# Patient Record
Sex: Male | Born: 1955 | Race: White | Hispanic: No | Marital: Single | Smoking: Current some day smoker
Health system: Southern US, Community
[De-identification: ages and names within clinical notes are randomized; demographics above are authoritative.]

## PROBLEM LIST (undated history)

## (undated) DIAGNOSIS — Z21 Asymptomatic human immunodeficiency virus [HIV] infection status: Secondary | ICD-10-CM

## (undated) DIAGNOSIS — I10 Essential (primary) hypertension: Secondary | ICD-10-CM

## (undated) DIAGNOSIS — B20 Human immunodeficiency virus [HIV] disease: Secondary | ICD-10-CM

## (undated) HISTORY — DX: Human immunodeficiency virus (HIV) disease: B20

## (undated) HISTORY — DX: Essential (primary) hypertension: I10

## (undated) HISTORY — DX: Asymptomatic human immunodeficiency virus (hiv) infection status: Z21

---

## 1998-04-18 HISTORY — PX: OTHER SURGICAL HISTORY: SHX169

## 2004-10-31 ENCOUNTER — Ambulatory Visit: Payer: Self-pay | Admitting: Infectious Diseases

## 2004-10-31 ENCOUNTER — Inpatient Hospital Stay (HOSPITAL_COMMUNITY): Admission: RE | Admit: 2004-10-31 | Discharge: 2004-11-04 | Payer: Self-pay | Admitting: Internal Medicine

## 2004-10-31 ENCOUNTER — Encounter: Payer: Self-pay | Admitting: *Deleted

## 2004-10-31 DIAGNOSIS — B957 Other staphylococcus as the cause of diseases classified elsewhere: Secondary | ICD-10-CM | POA: Insufficient documentation

## 2004-12-03 ENCOUNTER — Encounter (INDEPENDENT_AMBULATORY_CARE_PROVIDER_SITE_OTHER): Payer: Self-pay | Admitting: *Deleted

## 2004-12-03 ENCOUNTER — Ambulatory Visit: Payer: Self-pay | Admitting: Infectious Diseases

## 2004-12-26 ENCOUNTER — Inpatient Hospital Stay (HOSPITAL_COMMUNITY): Admission: EM | Admit: 2004-12-26 | Discharge: 2005-01-01 | Payer: Self-pay | Admitting: Emergency Medicine

## 2005-02-24 ENCOUNTER — Ambulatory Visit: Payer: Self-pay | Admitting: Infectious Diseases

## 2005-04-01 ENCOUNTER — Encounter (INDEPENDENT_AMBULATORY_CARE_PROVIDER_SITE_OTHER): Payer: Self-pay | Admitting: *Deleted

## 2005-04-01 ENCOUNTER — Ambulatory Visit: Payer: Self-pay | Admitting: Infectious Diseases

## 2005-04-01 ENCOUNTER — Ambulatory Visit (HOSPITAL_COMMUNITY): Admission: RE | Admit: 2005-04-01 | Discharge: 2005-04-01 | Payer: Self-pay | Admitting: Infectious Diseases

## 2005-04-01 LAB — CONVERTED CEMR LAB: HIV 1 RNA Quant: 399 copies/mL

## 2005-11-04 ENCOUNTER — Ambulatory Visit: Payer: Self-pay | Admitting: Internal Medicine

## 2005-11-04 ENCOUNTER — Encounter (INDEPENDENT_AMBULATORY_CARE_PROVIDER_SITE_OTHER): Payer: Self-pay | Admitting: *Deleted

## 2005-11-04 ENCOUNTER — Ambulatory Visit (HOSPITAL_COMMUNITY): Admission: RE | Admit: 2005-11-04 | Discharge: 2005-11-04 | Payer: Self-pay | Admitting: Internal Medicine

## 2005-11-04 ENCOUNTER — Encounter: Admission: RE | Admit: 2005-11-04 | Discharge: 2005-11-04 | Payer: Self-pay | Admitting: Internal Medicine

## 2005-11-04 LAB — CONVERTED CEMR LAB: HIV 1 RNA Quant: 49 copies/mL

## 2006-02-15 ENCOUNTER — Ambulatory Visit: Payer: Self-pay | Admitting: Internal Medicine

## 2006-03-16 ENCOUNTER — Encounter: Admission: RE | Admit: 2006-03-16 | Discharge: 2006-03-16 | Payer: Self-pay | Admitting: Internal Medicine

## 2006-03-16 ENCOUNTER — Ambulatory Visit: Payer: Self-pay | Admitting: Internal Medicine

## 2006-03-16 ENCOUNTER — Encounter (INDEPENDENT_AMBULATORY_CARE_PROVIDER_SITE_OTHER): Payer: Self-pay | Admitting: *Deleted

## 2006-03-29 ENCOUNTER — Ambulatory Visit: Payer: Self-pay | Admitting: Internal Medicine

## 2006-05-30 DIAGNOSIS — B20 Human immunodeficiency virus [HIV] disease: Secondary | ICD-10-CM

## 2006-05-30 DIAGNOSIS — I10 Essential (primary) hypertension: Secondary | ICD-10-CM | POA: Insufficient documentation

## 2006-05-30 DIAGNOSIS — K029 Dental caries, unspecified: Secondary | ICD-10-CM | POA: Insufficient documentation

## 2006-06-12 ENCOUNTER — Encounter (INDEPENDENT_AMBULATORY_CARE_PROVIDER_SITE_OTHER): Payer: Self-pay | Admitting: *Deleted

## 2006-06-12 LAB — CONVERTED CEMR LAB

## 2006-06-13 ENCOUNTER — Encounter: Payer: Self-pay | Admitting: Internal Medicine

## 2006-06-25 ENCOUNTER — Encounter (INDEPENDENT_AMBULATORY_CARE_PROVIDER_SITE_OTHER): Payer: Self-pay | Admitting: *Deleted

## 2006-08-30 ENCOUNTER — Encounter: Payer: Self-pay | Admitting: Licensed Clinical Social Worker

## 2006-08-30 ENCOUNTER — Ambulatory Visit: Payer: Self-pay | Admitting: Internal Medicine

## 2006-08-30 DIAGNOSIS — R634 Abnormal weight loss: Secondary | ICD-10-CM

## 2006-09-05 ENCOUNTER — Encounter (INDEPENDENT_AMBULATORY_CARE_PROVIDER_SITE_OTHER): Payer: Self-pay | Admitting: *Deleted

## 2006-09-25 ENCOUNTER — Telehealth: Payer: Self-pay | Admitting: Internal Medicine

## 2006-10-19 ENCOUNTER — Telehealth: Payer: Self-pay | Admitting: Internal Medicine

## 2006-10-23 ENCOUNTER — Encounter (INDEPENDENT_AMBULATORY_CARE_PROVIDER_SITE_OTHER): Payer: Self-pay | Admitting: *Deleted

## 2006-11-07 ENCOUNTER — Encounter (INDEPENDENT_AMBULATORY_CARE_PROVIDER_SITE_OTHER): Payer: Self-pay | Admitting: *Deleted

## 2006-11-08 ENCOUNTER — Ambulatory Visit: Payer: Self-pay | Admitting: Internal Medicine

## 2006-11-08 ENCOUNTER — Encounter: Admission: RE | Admit: 2006-11-08 | Discharge: 2006-11-08 | Payer: Self-pay | Admitting: Internal Medicine

## 2006-11-08 LAB — CONVERTED CEMR LAB
Alkaline Phosphatase: 114 units/L (ref 39–117)
BUN: 19 mg/dL (ref 6–23)
Creatinine, Ser: 1.19 mg/dL (ref 0.40–1.50)
Eosinophils Absolute: 0.3 10*3/uL (ref 0.0–0.7)
Eosinophils Relative: 5 % (ref 0–5)
Glucose, Bld: 78 mg/dL (ref 70–99)
HCT: 36.8 % — ABNORMAL LOW (ref 39.0–52.0)
HIV-1 RNA Quant, Log: <1.7 (ref <1.70)
Lymphs Abs: 2.8 10*3/uL (ref 0.7–3.3)
MCV: 92 fL (ref 78.0–100.0)
Platelets: 326 10*3/uL (ref 150–400)
Sodium: 140 meq/L (ref 135–145)
Total Bilirubin: 0.3 mg/dL (ref 0.3–1.2)
Total Protein: 8 g/dL (ref 6.0–8.3)
WBC: 6.3 10*3/uL (ref 4.0–10.5)

## 2006-11-21 ENCOUNTER — Telehealth: Payer: Self-pay | Admitting: Internal Medicine

## 2006-11-29 ENCOUNTER — Ambulatory Visit: Payer: Self-pay | Admitting: Internal Medicine

## 2006-12-01 ENCOUNTER — Telehealth: Payer: Self-pay | Admitting: Internal Medicine

## 2006-12-20 ENCOUNTER — Telehealth: Payer: Self-pay | Admitting: Internal Medicine

## 2007-01-05 ENCOUNTER — Telehealth: Payer: Self-pay | Admitting: Internal Medicine

## 2007-01-24 ENCOUNTER — Telehealth: Payer: Self-pay | Admitting: Internal Medicine

## 2007-02-19 ENCOUNTER — Telehealth: Payer: Self-pay | Admitting: Internal Medicine

## 2007-03-21 ENCOUNTER — Telehealth: Payer: Self-pay | Admitting: Internal Medicine

## 2007-04-18 ENCOUNTER — Ambulatory Visit: Payer: Self-pay | Admitting: Internal Medicine

## 2007-04-18 ENCOUNTER — Encounter: Admission: RE | Admit: 2007-04-18 | Discharge: 2007-04-18 | Payer: Self-pay | Admitting: Internal Medicine

## 2007-04-18 ENCOUNTER — Encounter (INDEPENDENT_AMBULATORY_CARE_PROVIDER_SITE_OTHER): Payer: Self-pay | Admitting: *Deleted

## 2007-04-18 LAB — CONVERTED CEMR LAB
CO2: 24 meq/L (ref 19–32)
Calcium: 9.9 mg/dL (ref 8.4–10.5)
Chloride: 107 meq/L (ref 96–112)
Cholesterol: 243 mg/dL — ABNORMAL HIGH (ref 0–200)
Creatinine, Ser: 1.11 mg/dL (ref 0.40–1.50)
Eosinophils Relative: 4 % (ref 0–5)
Glucose, Bld: 92 mg/dL (ref 70–99)
HCT: 37.1 % — ABNORMAL LOW (ref 39.0–52.0)
HIV-1 RNA Quant, Log: 1.87 — ABNORMAL HIGH (ref <1.70)
Hemoglobin: 12.2 g/dL — ABNORMAL LOW (ref 13.0–17.0)
Lymphocytes Relative: 35 % (ref 12–46)
Lymphs Abs: 2.3 10*3/uL (ref 0.7–4.0)
Monocytes Absolute: 0.5 10*3/uL (ref 0.1–1.0)
RBC: 3.93 M/uL — ABNORMAL LOW (ref 4.22–5.81)
Total Bilirubin: 0.3 mg/dL (ref 0.3–1.2)
Total CHOL/HDL Ratio: 5.4
Total Protein: 8.5 g/dL — ABNORMAL HIGH (ref 6.0–8.3)
Triglycerides: 134 mg/dL (ref <150)
VLDL: 27 mg/dL (ref 0–40)
WBC: 6.6 10*3/uL (ref 4.0–10.5)

## 2007-04-27 ENCOUNTER — Telehealth: Payer: Self-pay | Admitting: Internal Medicine

## 2007-05-02 ENCOUNTER — Ambulatory Visit: Payer: Self-pay | Admitting: Internal Medicine

## 2007-05-02 DIAGNOSIS — E785 Hyperlipidemia, unspecified: Secondary | ICD-10-CM | POA: Insufficient documentation

## 2007-05-24 ENCOUNTER — Telehealth: Payer: Self-pay | Admitting: Internal Medicine

## 2007-06-07 ENCOUNTER — Telehealth (INDEPENDENT_AMBULATORY_CARE_PROVIDER_SITE_OTHER): Payer: Self-pay | Admitting: *Deleted

## 2007-06-21 ENCOUNTER — Telehealth: Payer: Self-pay | Admitting: Internal Medicine

## 2007-07-16 ENCOUNTER — Telehealth (INDEPENDENT_AMBULATORY_CARE_PROVIDER_SITE_OTHER): Payer: Self-pay | Admitting: *Deleted

## 2007-08-02 ENCOUNTER — Encounter: Admission: RE | Admit: 2007-08-02 | Discharge: 2007-08-02 | Payer: Self-pay | Admitting: Infectious Disease

## 2007-08-02 ENCOUNTER — Encounter: Payer: Self-pay | Admitting: Internal Medicine

## 2007-08-02 ENCOUNTER — Ambulatory Visit: Payer: Self-pay | Admitting: Infectious Disease

## 2007-08-02 LAB — CONVERTED CEMR LAB
ALT: 12 units/L (ref 0–53)
BUN: 9 mg/dL (ref 6–23)
Basophils Absolute: 0 10*3/uL (ref 0.0–0.1)
CO2: 25 meq/L (ref 19–32)
Calcium: 9 mg/dL (ref 8.4–10.5)
Chloride: 108 meq/L (ref 96–112)
Cholesterol: 191 mg/dL (ref 0–200)
Creatinine, Ser: 1.06 mg/dL (ref 0.40–1.50)
Eosinophils Relative: 3 % (ref 0–5)
Glucose, Bld: 101 mg/dL — ABNORMAL HIGH (ref 70–99)
HCT: 36.4 % — ABNORMAL LOW (ref 39.0–52.0)
HDL: 41 mg/dL (ref >39)
HIV-1 RNA Quant, Log: 2.41 — ABNORMAL HIGH (ref <1.70)
Hemoglobin: 11.8 g/dL — ABNORMAL LOW (ref 13.0–17.0)
Lymphocytes Relative: 29 % (ref 12–46)
Monocytes Absolute: 0.6 10*3/uL (ref 0.1–1.0)
Monocytes Relative: 9 % (ref 3–12)
Neutro Abs: 3.9 10*3/uL (ref 1.7–7.7)
RBC: 3.77 M/uL — ABNORMAL LOW (ref 4.22–5.81)
RDW: 13.5 % (ref 11.5–15.5)
Total Bilirubin: 0.3 mg/dL (ref 0.3–1.2)
Total CHOL/HDL Ratio: 4.7
Triglycerides: 144 mg/dL (ref <150)
VLDL: 29 mg/dL (ref 0–40)

## 2007-08-17 ENCOUNTER — Ambulatory Visit: Payer: Self-pay | Admitting: Internal Medicine

## 2008-07-02 ENCOUNTER — Ambulatory Visit: Payer: Self-pay | Admitting: Internal Medicine

## 2008-07-02 LAB — CONVERTED CEMR LAB
Albumin: 4.2 g/dL (ref 3.5–5.2)
Alkaline Phosphatase: 93 units/L (ref 39–117)
BUN: 14 mg/dL (ref 6–23)
Basophils Absolute: 0 10*3/uL (ref 0.0–0.1)
Calcium: 9.7 mg/dL (ref 8.4–10.5)
Chloride: 108 meq/L (ref 96–112)
Creatinine, Ser: 1.12 mg/dL (ref 0.40–1.50)
Eosinophils Absolute: 0.2 10*3/uL (ref 0.0–0.7)
Eosinophils Relative: 2 % (ref 0–5)
Glucose, Bld: 88 mg/dL (ref 70–99)
HCT: 36.5 % — ABNORMAL LOW (ref 39.0–52.0)
HDL: 33 mg/dL — ABNORMAL LOW (ref >39)
Hemoglobin: 12 g/dL — ABNORMAL LOW (ref 13.0–17.0)
Lymphocytes Relative: 37 % (ref 12–46)
MCV: 92.4 fL (ref 78.0–100.0)
Monocytes Absolute: 0.7 10*3/uL (ref 0.1–1.0)
Platelets: 366 10*3/uL (ref 150–400)
Potassium: 4.4 meq/L (ref 3.5–5.3)
RDW: 13.1 % (ref 11.5–15.5)
Total CHOL/HDL Ratio: 6.6
Triglycerides: 243 mg/dL — ABNORMAL HIGH (ref <150)

## 2008-07-22 ENCOUNTER — Ambulatory Visit: Payer: Self-pay | Admitting: Internal Medicine

## 2008-10-28 ENCOUNTER — Encounter (INDEPENDENT_AMBULATORY_CARE_PROVIDER_SITE_OTHER): Payer: Self-pay | Admitting: *Deleted

## 2008-10-28 ENCOUNTER — Ambulatory Visit: Payer: Self-pay | Admitting: Internal Medicine

## 2008-10-28 LAB — CONVERTED CEMR LAB
Albumin: 4 g/dL (ref 3.5–5.2)
BUN: 13 mg/dL (ref 6–23)
Basophils Relative: 1 % (ref 0–1)
CO2: 23 meq/L (ref 19–32)
Calcium: 9.1 mg/dL (ref 8.4–10.5)
Cholesterol: 206 mg/dL — ABNORMAL HIGH (ref 0–200)
Glucose, Bld: 86 mg/dL (ref 70–99)
HDL: 40 mg/dL (ref >39)
HIV-1 RNA Quant, Log: 3.18 — ABNORMAL HIGH (ref <1.68)
Hemoglobin: 12.9 g/dL — ABNORMAL LOW (ref 13.0–17.0)
Lymphocytes Relative: 54 % — ABNORMAL HIGH (ref 12–46)
Lymphs Abs: 3 10*3/uL (ref 0.7–4.0)
Monocytes Relative: 9 % (ref 3–12)
Neutro Abs: 1.7 10*3/uL (ref 1.7–7.7)
Neutrophils Relative %: 31 % — ABNORMAL LOW (ref 43–77)
Potassium: 4.5 meq/L (ref 3.5–5.3)
RBC: 4.25 M/uL (ref 4.22–5.81)
Sodium: 143 meq/L (ref 135–145)
Total CHOL/HDL Ratio: 5.2
Total Protein: 7.8 g/dL (ref 6.0–8.3)
Triglycerides: 145 mg/dL (ref <150)
WBC: 5.6 10*3/uL (ref 4.0–10.5)

## 2008-11-14 ENCOUNTER — Ambulatory Visit: Payer: Self-pay | Admitting: Internal Medicine

## 2008-11-20 ENCOUNTER — Encounter (INDEPENDENT_AMBULATORY_CARE_PROVIDER_SITE_OTHER): Payer: Self-pay | Admitting: *Deleted

## 2009-04-06 ENCOUNTER — Ambulatory Visit: Payer: Self-pay | Admitting: Internal Medicine

## 2009-04-06 LAB — CONVERTED CEMR LAB
AST: 21 units/L (ref 0–37)
Albumin: 4.2 g/dL (ref 3.5–5.2)
Basophils Absolute: 0.1 10*3/uL (ref 0.0–0.1)
Basophils Relative: 1 % (ref 0–1)
CO2: 24 meq/L (ref 19–32)
Chloride: 108 meq/L (ref 96–112)
Creatinine, Ser: 1.16 mg/dL (ref 0.40–1.50)
Eosinophils Absolute: 0.2 10*3/uL (ref 0.0–0.7)
Eosinophils Relative: 4 % (ref 0–5)
Glucose, Bld: 90 mg/dL (ref 70–99)
HIV 1 RNA Quant: <48 copies/mL (ref <48)
Hemoglobin: 12.7 g/dL — ABNORMAL LOW (ref 13.0–17.0)
MCHC: 33.5 g/dL (ref 30.0–36.0)
MCV: 94.3 fL (ref >=78.0)
Monocytes Absolute: 0.5 10*3/uL (ref 0.1–1.0)
Monocytes Relative: 10 % (ref 3–12)
Neutro Abs: 2.4 10*3/uL (ref 1.7–7.7)
RBC: 4.02 M/uL — ABNORMAL LOW (ref 4.22–5.81)
RDW: 13.4 % (ref 11.5–15.5)

## 2009-04-24 ENCOUNTER — Ambulatory Visit: Payer: Self-pay | Admitting: Internal Medicine

## 2009-05-21 ENCOUNTER — Telehealth: Payer: Self-pay | Admitting: Internal Medicine

## 2009-05-26 ENCOUNTER — Telehealth (INDEPENDENT_AMBULATORY_CARE_PROVIDER_SITE_OTHER): Payer: Self-pay | Admitting: *Deleted

## 2009-06-18 ENCOUNTER — Telehealth (INDEPENDENT_AMBULATORY_CARE_PROVIDER_SITE_OTHER): Payer: Self-pay | Admitting: *Deleted

## 2009-07-21 ENCOUNTER — Telehealth (INDEPENDENT_AMBULATORY_CARE_PROVIDER_SITE_OTHER): Payer: Self-pay | Admitting: *Deleted

## 2009-07-23 ENCOUNTER — Telehealth (INDEPENDENT_AMBULATORY_CARE_PROVIDER_SITE_OTHER): Payer: Self-pay | Admitting: *Deleted

## 2009-07-23 ENCOUNTER — Ambulatory Visit: Payer: Self-pay | Admitting: Internal Medicine

## 2009-08-12 ENCOUNTER — Ambulatory Visit: Payer: Self-pay | Admitting: Internal Medicine

## 2010-03-08 ENCOUNTER — Encounter (INDEPENDENT_AMBULATORY_CARE_PROVIDER_SITE_OTHER): Payer: Self-pay | Admitting: *Deleted

## 2010-04-07 ENCOUNTER — Ambulatory Visit: Payer: Self-pay | Admitting: Internal Medicine

## 2010-04-23 ENCOUNTER — Ambulatory Visit: Admit: 2010-04-23 | Payer: Self-pay | Admitting: Internal Medicine

## 2010-05-16 LAB — CONVERTED CEMR LAB
AST: 18 units/L (ref 0–37)
Albumin: 4.4 g/dL (ref 3.5–5.2)
BUN: 20 mg/dL (ref 6–23)
CO2: 26 meq/L (ref 19–32)
Calcium: 9.4 mg/dL (ref 8.4–10.5)
Chloride: 108 meq/L (ref 96–112)
Creatinine, Ser: 1.06 mg/dL (ref 0.40–1.50)
Eosinophils Absolute: 0.3 10*3/uL (ref 0.0–0.7)
Eosinophils Relative: 5 % (ref 0–5)
HCT: 38.1 % — ABNORMAL LOW (ref 39.0–52.0)
HIV-1 RNA Quant, Log: <1.68 (ref <1.68)
Hemoglobin: 12.4 g/dL — ABNORMAL LOW (ref 13.0–17.0)
Lymphocytes Relative: 38 % (ref 12–46)
Lymphs Abs: 2 10*3/uL (ref 0.7–4.0)
MCV: 95.7 fL (ref 78.0–100.0)
Monocytes Absolute: 0.5 10*3/uL (ref 0.1–1.0)
Monocytes Relative: 9 % (ref 3–12)
Platelets: 302 10*3/uL (ref 150–400)
Potassium: 4.3 meq/L (ref 3.5–5.3)
RBC: 3.98 M/uL — ABNORMAL LOW (ref 4.22–5.81)
WBC: 5.4 10*3/uL (ref 4.0–10.5)

## 2010-05-18 NOTE — Assessment & Plan Note (Signed)
Summary: 60month f/u/vs   CC:  follow-up visit, lab results, pt. out of B/P meds x 1 week, and will pick up today.  History of Present Illness: Pt here for f/u on labs. No missed doses of his Atripla. He is feeling well.  Preventive Screening-Counseling & Management  Alcohol-Tobacco     Alcohol drinks/day: 0     Smoking Status: never  Caffeine-Diet-Exercise     Caffeine use/day: tea occassionally     Does Patient Exercise: yes     Type of exercise: walking     Exercise (avg: min/session): >60     Times/week: 4  Safety-Violence-Falls     Seat Belt Use: yes      Sexual History:  n/a.     Updated Prior Medication List: ATRIPLA 600-200-300 MG TABS (EFAVIRENZ-EMTRICITAB-TENOFOVIR) Take 1 tablet by mouth once a day ATENOLOL 100 MG  TABS (ATENOLOL) Take 1 tablet by mouth once a day  Current Allergies (reviewed today): No known allergies  Past History:  Past Medical History: Last updated: 05/30/2006 HIV disease Hypertension MRSA, hx of  (10/2004) Dental caries  Social History: Sexual History:  n/a  Review of Systems  The patient denies anorexia, fever, and weight loss.    Vital Signs:  Patient profile:   55 year old male Height:      71 inches (180.34 cm) Weight:      160.8 pounds (73.09 kg) BMI:     22.51 Temp:     97.9 degrees F (36.61 degrees C) oral Pulse rate:   83 / minute BP sitting:   167 / 97  (right arm)  Vitals Entered By: Wendall Mola CMA Duncan Dull) (August 12, 2009 10:41 AM) CC: follow-up visit, lab results, pt. out of B/P meds x 1 week, will pick up today Is Patient Diabetic? No Pain Assessment Patient in pain? no      Nutritional Status BMI of 19 -24 = normal Nutritional Status Detail appetite "great"  Does patient need assistance? Functional Status Self care Ambulation Normal Comments no missed doses of HIV med per patient   Physical Exam  General:  alert, well-developed, well-nourished, and well-hydrated.   Head:  normocephalic  and atraumatic.   Mouth:  pharynx pink and moist.   Lungs:  normal breath sounds.      Impression & Recommendations:  Problem # 1:  HIV DISEASE (ICD-042) Pt.s most recent CD4ct was 550 and VL <48 .  Pt instructed to continue the current antiretroviral regimen.  Pt encouraged to take medication regularly and not miss doses.  Pt will f/u in 3 months for repeat blood work and will see me 2 weeks later.  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 Take 1 tablet by mouth once a day  Other Orders: Est. Patient Level III ([address]) Future Orders: T-CD4SP ([facility]) (CD4SP) ... 11/10/2009 T-HIV Viral Load [id number]) ... 11/10/2009 T-Comprehensive Metabolic Panel [id number]) ... 11/10/2009 T-CBC w/Diff ([id number]) ... 11/10/2009 T-RPR (Syphilis) [id number]) ... 11/10/2009  Patient Instructions: 1)  Please schedule a follow-up appointment in 3 months, 2 weeks  after labs.

## 2010-05-18 NOTE — Progress Notes (Signed)
Summary: [facility]/pt assist med arrived to clinic for Feb  Phone Note Refill Request      Prescriptions: ATRIPLA 600-200-300 MG TABS (EFAVIRENZ-EMTRICITAB-TENOFOVIR) Take 1 tablet by mouth once a day  #30 x 0   Entered by:   Paulo Fruit  BS,CPht II,MPH   Authorized by:   Yisroel Ramming MD   Signed by:   Paulo Fruit  BS,CPht II,MPH on 05/26/2009   Method used:   Samples Given   RxID:   [id number]  **[facility] ([facility]) was unable to reach patient, so they sent his medication to the clinic.**  Tried to contact patient once again; was unable to leave messages this time because his mail box was full at the time called.**   Patient Assist Medication Verification: Medication: Atripla Lot# [id number] Exp Date:07 2013 Tech approval:MLD  **Patient also needs to renew for NCADAP by end of March to continue receiving medications**  Paulo Fruit  BS,CPht II,MPH  May 26, 2009 2:52 PM

## 2010-05-18 NOTE — Progress Notes (Signed)
Summary: [facility]AP/pt assist med arrived for Apr  Phone Note Refill Request      Prescriptions: ATRIPLA 600-200-300 MG TABS (EFAVIRENZ-EMTRICITAB-TENOFOVIR) Take 1 tablet by mouth once a day  #30 x 0   Entered by:   Paulo Fruit  BS,CPht II,MPH   Authorized by:   Yisroel Ramming MD   Signed by:   Paulo Fruit  BS,CPht II,MPH on 07/21/2009   Method used:   Samples Given   RxID:   [id number]   Patient Assist Medication Verification: Medication: Atripla EXB#[id number] Exp Date:07 2013 Tech approval:MLD               Call placed to patient with message that assistance medications are ready for pick-up. Patient made an appt to see Byrd Hesselbach to recertify for ADAP and RW on Thursday, 10 am 07/23/09 Paulo Fruit  BS,CPht II,MPH  July 21, 2009 3:57 PM

## 2010-05-18 NOTE — Miscellaneous (Signed)
  Clinical Lists Changes  Observations: Added new observation of YEARAIDSPOS: 2006  (03/08/2010 10:56)

## 2010-05-18 NOTE — Progress Notes (Signed)
Summary: [facility]/pt assist med arrived for Mar  Phone Note Refill Request      Prescriptions: ATRIPLA 600-200-300 MG TABS (EFAVIRENZ-EMTRICITAB-TENOFOVIR) Take 1 tablet by mouth once a day  #30 x 0   Entered by:   Paulo Fruit  BS,CPht II,MPH   Authorized by:   Yisroel Ramming MD   Signed by:   Paulo Fruit  BS,CPht II,MPH on 06/18/2009   Method used:   Samples Given   RxID:   [id number]   Patient Assist Medication Verification: Medication: Atripla Lot# [id number] Exp Date:02 2013 Tech approval:MLD **Patient has yet to pick up medication from last month that was returned via UPS to [facility] because patient moved and did not inform anyone.** Call placed to patient with message that assistance medications are ready for pick-up. Paulo Fruit  BS,CPht II,MPH  June 18, 2009 8:14 AM

## 2010-05-18 NOTE — Progress Notes (Signed)
Summary: NC[employer]AP and RW incomplete at this time. addi'tl info required  Patient came to complete applicatons for RW and ADAP, however, did not bring all necessary required documentation.  [name], July 27, 2009 to complete his process. [name]  BS,CPht II,MPH  July 23, 2009 10:38 AM

## 2010-05-18 NOTE — Assessment & Plan Note (Signed)
Summary: R/S FROM 04-21-09 FOR F/U VISIT/CH   CC:  f/u labs.  History of Present Illness: Pt feeling well. No missed doses of his Atripla.  He never got his BP medication filled due to financial issues.  Preventive Screening-Counseling & Management  Alcohol-Tobacco     Alcohol drinks/day: 0     Smoking Status: never  Caffeine-Diet-Exercise     Caffeine use/day: tea occassionally     Does Patient Exercise: yes     Type of exercise: walking     Exercise (avg: min/session): >60     Times/week: 4   Updated Prior Medication List: ATRIPLA 600-200-300 MG TABS (EFAVIRENZ-EMTRICITAB-TENOFOVIR) Take 1 tablet by mouth once a day MEGACE ORAL 40 MG/ML SUSP (MEGESTROL ACETATE) take 10ml by mouth once a day ATENOLOL 100 MG  TABS (ATENOLOL) Take 1 tablet by mouth once a day  Current Allergies: No known allergies  Past History:  Past Medical History: Last updated: 05/30/2006 HIV disease Hypertension MRSA, hx of  (10/2004) Dental caries  Review of Systems  The patient denies anorexia, fever, weight loss, chest pain, and headaches.    Vital Signs:  Patient profile:   55 year old male Weight:      163 pounds (74.09 kg) BMI:     22.82 Temp:     97.2 degrees F (36.22 degrees C) oral Pulse rate:   105 / minute BP sitting:   170 / 100  (left arm)  Vitals Entered By: Starleen Arms CMA (April 24, 2009 3:55 PM) CC: f/u labs Is Patient Diabetic? No Pain Assessment Patient in pain? no      Nutritional Status BMI of 19 -24 = normal Nutritional Status Detail nl  Does patient need assistance? Functional Status Self care Ambulation Normal   Physical Exam  General:  alert, well-developed, well-nourished, and well-hydrated.   Head:  normocephalic and atraumatic.   Lungs:  normal breath sounds.   Heart:  normal rate and regular rhythm.      Impression & Recommendations:  Problem # 1:  HIV DISEASE (ICD-042) Pt.s most recent CD4ct was 550 and VL <48 .  Pt instructed to  continue the current antiretroviral regimen.  Pt encouraged to take medication regularly and not miss doses.  Pt will f/u in 3 months for repeat blood work and will see me 2 weeks later.  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 His updated medication list for this problem includes:    Atenolol 100 Mg Tabs (Atenolol) .Marland Kitchen... Take 1 tablet by mouth once a day  Other Orders: Est. Patient Level III (09811) Future Orders: T-CD4SP ([facility]) (CD4SP) ... 07/23/2009 T-HIV Viral Load [id number]) ... 07/23/2009 T-Comprehensive Metabolic Panel [id number]) ... 07/23/2009 T-CBC w/Diff ([id number]) ... 07/23/2009  Patient Instructions: 1)  Please schedule a follow-up appointment in 3 months, 2 weeks after labs.  Prescriptions: ATENOLOL 100 MG  TABS (ATENOLOL) Take 1 tablet by mouth once a day  #30 x 5   Entered and Authorized by:   Yisroel Ramming MD   Signed by:   Yisroel Ramming MD on 04/24/2009   Method used:   Print then Give to Patient  RxID:   [id number]  Process Orders Check Orders Results:     [facility]: ABN not required for this insurance Tests Sent for requisitioning (April 24, 2009 4:25 PM):     07/23/2009: [facility] -- T-HIV Viral Load [phone] (signed)     07/23/2009: [facility] -- T-Comprehensive Metabolic Panel [[id number]] (signed)     07/23/2009: [facility] -- T-CBC w/Diff [[id number]] (signed)    Medication Administration  Medication # 1:    Medication: Clonidine 0.1mg  tab    Diagnosis: HYPERTENSION (ICD-401.9)    Dose: 1 tablet    Route: po    Exp Date:  06/17/2010    Lot #: [id number]    Mfr: [employer]    Patient tolerated medication without complications    Given by: Tomasita Morrow RN (April 24, 2009 4:21 PM)  Orders Added: 1)  T-CD4SP [facility]) [CD4SP] 2)  T-HIV Viral Load [id number] 3)  T-Comprehensive Metabolic Panel [[id number]] 4)  T-CBC w/Diff [[id number]] 5)  Est. Patient Level III [73220]

## 2010-05-18 NOTE — Progress Notes (Signed)
Summary: Med pkg returned to sende-pt. moved w/no update info given  Phone Note Outgoing Call   Call placed by: Paulo Fruit  BS,CPht II,MPH,  May 21, 2009 3:58 PM Call placed to: Patient Summary of Call: Left a message on phone number on file for patient to call the office.  Additional infomation is needed. Initial call taken by: Paulo Fruit  BS,CPht II,MPH,  May 21, 2009 3:58 PM Caller: Jocelyn Lamer Summary of Call: Received a voicemail message stating that patient's medication was returned by UPS with a note that patient has moved.  Pharmacy is needing the update information so they can resend his medication. Initial call taken by: Paulo Fruit  BS,CPht II,MPH,  May 21, 2009 3:58 PM

## 2010-07-07 LAB — T-HELPER CELL (CD4) - (RCID CLINIC ONLY): CD4 T Cell Abs: 550 uL (ref 400–2700)

## 2010-07-19 LAB — T-HELPER CELL (CD4) - (RCID CLINIC ONLY): CD4 T Cell Abs: 550 uL (ref 400–2700)

## 2010-07-29 LAB — T-HELPER CELL (CD4) - (RCID CLINIC ONLY)
CD4 % Helper T Cell: 30 % — ABNORMAL LOW (ref 33–55)
CD4 T Cell Abs: 810 uL (ref 400–2700)

## 2010-09-03 NOTE — Discharge Summary (Signed)
NAMEBRITTIN, [name]                ACCOUNT NO.:  [id number]   MEDICAL RECORD NO.:  [id number]          PATIENT TYPE:  INP   LOCATION:  5028                         FACILITY:  [facility]   PHYSICIAN:  Jonna L. Robb Matar, M.D.DATE OF BIRTH:  12-08-1955   DATE OF ADMISSION:  10/31/2004  DATE OF DISCHARGE:  11/03/2004                                 DISCHARGE SUMMARY   FINAL DIAGNOSES:  1.  Methicillin-resistant Staphylococcus aureus abscess on the right inner      thigh.  2.  HIV positive.  3.  AIDS wasting syndrome.  4.  Mild anemia.   CONSULTATIONS:  Infectious Disease.   ALLERGIES:  None.   CODE STATUS:  Full.   HISTORY OF PRESENT ILLNESS:  This 55 year old HIV positive, African American  male, reported a blister on his right thigh that has gotten bigger and  bigger.  He has intermittently been on antiretrovirals but not at present.  He came to the emergency room and was found to be febrile with this abscess.  He has been trying to use marijuana to keep his appetite up.   PHYSICAL EXAMINATION:  VITAL SIGNS:  Temperature 100.7, heart rate 129.  GENERAL APPEARANCE:  The patient was in pain.  There is an open wound with  serosanguineous drainage on the right side with surrounding erythema  induration and tenderness.  No other lesions were seen.  Potassium was 3.3.  White count 6.   HOSPITAL COURSE:  The patient was put on IV antibiotics and was seen by  wound team.  The abscess was draining.  He was seen by Infectious Disease.  Evaluation showed hepatitis C antibody negative, CD4 count was 40.  Wound  grew out MRSA.  Viral load was greater than 10,000.  He responded to the  Unasyn and doxycycline and was told by the Wound Care Team how to dress the  wound.   DISPOSITION:  The patient is to be seen in the Infectious Disease Clinic  December 03, 2004.  We will try to get a Medicaid application.   DISCHARGE MEDICATIONS:  1.  Megace 1200 mg once daily.  2.  Bactrim DS one tablet  q.Monday, Wednesday, Friday.  3.  Doxycycline 100 mg b.i.d. x2 weeks.      Jonna L. Robb Matar, M.D.  Electronically Signed     JLB/MEDQ  D:  03/24/2005  T:  03/24/2005  Job:  [id number]

## 2010-09-03 NOTE — Discharge Summary (Signed)
Gordon, Andre                ACCOUNT NO.:  [id number]   MEDICAL RECORD NO.:  [id number]          PATIENT TYPE:  INP   LOCATION:  1324                         FACILITY:  [facility]   PHYSICIAN:  Jonna L. Robb Matar, M.D.DATE OF BIRTH:  1955-10-04   DATE OF ADMISSION:  12/26/2004  DATE OF DISCHARGE:  01/01/2005                                 DISCHARGE SUMMARY   INFECTIOUS DISEASE:  Fransisco Hertz, M.D.   SURGEON:  Adolph Pollack, M.D.   FINAL DIAGNOSES:  MRSA perineal abscesses, HIV, thrush, AIDS associated  anorexia.   ALLERGIES:  None.   CODE STATUS:  Full.   PROCEDURE:  I&D of abscesses on September 11.   HISTORY:  This 55 year old HIV positive male has had a previous MRSA abscess  in his right thigh in July and then over this past week developed painful  boils on his scrotum, groin and intertriginous area in the buttocks  accompanied by fever and sweats. He was diagnosed with HIV in 2000, had been  on antiretroviral therapy but has not been on any since losing his health  insurance. He is in the process of going through the ADAP program.   PHYSICAL EXAMINATION:  Notable for thrush, poor dentition, boils in the  scrotal area which were draining. He is generally thin but has put on weight  since July.   HOSPITAL COURSE:  The patient was seen in consultation by Dr. Abbey Chatters who  drained a couple of the cysts. He was started on vancomycin and Diflucan and  had good resolution of his fever and some of the drainage. His MRSA was  sensitive orally to Septra and clindamycin but not to tetracycline.   DISPOSITION:  The patient will be discharged on Diflucan 100 daily for 2  weeks, Zithromax 600 mg, 2 pills every Sunday, Septra DS t.i.d. for 1 month  and go back to daily, Megace 400 mg daily, multivitamin, Percocet 5/325  b.i.d.  p.r.n. He is to wash with antibacterial soap. He is to soak in warm water  once or twice a day to keep the abscesses draining. He is to wear a  protective pad. He is to continue to fill out the paperwork so he can get  connected with the Infectious Disease Clinic. He is to see Dr. Abbey Chatters in  one week.      Jonna L. Robb Matar, M.D.  Electronically Signed     JLB/MEDQ  D:  01/01/2005  T:  01/03/2005  Job:  [id number]   cc:   Fransisco Hertz, M.D.  [address]  [place] [address]  Fax: [phone]   Adolph Pollack, M.D.  [address], [address]  [place]  [place] [address]

## 2010-09-03 NOTE — Op Note (Signed)
[name], [name]                ACCOUNT NO.:  [id number]   MEDICAL RECORD NO.:  [id number]          PATIENT TYPE:  INP   LOCATION:  1324                         [facility]ACILITY:  [facility]   PHYSICIAN:  Adolph Pollack, M.D.DATE OF BIRTH:  05-Oct-1955   DATE OF PROCEDURE:  12/27/2004  DATE OF DISCHARGE:                                 OPERATIVE REPORT   PREOPERATIVE DIAGNOSES:  Multiple scrotal, groin and perianal abscesses.  Perianal and buttock abscess.   POSTOPERATIVE DIAGNOSES:  Multiple scrotal, groin and perianal abscesses.  Perianal and buttock abscess.   PROCEDURE:  1.  Incision and drainage of right scrotal abscess.  2.  Incision and drainage of right groin abscess.  3.  Debridement of skin and soft tissue of right perianal abscess.  4.  Incision and drainage of right buttock abscess.  5.  Incision and drainage of medial left buttock abscess.  6.  Incision and drainage of left lateral buttock abscess.   SURGEON:  Adolph Pollack, M.D.   ANESTHESIA:  2% local.   TECHNIQUE:  The patient was in the supine position. The right groin and  scrotal abscesses were prepped with Betadine and anesthetized with  lidocaine. Cruciate incisions were made over the fluctuant areas, purulent  material evacuated, some of which was collected for culture. The wounds were  then packed with a dry gauze.   The patient was then placed in the lateral Sims position. A spontaneously  draining right perianal abscess was noted and necrotic tissue debrided  sharply from it. In the right lateral buttock area, there was a fluctuant  area that was anesthetized and prepped with Betadine. A cruciate incision  was made here and purulent material evacuated and it was packed with dry  gauze. In the left lateral buttock area, there was a large fluctuant area  that was prepped with Betadine and anesthetized. A cruciate incision was  made here and a moderate amount of purulent material drained and the wound  was packed with dry gauze. Medial to this, there was a smaller abscess that  was sterilely prepped and draped and anesthetized. A cruciate incision was  made over the fluctuant area, purulent material evacuated and was packed  with dry gauze. Bulky dressings were applied. He tolerated the procedure  well. He will need to have warm water soaks b.i.d. and daily examinations.      Adolph Pollack, M.D.  Electronically Signed     TJR/MEDQ  D:  12/27/2004  T:  12/27/2004  Job:  [id number]

## 2010-09-03 NOTE — Consult Note (Signed)
[name], [name]                ACCOUNT NO.:  [id number]   MEDICAL RECORD NO.:  [id number]          PATIENT TYPE:  INP   LOCATION:  1324                         [facility]ACILITY[facility]  [facility]   PHYSICIAN:  Adolph Pollack, M.D.DATE OF BIRTH:  1955/12/22   DATE OF CONSULTATION:  DATE OF DISCHARGE:                                   CONSULTATION   REQUESTING PHYSICIAN:  Hillery Aldo, M.D., [facility].   HISTORY OF PRESENT ILLNESS:  This is a 55 year old male with AIDS and middle-  low CD4 count.  He underwent treatment of MRSA resistant staph aureus  infection to his right thigh in mid July.  The week before admission, he  developed painful areas in his scrotum, groin folds, and around the buttock  as well as fever and night sweats.  Subsequently, he was found to have a  number of nodular areas consistent with spontaneous draining abscesses and  had a spontaneous drain in the groin area.  He was immediately placed on  vancomycin, and I was subsequently asked to see him to drain some of the  drained abscess areas.   PAST MEDICAL HISTORY:  1.  HIV with AIDS diagnosed in 2002.  2.  Right thigh MRSA abscess.  3.  Arthritis.   PAST SURGICAL HISTORY:  Removal/excision of cyst on buttock area.   DRUG ALLERGIES:  None.   HOME MEDICATIONS:  None.  Apparently was not able to perform this retroviral  therapy.  Currently, in the hospital, he is on vancomycin and Diflucan for  antibiotic coverage.  He is also taking Zithromax and Bactrim.   REVIEW OF SYSTEMS:  CARDIOVASCULAR:  He states that he has hypertension but  has not been taking any medication for it.  No heart disease.  PULMONARY:  He denies any asthma or chronic lung disease.  GI:  Denies hepatitis, peptic  ulcer disease.  GU:  Denies any urinary problems.  ENDOCRINE:  Denies  diabetes or thyroid disease.   PHYSICAL EXAMINATION:  VITAL SIGNS:  He is about 5 feet 11 inches tall and  weighs 155 pounds.  His maximum temperature today  has been 99 degrees.  Blood pressure 126/80, pulse 99.  GENERAL:  A thin male in no acute distress.  Pleasant and cooperative.  LUNGS/RESPIRATORY:  HEART/CARDIOVASCULAR:  ABDOMEN:  Soft.  No obvious abdominal wall abscesses.  GU:  There are multiple bilateral inner thigh and groin nodules, most of  which have spontaneously drained or show evidence of spontaneous drainage.  There is a right scrotal fluctuant area that has not been drained and one on  the right thigh as well.  Patient states that all of these are somewhat  improved.  RECTAL:  In the right perianal region, there is an open wound with some  drainage of purulent material and necrotic debris noted.   IMPRESSION:  Multiple methicillin-resistant staphylococcus aureus groin,  scrotal, and perianal abscesses, most of which have spontaneously drain,  some of which have not.   PLAN:  Will perform incision and drainage of abscess in the right thigh and  one in the  scrotal area that has not spontaneously drained.  We will debride  the perianal wound.  Start on warm water soaks, and continue the  antibiotics.      Adolph Pollack, M.D.  Electronically Signed     TJR/MEDQ  D:  12/27/2004  T:  12/27/2004  Job:  [id number]   cc:   Fransisco Hertz, M.D.  [address]  [place] [address]  Fax: [phone]   Hillery Aldo, M.D.

## 2010-09-03 NOTE — H&P (Signed)
[name], [name] NO.:  [id number]   MEDICAL RECORD NO.:  [id number]          PATIENT TYPE:  EMS   LOCATION:  ED                           FACILITY:  [facility]   PHYSICIAN:  Hettie Holstein, D.O.    DATE OF BIRTH:  1955-11-02   DATE OF ADMISSION:  10/31/2004  DATE OF DISCHARGE:                                HISTORY & PHYSICAL   PRIMARY CARE PHYSICIAN:  Unassigned to [place]. He does, however, have a Dr.  Dolores Patty in [place] that he sees there.   CHIEF COMPLAINT:  Spider bite.   HISTORY OF PRESENT ILLNESS:  [name] is a pleasant, single African  American male age 55 with a history of HIV infection treated by Dr. Dolores Patty  in [place] who reports a blister appearing on his right thigh this  past Monday. He states he was fishing in [place], [place] when he  first noticed this the evening following. He stated that this had gotten  bigger, became quite red. He has no known AIDS-defining illnesses to his  recollection. He has been on antiretroviral therapies, though he does not  know the dosages or the names of these medications and certainly a question  of compliance is an issue.   In any event, in the emergency department, he was found by Dr. Stacie Acres to be  febrile and had concerns for cellulitis, infected wound and was being  admitted for further treatment.   PAST MEDICAL HISTORY:  Denies previous history of diabetes. He is HIV  positive. He does not know his last CD4 or viral load. He wishes to keep  this diagnosis and HIV status discreet as his family does not know of this  diagnosis. He has no known history of hepatitis C.   PAST SURGICAL HISTORY:  Has no previous history of surgeries in the past.   MEDICATIONS:  As described above. He has been on medications for his HIV but  he cannot recall these. There have been some recent changes.   ALLERGIES:  There are no known drug allergies.   SOCIAL HISTORY:  He lives alone. He has no children and is  not married.  Smokes marijuana. Does not smoke tobacco and does not drink alcohol.   FAMILY HISTORY:  Noncontributory.   REVIEW OF SYSTEMS:  He states his weight has been stable. His appetite has  been fair but does use marijuana to stimulate his appetite to keep his  weight up. He denies any fevers, chills, night sweats, nausea, vomiting,  diarrhea. Further review of systems is unremarkable.   PHYSICAL EXAMINATION:  VITAL SIGNS:  Vital signs reviewed. His temperature  is 100.7, blood pressure 158/97, heart rate 129, respirations 18. O2  saturation 100% on room air.  GENERAL:  The patient is an alert, pleasant African American male in no  acute distress though in quite a bit of discomfort due to the pain in his  thigh.  HEENT:  Normocephalic and atraumatic. Extraocular movements intact.  Oropharynx is clear without exudate.  NECK:  Supple, nontender. No palpable thyromegaly or mass.  CARDIOVASCULAR:  Normal  S1 and S2 without S3 or S4.  LUNGS:  Clear to auscultation bilaterally. Normal effort. There is no  dullness to percussion.  ABDOMEN:  Soft and nontender. No palpable hepatosplenomegaly or masses.  EXTREMITIES:  Anterior right thigh reveals about a dime-sized open wound  with serosanguineous drainage, surrounding erythema, induration, and mild  tenderness. No flocculate is palpated. He has no other discernible lesions  elsewhere.  NEUROLOGICAL:  No focal neurologic deficits.   LABORATORY DATA:  Sodium 136, potassium 3.3. WBC of 6, hemoglobin 11.4,  platelet count 239,000, MCV of 89. BUN 10, creatinine 1.1. Glucose 102.   ASSESSMENT:  1.  Right thigh cellulitis.  2.  Possible abscess.  3.  Human immunodeficiency virus positive status.  4.  Fever.   PLAN:  We are going to obtain blood cultures in the emergency department,  administer IV antibiotics to cover selenotic in addition to cover strep as  well as anaerobes. We will also cover community-acquired staphylococcus. We   will follow his course clinically. Perhaps have surgery to have a look as  well as the wound care team, especially if he does not exhibit improvement  with IV antibiotics. He may need further surgical debridement.       ESS/MEDQ  D:  10/31/2004  T:  10/31/2004  Job:  [id number]   cc:   Dolores Patty, M.D.  [place], [place]

## 2010-09-03 NOTE — H&P (Signed)
NAMEVISHAL, [name]                ACCOUNT NO.:  [id number]   MEDICAL RECORD NO.:  [id number]          PATIENT TYPE:  INP   LOCATION:  1324                         FACILITY:  [facility]   PHYSICIAN:  Hillery Aldo, M.D.   DATE OF BIRTH:  1955-05-12   DATE OF ADMISSION:  12/26/2004  DATE OF DISCHARGE:                                HISTORY & PHYSICAL   CHIEF COMPLAINT:  Boils for one week.   HISTORY OF PRESENT ILLNESS:  The patient is a 55 year old male who was  admitted to the hospital for treatment of a community acquired methicillin  resistant staph aureus infection of his right thigh on October 31, 2004. He  subsequently followed up with Dr. Maurice March at the outpatient clinic some time  last week. He has a past medical history of HIV and a CD-4 count of  approximately 60. Dr. Maurice March was attempting to get him enrolled in the ADAP  program for consideration of financial support to obtain his medications.  The patient states that over the last week, he has developed painful boils  on his scrotum, groin folds, and intertriginous areas of the buttocks. He  also reports fever and night sweats for approximately 2 weeks. He was  admitted for further evaluation and treatment of his disseminated soft  tissue methicillin resistant staph aureus infection.   PAST MEDICAL HISTORY:  Human immunodeficiency virus/AIDS diagnosed in 2000.  The patient reports having been on anti-retroviral therapy in the past but  currently is not on any medications secondary to losing his health  insurance.   PAST SURGICAL HISTORY:  None.   FAMILY HISTORY:  The patient's mother died at age 22 secondary to  complications of coronary artery disease, hypertension, and diabetes. He  does not know any information about his father's past medical history. He  has many siblings, several of whom suffer with diabetes and complications of  diabetes. He has 1 sister who is deceased secondary to complications of  polysubstance  abuse.   SOCIAL HISTORY:  The patient lives in [place] by himself. He is currently  disabled. 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 He has  remote tobacco use, none any time recently. He does smoke marijuana  occasionally. Denies any alcohol use. He does report a history of heavy  alcohol use in the past.   ALLERGIES:  No known drug allergies.   MEDICATIONS:  None now.   REVIEW OF SYSTEMS:  Again, the patient reports fever for approximately [redacted]  weeks along with night sweats. Reports that his appetite is good although he  has lost some weight over the past several months. Denies any chest pain or  shortness of breath. He has had an occasional dry cough. No changes in his  bowel habits, melena, or hematochezia. No dysuria or hematuria.  Some mild left ankle tenderness. No history of trauma to that ankle. Skin as  described above.   PHYSICAL EXAMINATION:  VITAL SIGNS:  Temperature 97.4, pulse 122,  respiratory rate 24, blood pressure 163/131. O2 saturation 100% on room air.  GENERAL:  A  well developed, well nourished male in no acute distress. Non-  toxic appearing.  HEENT:  Normocephalic and atraumatic. Pupils are equal, round, and reactive  to light. Extraocular muscles intact. Sclerae non-icteric. Oropharynx  reveals thrush to the upper palpate and poor dentition.  NECK:  Supple. No thyromegaly. No lymphadenopathy. No jugular venous  distention.  CHEST:  Lungs clear to auscultation bilaterally with good air movement.  HEART:  Regular rhythm, tachycardiac rate. No murmur, rub, or gallop.  ABDOMEN:  Soft, nontender, and nondistended. Normal active bowel sounds.  GENITOURINARY:  The patient has large boils to his scrotum, one of which has  spontaneously opened and drained, revealing necrotic tissue. There are also  many smaller boils in the groin folds. He has a large draining boil in the  intertriginous folds of his buttocks. This too is necrotic at the base  with  yellow appearing tissue.  EXTREMITIES:  No clubbing, cyanosis, or edema.  SKIN:  As noted under GU. Otherwise, no rashes.  NEUROLOGIC:  Alert and oriented times three. Cranial nerves 2-12 are grossly  intact. He moves all extremities x4 with equal strength.   LABORATORY DATA:  CBC is pending. Chemistry showed a sodium of 136,  potassium 3.5, chloride 104, bicarbonate 24, BUN 13, creatinine 1.2, glucose  104.   ASSESSMENT/PLAN:  1.  Community acquired methicillin resistant staph aureus disseminated soft      tissue infection:  We will admit the patient and administer intravenous      vancomycin. Additionally, he may be a carrier, so we will culture his      nares and if he is found to be a carrier, we will institute Bactroban      ointment to his nares bilaterally times several weeks. I have instructed      the patient to Dial with an antimicrobial soap, either Dial or Lever      2000. He may need further incision and drainage of his abscesses but the      larger ones have spontaneously erupted and are draining. Will continue      to monitor and obtain a surgical consultation if needed.   1.  HIV/AIDS:  The patient likely meets the criteria for AIDS at this point.      He thinks his last CD-4 count was in the 60's. Given this, we will      empirically prophylax him for PCP and MAC infections with Trimethoprim      and azithromycin. I have contacted Dr. Maurice March and let him know of the      patient's admission and he will see him sometime this weekend. He is      currently attempting to get funding for his anti-HIV medications through      the ADAP program. We will obtain a social work consult to see what the      progress is regarding this issue.   1.  Thrush:  Will initiate Diflucan at 200 mg today and then 100 mg daily      thereafter for a course of at least 2 weeks, to prevent recurrence.  1.  Hypertension:  Will continue to monitor and if he remains hypertensive,      will  institute anti-hypertensive medications. He does have a family      history of hypertension, so he will likely require medication for this.   1.  Prophylaxis:  Will initiate GI and DVT prophylaxis.  ______________________________  Hillery Aldo, M.D.     CR/MEDQ  D:  12/26/2004  T:  12/26/2004  Job:  [id number]   cc:   Fransisco Hertz, M.D.  [address]  [place] [address]  Fax: [phone]

## 2011-01-11 LAB — T-HELPER CELL (CD4) - (RCID CLINIC ONLY): CD4 % Helper T Cell: 29 — ABNORMAL LOW

## 2011-01-21 LAB — T-HELPER CELL (CD4) - (RCID CLINIC ONLY): CD4 T Cell Abs: 650

## 2011-01-31 LAB — T-HELPER CELL (CD4) - (RCID CLINIC ONLY)
CD4 % Helper T Cell: 22 — ABNORMAL LOW
CD4 T Cell Abs: 580

## 2011-07-01 ENCOUNTER — Other Ambulatory Visit (INDEPENDENT_AMBULATORY_CARE_PROVIDER_SITE_OTHER): Payer: Self-pay

## 2011-07-01 DIAGNOSIS — Z79899 Other long term (current) drug therapy: Secondary | ICD-10-CM

## 2011-07-01 DIAGNOSIS — B20 Human immunodeficiency virus [HIV] disease: Secondary | ICD-10-CM

## 2011-07-01 DIAGNOSIS — Z113 Encounter for screening for infections with a predominantly sexual mode of transmission: Secondary | ICD-10-CM

## 2011-07-01 LAB — COMPLETE METABOLIC PANEL WITH GFR
ALT: 15 U/L (ref 0–53)
Albumin: 4.3 g/dL (ref 3.5–5.2)
CO2: 23 mEq/L (ref 19–32)
Calcium: 9.6 mg/dL (ref 8.4–10.5)
Chloride: 105 mEq/L (ref 96–112)
GFR, Est African American: 75 mL/min
GFR, Est Non African American: 65 mL/min
Glucose, Bld: 120 mg/dL — ABNORMAL HIGH (ref 70–99)
Potassium: 4.1 mEq/L (ref 3.5–5.3)
Total Protein: 7.6 g/dL (ref 6.0–8.3)

## 2011-07-01 LAB — LIPID PANEL
Cholesterol: 211 mg/dL — ABNORMAL HIGH (ref 0–200)
HDL: 41 mg/dL (ref >39)
LDL Cholesterol: 143 mg/dL — ABNORMAL HIGH (ref 0–99)
Total CHOL/HDL Ratio: 5.1 Ratio
Triglycerides: 135 mg/dL (ref <150)
VLDL: 27 mg/dL (ref 0–40)

## 2011-07-01 LAB — CBC WITH DIFFERENTIAL/PLATELET
Basophils Absolute: 0 10*3/uL (ref 0.0–0.1)
Basophils Relative: 1 % (ref 0–1)
HCT: 38 % — ABNORMAL LOW (ref 39.0–52.0)
MCHC: 33.4 g/dL (ref 30.0–36.0)
Monocytes Absolute: 1 10*3/uL (ref 0.1–1.0)
Neutro Abs: 1.8 10*3/uL (ref 1.7–7.7)
RDW: 13 % (ref 11.5–15.5)

## 2011-07-05 LAB — HIV-1 RNA QUANT-NO REFLEX-BLD
HIV 1 RNA Quant: <20 {copies}/mL
HIV-1 RNA Quant, Log: <1.3 {Log}

## 2011-07-19 ENCOUNTER — Encounter: Payer: Self-pay | Admitting: Internal Medicine

## 2011-07-19 ENCOUNTER — Other Ambulatory Visit: Payer: Self-pay | Admitting: *Deleted

## 2011-07-19 ENCOUNTER — Ambulatory Visit (INDEPENDENT_AMBULATORY_CARE_PROVIDER_SITE_OTHER): Payer: Self-pay | Admitting: Internal Medicine

## 2011-07-19 ENCOUNTER — Ambulatory Visit: Payer: Self-pay

## 2011-07-19 ENCOUNTER — Telehealth: Payer: Self-pay | Admitting: *Deleted

## 2011-07-19 VITALS — BP 194/95 | HR 76 | Temp 98.0°F | Ht 70.0 in | Wt 166.0 lb

## 2011-07-19 DIAGNOSIS — B20 Human immunodeficiency virus [HIV] disease: Secondary | ICD-10-CM

## 2011-07-19 DIAGNOSIS — Z23 Encounter for immunization: Secondary | ICD-10-CM

## 2011-07-19 DIAGNOSIS — Z21 Asymptomatic human immunodeficiency virus [HIV] infection status: Secondary | ICD-10-CM

## 2011-07-19 MED ORDER — EFAVIRENZ-EMTRICITAB-TENOFOVIR 600-200-300 MG PO TABS: 1.0000 | ORAL_TABLET | Freq: Every day | ORAL | Status: DC

## 2011-07-19 MED ORDER — LISINOPRIL-HYDROCHLOROTHIAZIDE 20-25 MG PO TABS: 1.0000 | ORAL_TABLET | Freq: Every day | ORAL | Status: DC

## 2011-07-19 NOTE — Telephone Encounter (Signed)
Referral made to dental clinic Wendall Mola CMA

## 2011-07-19 NOTE — Progress Notes (Signed)
HIV CLINIC NOTE  RFV: re-engagement in care, last seen April 2011  Subjective:    Patient ID: [name], male    DOB: 1955-06-13, 56 y.o.   MRN: [id number]  HPI Mr. Baggerly is a 56yo Male with HIV, CD4 count of 550, VL < 20 on (07/01/11), on Atripla. Last seen in clinic in April 2011. He has been getting his medications through the mail or his friend, but has not missed doses. Since last time he was in clinic, he denies having any hospitalizations or severe illnesses. 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    Prior to Admission medications   Medication Sig Start Date End Date Taking? Authorizing Provider  efavirenz-emtrictabine-tenofovir (ATRIPLA) 600-200-300 MG per tablet Take 1 tablet by mouth at bedtime.   Yes Historical Provider, MD   Active Ambulatory Problems    Diagnosis Date Noted  . METHICILLIN RESISTANT STAPHYLOCOCCUS AUREUS INFECTION 10/31/2004  . HIV DISEASE 05/30/2006  . HYPERLIPIDEMIA 05/02/2007  . HYPERTENSION 05/30/2006  . DENTAL CARIES 05/30/2006  . WEIGHT LOSS 08/30/2006   Resolved Ambulatory Problems    Diagnosis Date Noted  . No Resolved Ambulatory Problems   No Additional Past Medical History   No Known Allergies History  Substance Use Topics  . Smoking status: Never Smoker   . Smokeless tobacco: Never Used  . Alcohol Use: No  family history is not on file.    Review of Systems  Constitutional: Negative for fever, chills, diaphoresis, activity change, appetite change, fatigue and unexpected weight change.  HENT: Negative for congestion, sore throat, rhinorrhea, sneezing, trouble swallowing and sinus pressure.  Eyes: Negative for photophobia and visual disturbance.  Respiratory: Negative for cough, chest tightness, shortness of breath, wheezing and stridor.  Cardiovascular: Negative for chest pain, palpitations and leg swelling.  Gastrointestinal: Negative for nausea, vomiting, abdominal pain, diarrhea, constipation,  blood in stool, abdominal distention and anal bleeding.  Genitourinary: Negative for dysuria, hematuria, flank pain and difficulty urinating.  Musculoskeletal: Negative for myalgias, back pain, joint swelling, arthralgias and gait problem.  Skin: Negative for color change, pallor, rash and wound.  Neurological: Negative for dizziness, tremors, weakness and light-headedness.  Hematological: Negative for adenopathy. Does not bruise/bleed easily.  Psychiatric/Behavioral: Negative for behavioral problems, confusion, sleep disturbance, dysphoric mood, decreased concentration and agitation.       Objective:   Physical Exam BP 194/95  Pulse 76  Temp(Src) 98 F (36.7 C) (Oral)  Ht 5\' 10"  (1.778 m)  Wt 166 lb (75.297 kg)  BMI 23.82 kg/m2  General Appearance:    Alert, cooperative, no distress, appears stated age  Head:    Normocephalic, without obvious abnormality, atraumatic  Eyes:    PERRL, conjunctiva/corneas clear, EOM's intact, fundi    benign, both eyes       Ears:    Normal TM's and external ear canals, both ears  Nose:   Nares normal, septum midline, mucosa normal, no drainage   or sinus tenderness  Throat:   Lips, mucosa, and tongue normal; teeth and gums normal  Neck:   Supple, symmetrical, trachea midline, no adenopathy;       thyroid:  No enlargement/tenderness/nodules; no carotid   bruit or JVD  Back:     Symmetric, no curvature, ROM normal, no CVA tenderness  Lungs:     Clear to auscultation bilaterally, respirations unlabored  Chest wall:    No tenderness or deformity  Heart:    Regular rate and rhythm, S1 and S2  normal, no murmur, rub   or gallop  Abdomen:     Soft, non-tender, bowel sounds active all four quadrants,    no masses, no organomegaly  Genitalia:    Normal male without lesion, discharge or tenderness  Rectal:    Normal tone, normal prostate, no masses or tenderness;   guaiac negative stool  Extremities:   Extremities normal, atraumatic, no cyanosis or edema   Pulses:   2+ and symmetric all extremities  Skin:   Skin color, texture, turgor normal, no rashes or lesions  Lymph nodes:   Cervical, supraclavicular, and axillary nodes normal  Neurologic:   CNII-XII intact. Normal strength, sensation and reflexes      throughout         Assessment & Plan:  HIV = will continue on Atripla; will need to do ADAP paperwork. Currently he is getting his meds from a friend. Will need to get his own rx.  Immunizations = will give pneumococcal   Dental referrals = will make dental referral  Health promotion = will place PPD  HTN= poorly controlled, will re-start lisinopril/HCTZ, but if not controlled, will add other agents.   rtc in 1 month to monitor BP   rtc in 1 month

## 2011-07-20 ENCOUNTER — Ambulatory Visit: Payer: Self-pay

## 2011-07-21 LAB — TB SKIN TEST: TB Skin Test: NEGATIVE mm

## 2011-07-29 ENCOUNTER — Ambulatory Visit: Payer: Self-pay

## 2011-08-18 ENCOUNTER — Ambulatory Visit: Payer: Self-pay | Admitting: Internal Medicine

## 2011-08-30 ENCOUNTER — Ambulatory Visit (INDEPENDENT_AMBULATORY_CARE_PROVIDER_SITE_OTHER): Payer: Self-pay | Admitting: Internal Medicine

## 2011-08-30 ENCOUNTER — Encounter: Payer: Self-pay | Admitting: Internal Medicine

## 2011-08-30 ENCOUNTER — Ambulatory Visit: Payer: Self-pay

## 2011-08-30 VITALS — BP 157/99 | HR 66 | Temp 98.4°F | Ht 70.0 in | Wt 161.1 lb

## 2011-08-30 DIAGNOSIS — I10 Essential (primary) hypertension: Secondary | ICD-10-CM

## 2011-08-30 MED ORDER — AMLODIPINE BESYLATE 10 MG PO TABS: 10.0000 mg | ORAL_TABLET | Freq: Every day | ORAL | Status: DC

## 2011-08-30 NOTE — Progress Notes (Signed)
HIV CLINIC FOLLOW UP NOTE  RFV: routine follow up Subjective:    Patient ID: [name], male    DOB: 01/27/1956, 56 y.o.   MRN: [id number]  HPI [name] is a pleasant 56yo male with HIV, CD 4 count of 550, undetectable VL on Atripla. Last seen 1 month ago, re-establishing care. In the past 30 days, he does report running out of medication for 4 days, before resuming and getting adap. Otherwise, in good state of health. No f/c/ns/n/v/diarrhea/rash/dysuria. He reports not taking his blood pressure medicines this morning. 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  Prior to Admission medications   Medication Sig Start Date End Date Taking? Authorizing Provider  efavirenz-emtrictabine-tenofovir (ATRIPLA) 600-200-300 MG per tablet Take 1 tablet by mouth at bedtime. 07/19/11  Yes Andre Munson, MD  lisinopril-hydrochlorothiazide (PRINZIDE,ZESTORETIC) 20-25 MG per tablet Take 1 tablet by mouth daily. 07/19/11 07/18/12 Yes Andre Munson, MD       Active Ambulatory Problems    Diagnosis Date Noted  . METHICILLIN RESISTANT STAPHYLOCOCCUS AUREUS INFECTION 10/31/2004  . HIV DISEASE 05/30/2006  . HYPERLIPIDEMIA 05/02/2007  . HYPERTENSION 05/30/2006  . DENTAL CARIES 05/30/2006  . WEIGHT LOSS 08/30/2006   Resolved Ambulatory Problems    Diagnosis Date Noted  . No Resolved Ambulatory Problems   No Additional Past Medical History     Review of Systems As per HPi, 10 point review of systems are negative    Objective:   Physical Exam BP 157/99  Pulse 66  Temp(Src) 98.4 F (36.9 C) (Oral)  Ht 5\' 10"  (1.778 m)  Wt 161 lb 1.9 oz (73.084 kg)  BMI 23.12 kg/m2 Physical Exam  Constitutional: He is oriented to person, place, and time. He appears well-developed and well-nourished. No distress.  HENT: [name]/AT, perrla, eomi, no scleral icterus; TM obscured by cerumen bilaterally Mouth/Throat: Oropharynx is clear and moist. No oropharyngeal exudate, but poor  dentition Cardiovascular: Normal rate, regular rhythm and normal heart sounds. Exam reveals no gallop and no friction rub.  No murmur heard.  Pulmonary/Chest: Effort normal and breath sounds normal. No respiratory distress. He has no wheezes.  Abdominal: Soft. Bowel sounds are normal. He exhibits no distension. There is no tenderness.  Lymphadenopathy:  He has no cervical adenopathy.  Neurological: He is alert and oriented to person, place, and time.  Skin: Skin is warm and dry. No rash noted. No erythema.  Psychiatric: He has a normal mood and affect. His behavior is normal.        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 Will continue with current therapy for now and re-check his VL in 2 months, if detectable. Will discuss need to change therapy.   Adherence= reminded him that he needs to remember not to miss doses or call pharmacy ahead of time if coming towards end of his rx  Hypertension= may need amlodipine. Will check BP at next visit.  Dental caries= will need to see where he is at on waiting list for dental work  rtc in 2 months

## 2011-09-01 ENCOUNTER — Other Ambulatory Visit: Payer: Self-pay | Admitting: *Deleted

## 2011-09-01 DIAGNOSIS — B20 Human immunodeficiency virus [HIV] disease: Secondary | ICD-10-CM

## 2011-09-01 MED ORDER — EFAVIRENZ-EMTRICITAB-TENOFOVIR 600-200-300 MG PO TABS: 1.0000 | ORAL_TABLET | Freq: Every day | ORAL | Status: DC

## 2011-09-28 ENCOUNTER — Other Ambulatory Visit: Payer: Self-pay | Admitting: Licensed Clinical Social Worker

## 2011-09-28 DIAGNOSIS — B20 Human immunodeficiency virus [HIV] disease: Secondary | ICD-10-CM

## 2011-09-28 MED ORDER — EFAVIRENZ-EMTRICITAB-TENOFOVIR 600-200-300 MG PO TABS: 1.0000 | ORAL_TABLET | Freq: Every day | ORAL | Status: DC

## 2011-10-28 ENCOUNTER — Telehealth: Payer: Self-pay | Admitting: *Deleted

## 2011-10-28 NOTE — Telephone Encounter (Signed)
Patient called to see where he is on the dental list. Advised the patient that I would check and call him back. Found out that the patient is #37 on the list, called him and advised him of this. Also advised him that depending on the ability to get in touch with patients he may be called for next month and that I gave them his number [phone] and he needs to answer when they call.

## 2011-11-10 ENCOUNTER — Other Ambulatory Visit: Payer: Self-pay | Admitting: Internal Medicine

## 2011-11-10 DIAGNOSIS — B20 Human immunodeficiency virus [HIV] disease: Secondary | ICD-10-CM

## 2011-11-16 ENCOUNTER — Other Ambulatory Visit: Payer: Self-pay

## 2011-11-30 ENCOUNTER — Telehealth: Payer: Self-pay | Admitting: *Deleted

## 2011-11-30 ENCOUNTER — Ambulatory Visit: Payer: Self-pay | Admitting: Internal Medicine

## 2011-11-30 NOTE — Telephone Encounter (Signed)
Called patient to try and rescheule his missed appt the phone just rang. No voicemail to leave a message.

## 2011-12-13 ENCOUNTER — Other Ambulatory Visit: Payer: Self-pay

## 2011-12-27 ENCOUNTER — Ambulatory Visit: Payer: Self-pay | Admitting: Internal Medicine

## 2011-12-27 ENCOUNTER — Other Ambulatory Visit (INDEPENDENT_AMBULATORY_CARE_PROVIDER_SITE_OTHER): Payer: Self-pay

## 2011-12-27 DIAGNOSIS — B20 Human immunodeficiency virus [HIV] disease: Secondary | ICD-10-CM

## 2011-12-27 LAB — CBC WITH DIFFERENTIAL/PLATELET
Eosinophils Absolute: 0.2 10*3/uL (ref 0.0–0.7)
HCT: 36.2 % — ABNORMAL LOW (ref 39.0–52.0)
Hemoglobin: 12.5 g/dL — ABNORMAL LOW (ref 13.0–17.0)
Lymphs Abs: 2.2 10*3/uL (ref 0.7–4.0)
MCH: 31.3 pg (ref 26.0–34.0)
MCV: 90.7 fL (ref 78.0–100.0)
Monocytes Absolute: 0.7 10*3/uL (ref 0.1–1.0)
Monocytes Relative: 12 % (ref 3–12)
Neutrophils Relative %: 44 % (ref 43–77)
RBC: 3.99 MIL/uL — ABNORMAL LOW (ref 4.22–5.81)

## 2011-12-27 LAB — COMPREHENSIVE METABOLIC PANEL
Albumin: 4.1 g/dL (ref 3.5–5.2)
BUN: 15 mg/dL (ref 6–23)
CO2: 27 mEq/L (ref 19–32)
Glucose, Bld: 83 mg/dL (ref 70–99)
Sodium: 140 mEq/L (ref 135–145)
Total Bilirubin: 0.3 mg/dL (ref 0.3–1.2)
Total Protein: 7.4 g/dL (ref 6.0–8.3)

## 2011-12-27 NOTE — Addendum Note (Signed)
Addended by: Emilio Aspen on: 12/27/2011 12:12 PM   Modules accepted: Orders

## 2011-12-28 LAB — HIV-1 RNA QUANT-NO REFLEX-BLD
HIV 1 RNA Quant: <20 copies/mL (ref <20)
HIV-1 RNA Quant, Log: <1.3 {Log} (ref <1.30)

## 2012-02-07 ENCOUNTER — Ambulatory Visit: Payer: Self-pay

## 2012-02-09 ENCOUNTER — Ambulatory Visit (INDEPENDENT_AMBULATORY_CARE_PROVIDER_SITE_OTHER): Payer: Self-pay | Admitting: Internal Medicine

## 2012-02-09 ENCOUNTER — Ambulatory Visit: Payer: Self-pay

## 2012-02-09 ENCOUNTER — Encounter: Payer: Self-pay | Admitting: Internal Medicine

## 2012-02-09 VITALS — BP 191/96 | HR 69 | Temp 97.7°F | Wt 164.0 lb

## 2012-02-09 DIAGNOSIS — Z23 Encounter for immunization: Secondary | ICD-10-CM

## 2012-02-09 DIAGNOSIS — I10 Essential (primary) hypertension: Secondary | ICD-10-CM

## 2012-02-09 MED ORDER — AMLODIPINE BESYLATE 10 MG PO TABS: 10.0000 mg | ORAL_TABLET | Freq: Every day | ORAL | Status: DC

## 2012-02-09 NOTE — Progress Notes (Signed)
HIV CLINIC NOTE  RFV: routine visit Subjective:    Patient ID: [name], male    DOB: 1956/02/13, 56 y.o.   MRN: [id number]  HPI hiv, CD 4 count 760/ VL <20 on atripla. Has not missed any doses. Doing well. 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 He is spending more time with ex wife. No complaints. No recent illness since last appt.  Current Outpatient Prescriptions on File Prior to Visit  Medication Sig Dispense Refill  . efavirenz-emtrictabine-tenofovir (ATRIPLA) 600-200-300 MG per tablet Take 1 tablet by mouth at bedtime.  30 tablet  5  . lisinopril-hydrochlorothiazide (PRINZIDE,ZESTORETIC) 20-25 MG per tablet Take 1 tablet by mouth daily.  90 tablet  3   Active Ambulatory Problems    Diagnosis Date Noted  . METHICILLIN RESISTANT STAPHYLOCOCCUS AUREUS INFECTION 10/31/2004  . HIV DISEASE 05/30/2006  . HYPERLIPIDEMIA 05/02/2007  . HYPERTENSION 05/30/2006  . DENTAL CARIES 05/30/2006  . WEIGHT LOSS 08/30/2006   Resolved Ambulatory Problems    Diagnosis Date Noted  . No Resolved Ambulatory Problems   No Additional Past Medical History     Review of Systems Occ. Headaches but none currently. No fever, chills, nightsweats; N/V/D    Objective:   Physical Exam BP 191/96  Pulse 69  Temp 97.7 F (36.5 C) (Oral)  Wt 164 lb (74.39 kg) Physical Exam  Constitutional: He is oriented to person, place, and time. He appears well-developed and well-nourished. No distress.  HENT:  Mouth/Throat: Oropharynx is clear and moist. No oropharyngeal exudate.  Cardiovascular: Normal rate, regular rhythm and normal heart sounds. Exam reveals no gallop and no friction rub.  No murmur heard.  Pulmonary/Chest: Effort normal and breath sounds normal. No respiratory distress. He has no wheezes.  Abdominal: Soft. Bowel sounds are normal. He exhibits no distension. There is no tenderness.  Lymphadenopathy:  He has no cervical adenopathy.  Neurological: He is alert and oriented to person,  place, and time.  Skin: Skin is warm and dry. No rash noted. No erythema.  Psychiatric: He has a normal mood and affect. His behavior is normal.       Assessment & Plan:  HIV = excellent adherence. Continue with atripla qhs  Health maintenance = received flu shot today  Hypertension = poorly controlled, will start amlodpine in addition to his lisinopril/hctz. Will having him start with 5mg  x 3 days then increase to 10mg  daily. rtc in 1 wk to check BP, to see if need to increase lisinopril from 20 to 40mg  daily.  rtc in 3 months for appt for hiv

## 2012-02-16 ENCOUNTER — Telehealth: Payer: Self-pay | Admitting: *Deleted

## 2012-02-16 NOTE — Telephone Encounter (Signed)
ADAP approval has not started.  Pt has not started b/p medication.  Pt will call for b/p f/u appt a week after starting medication.  Pt verbalized understanding.

## 2012-04-27 ENCOUNTER — Other Ambulatory Visit: Payer: Self-pay | Admitting: *Deleted

## 2012-04-27 DIAGNOSIS — B20 Human immunodeficiency virus [HIV] disease: Secondary | ICD-10-CM

## 2012-05-08 ENCOUNTER — Other Ambulatory Visit: Payer: Self-pay

## 2012-05-22 ENCOUNTER — Ambulatory Visit (INDEPENDENT_AMBULATORY_CARE_PROVIDER_SITE_OTHER): Payer: Self-pay | Admitting: Internal Medicine

## 2012-05-22 DIAGNOSIS — B20 Human immunodeficiency virus [HIV] disease: Secondary | ICD-10-CM

## 2012-05-22 NOTE — Progress Notes (Signed)
  Subjective:    Patient ID: [name], male    DOB: 1955/05/22, 57 y.o.   MRN: [id number]  HPI   Review of Systems     Objective:   Physical Exam        Assessment & Plan:  No show

## 2012-06-13 ENCOUNTER — Ambulatory Visit: Payer: Self-pay

## 2012-07-19 ENCOUNTER — Encounter: Payer: Self-pay | Admitting: *Deleted

## 2012-09-21 ENCOUNTER — Encounter: Payer: Self-pay | Admitting: Sports Medicine

## 2012-09-24 ENCOUNTER — Other Ambulatory Visit: Payer: Self-pay | Admitting: Licensed Clinical Social Worker

## 2012-09-24 DIAGNOSIS — B20 Human immunodeficiency virus [HIV] disease: Secondary | ICD-10-CM

## 2012-09-24 MED ORDER — EFAVIRENZ-EMTRICITAB-TENOFOVIR 600-200-300 MG PO TABS: 1.0000 | ORAL_TABLET | Freq: Every day | ORAL | Status: DC

## 2012-10-23 ENCOUNTER — Telehealth: Payer: Self-pay | Admitting: Licensed Clinical Social Worker

## 2012-10-23 NOTE — Telephone Encounter (Signed)
Patient returned call about his no show, and I explained to him that he would need to come to the walk in clinic before 11 daily and before 4 pm. I also notified his pharmacy that they would need to hold his refills which was a 6 months supply until his appointment.

## 2012-11-21 ENCOUNTER — Other Ambulatory Visit (INDEPENDENT_AMBULATORY_CARE_PROVIDER_SITE_OTHER): Payer: Self-pay

## 2012-11-21 DIAGNOSIS — B20 Human immunodeficiency virus [HIV] disease: Secondary | ICD-10-CM

## 2012-11-21 DIAGNOSIS — Z79899 Other long term (current) drug therapy: Secondary | ICD-10-CM

## 2012-11-21 DIAGNOSIS — Z113 Encounter for screening for infections with a predominantly sexual mode of transmission: Secondary | ICD-10-CM

## 2012-11-21 LAB — CBC WITH DIFFERENTIAL/PLATELET
Basophils Relative: 1 % (ref 0–1)
Eosinophils Absolute: 0.2 10*3/uL (ref 0.0–0.7)
HCT: 34.8 % — ABNORMAL LOW (ref 39.0–52.0)
Hemoglobin: 11.7 g/dL — ABNORMAL LOW (ref 13.0–17.0)
MCH: 30.7 pg (ref 26.0–34.0)
MCHC: 33.6 g/dL (ref 30.0–36.0)
Monocytes Absolute: 0.6 10*3/uL (ref 0.1–1.0)
Monocytes Relative: 10 % (ref 3–12)
Neutro Abs: 2.4 10*3/uL (ref 1.7–7.7)

## 2012-11-21 LAB — COMPLETE METABOLIC PANEL WITH GFR
Albumin: 3.8 g/dL (ref 3.5–5.2)
Alkaline Phosphatase: 84 U/L (ref 39–117)
BUN: 13 mg/dL (ref 6–23)
GFR, Est African American: 85 mL/min
GFR, Est Non African American: 74 mL/min
Glucose, Bld: 88 mg/dL (ref 70–99)
Potassium: 4.1 mEq/L (ref 3.5–5.3)

## 2012-11-21 LAB — LIPID PANEL: HDL: 40 mg/dL (ref >39)

## 2012-11-21 LAB — RPR

## 2012-11-22 LAB — T-HELPER CELL (CD4) - (RCID CLINIC ONLY)
CD4 % Helper T Cell: 31 % — ABNORMAL LOW (ref 33–55)
CD4 T Cell Abs: 630 uL (ref 400–2700)

## 2012-12-04 ENCOUNTER — Ambulatory Visit: Payer: Self-pay

## 2012-12-04 ENCOUNTER — Ambulatory Visit (INDEPENDENT_AMBULATORY_CARE_PROVIDER_SITE_OTHER): Payer: Self-pay | Admitting: Internal Medicine

## 2012-12-04 ENCOUNTER — Encounter: Payer: Self-pay | Admitting: Internal Medicine

## 2012-12-04 VITALS — BP 157/89 | HR 71 | Temp 97.7°F | Wt 171.0 lb

## 2012-12-04 DIAGNOSIS — B20 Human immunodeficiency virus [HIV] disease: Secondary | ICD-10-CM

## 2012-12-04 DIAGNOSIS — I10 Essential (primary) hypertension: Secondary | ICD-10-CM

## 2012-12-04 MED ORDER — HYDROCHLOROTHIAZIDE 25 MG PO TABS: 25.0000 mg | ORAL_TABLET | Freq: Every day | ORAL | Status: DC

## 2012-12-04 MED ORDER — MEGESTROL ACETATE 625 MG/5ML PO SUSP: 625.0000 mg | Freq: Every day | ORAL | Status: DC

## 2012-12-04 NOTE — Progress Notes (Signed)
[facility] NOTE  RFV: routine HIV care, last seen in Oct 2013 Subjective:    Patient ID: [name], male    DOB: 03/20/1956, 57 y.o.   MRN: [id number]  HPI Atlas, is a 57yo M with HIV, CD 4 count of 630/VL<20, on atripla. No vivid dreams. Occ. Takes it very late, 1-2/wk, and ends up taking it up at 4 am. Overall doing well. Denies any recent sickness. No fever, chills, nightsweats, weight loss, uri, headache, n/v/d  Current Outpatient Prescriptions on File Prior to Visit  Medication Sig Dispense Refill  . amLODipine (NORVASC) 10 MG tablet Take 1 tablet (10 mg total) by mouth daily.  30 tablet  11  . efavirenz-emtricitabine-tenofovir (ATRIPLA) 600-200-300 MG per tablet Take 1 tablet by mouth at bedtime.  30 tablet  5  . lisinopril-hydrochlorothiazide (PRINZIDE,ZESTORETIC) 20-25 MG per tablet Take 1 tablet by mouth daily.  90 tablet  3   No current facility-administered medications on file prior to visit.   Active Ambulatory Problems    Diagnosis Date Noted  . METHICILLIN RESISTANT STAPHYLOCOCCUS AUREUS INFECTION 10/31/2004  . HIV DISEASE 05/30/2006  . HYPERLIPIDEMIA 05/02/2007  . HYPERTENSION 05/30/2006  . DENTAL CARIES 05/30/2006  . WEIGHT LOSS 08/30/2006   Resolved Ambulatory Problems    Diagnosis Date Noted  . No Resolved Ambulatory Problems   No Additional Past Medical History   Soc hx: does a lot of work through piano/keyboard. And also participating in choirs.  Review of Systems  Constitutional: Negative for fever, chills, diaphoresis, activity change, appetite change, fatigue and unexpected weight change.  HENT: Negative for congestion, sore throat, rhinorrhea, sneezing, trouble swallowing and sinus pressure.  Eyes: Negative for photophobia and visual disturbance.  Respiratory: Negative for cough, chest tightness, shortness of breath, wheezing and stridor.  Cardiovascular: Negative for chest pain, palpitations and leg swelling.  Gastrointestinal: Negative for  nausea, vomiting, abdominal pain, diarrhea, constipation, blood in stool, abdominal distention and anal bleeding.  Genitourinary: Negative for dysuria, hematuria, flank pain and difficulty urinating.  Musculoskeletal: Negative for myalgias, back pain, joint swelling, arthralgias and gait problem.  Skin: Negative for color change, pallor, rash and wound.  Neurological: Negative for dizziness, tremors, weakness and light-headedness.  Hematological: Negative for adenopathy. Does not bruise/bleed easily.  Psychiatric/Behavioral: Negative for behavioral problems, confusion, sleep disturbance, dysphoric mood, decreased concentration and agitation.       Objective:   Physical Exam BP 157/89  Pulse 71  Temp(Src) 97.7 F (36.5 C) (Oral)  Wt 171 lb (77.565 kg)  BMI 24.54 kg/m2 Physical Exam  Constitutional: He is oriented to person, place, and time. He appears well-developed and well-nourished. No distress.  HENT:  Mouth/Throat: Oropharynx is clear and moist. No oropharyngeal exudate.  Cardiovascular: Normal rate, regular rhythm and normal heart sounds. Exam reveals no gallop and no friction rub.  No murmur heard.  Pulmonary/Chest: Effort normal and breath sounds normal. No respiratory distress. He has no wheezes.  Abdominal: Soft. Bowel sounds are normal. He exhibits no distension. There is no tenderness.  Lymphadenopathy:  He has no cervical adenopathy.  Neurological: He is alert and oriented to person, place, and time.  Skin: Skin is warm and dry. No rash noted. No erythema.  Psychiatric: He has a normal mood and affect. His behavior is normal.        Assessment & Plan:  HIV =continue on atripla  HTN= still not at goal. Will start hctz 25 in addition to amlodipine 10mg  daily  Appetite stimulant = patient  reports having improved weight gain in the past with taking megace. we will do trial of megace  Health maintenance = will check ua, ur chlam, ur GC; will come back for flu vaccine  in 6 wks

## 2013-01-08 ENCOUNTER — Other Ambulatory Visit: Payer: Self-pay | Admitting: Internal Medicine

## 2013-01-15 ENCOUNTER — Ambulatory Visit (INDEPENDENT_AMBULATORY_CARE_PROVIDER_SITE_OTHER): Payer: Self-pay | Admitting: *Deleted

## 2013-01-15 VITALS — BP 131/81 | HR 75

## 2013-01-15 DIAGNOSIS — Z23 Encounter for immunization: Secondary | ICD-10-CM

## 2013-02-16 ENCOUNTER — Other Ambulatory Visit: Payer: Self-pay | Admitting: Internal Medicine

## 2013-03-06 ENCOUNTER — Encounter: Payer: Self-pay | Admitting: Internal Medicine

## 2013-03-06 ENCOUNTER — Ambulatory Visit (INDEPENDENT_AMBULATORY_CARE_PROVIDER_SITE_OTHER): Payer: Self-pay | Admitting: Internal Medicine

## 2013-03-06 VITALS — BP 150/95 | HR 90 | Temp 98.4°F | Ht 71.0 in | Wt 171.0 lb

## 2013-03-06 DIAGNOSIS — Z113 Encounter for screening for infections with a predominantly sexual mode of transmission: Secondary | ICD-10-CM

## 2013-03-06 DIAGNOSIS — N341 Nonspecific urethritis: Secondary | ICD-10-CM

## 2013-03-06 DIAGNOSIS — A5601 Chlamydial cystitis and urethritis: Secondary | ICD-10-CM

## 2013-03-06 DIAGNOSIS — B20 Human immunodeficiency virus [HIV] disease: Secondary | ICD-10-CM

## 2013-03-06 MED ORDER — AZITHROMYCIN 250 MG PO TABS
1000.0000 mg | ORAL_TABLET | Freq: Once | ORAL | Status: AC
  Administered 2013-03-06: 1000 mg via ORAL

## 2013-03-06 MED ORDER — CEFTRIAXONE SODIUM 1 G IJ SOLR
250.0000 mg | Freq: Once | INTRAMUSCULAR | Status: AC
  Administered 2013-03-06: 250 mg via INTRAMUSCULAR

## 2013-03-06 NOTE — Progress Notes (Signed)
  Subjective:    Patient ID: [name], male    DOB: September 25, 1955, 57 y.o.   MRN: [id number]  HPI He comes in for a work in visit. He tells me that he usually uses condoms so for recently a condom broke and since he has had white penile discharge with pyuria. No history of gonorrhea chlamydia in the past. No fever or chills. No lymphadenopathy. He continues to take his HIV medicines are   Review of Systems  Genitourinary: Positive for dysuria and discharge. Negative for frequency and genital sores.  Skin: Negative for rash.  Neurological: Negative for dizziness.  Hematological: Negative for adenopathy.       Objective:   Physical Exam  Constitutional: He is oriented to person, place, and time. He appears well-developed and well-nourished.  Eyes: No scleral icterus.  Lymphadenopathy:       Right: No inguinal adenopathy present.       Left: No inguinal adenopathy present.  Neurological: He is alert and oriented to person, place, and time.  Skin: No rash noted.  Psychiatric: He has a normal mood and affect. His behavior is normal.          Assessment & Plan:

## 2013-03-06 NOTE — Assessment & Plan Note (Signed)
He has followup already scheduled.

## 2013-03-06 NOTE — Assessment & Plan Note (Signed)
The symptoms are concerning for urethritis. I will treat him with Rocephin and azithromycin for gonorrhea and chlamydia. Will check his urine as well to confirm an RPR to assure this not syphilis.

## 2013-03-07 LAB — URINALYSIS, ROUTINE W REFLEX MICROSCOPIC
Bilirubin Urine: NEGATIVE
Glucose, UA: NEGATIVE mg/dL
Specific Gravity, Urine: >1.03 — ABNORMAL HIGH (ref 1.005–1.030)
Urobilinogen, UA: 0.2 mg/dL (ref 0.0–1.0)

## 2013-03-07 LAB — URINALYSIS, MICROSCOPIC ONLY: Casts: NONE SEEN

## 2013-03-07 LAB — RPR

## 2013-04-20 ENCOUNTER — Other Ambulatory Visit: Payer: Self-pay | Admitting: Internal Medicine

## 2013-04-25 ENCOUNTER — Other Ambulatory Visit: Payer: Self-pay | Admitting: *Deleted

## 2013-04-25 DIAGNOSIS — I10 Essential (primary) hypertension: Secondary | ICD-10-CM

## 2013-04-25 MED ORDER — AMLODIPINE BESYLATE 10 MG PO TABS: 10.0000 mg | ORAL_TABLET | Freq: Every day | ORAL | Status: DC

## 2013-05-23 ENCOUNTER — Other Ambulatory Visit: Payer: Self-pay | Admitting: Internal Medicine

## 2013-05-28 ENCOUNTER — Other Ambulatory Visit: Payer: Self-pay

## 2013-05-28 ENCOUNTER — Other Ambulatory Visit: Payer: Self-pay | Admitting: *Deleted

## 2013-05-28 DIAGNOSIS — B20 Human immunodeficiency virus [HIV] disease: Secondary | ICD-10-CM

## 2013-06-11 ENCOUNTER — Ambulatory Visit: Payer: Self-pay | Admitting: Internal Medicine

## 2013-07-18 ENCOUNTER — Encounter: Payer: Self-pay | Admitting: *Deleted

## 2013-07-18 ENCOUNTER — Other Ambulatory Visit (INDEPENDENT_AMBULATORY_CARE_PROVIDER_SITE_OTHER): Payer: Self-pay

## 2013-07-18 ENCOUNTER — Ambulatory Visit: Payer: Self-pay

## 2013-07-18 DIAGNOSIS — B20 Human immunodeficiency virus [HIV] disease: Secondary | ICD-10-CM

## 2013-07-18 LAB — CBC WITH DIFFERENTIAL/PLATELET
BASOS ABS: 0.1 10*3/uL (ref 0.0–0.1)
Basophils Relative: 1 % (ref 0–1)
Eosinophils Absolute: 0.1 10*3/uL (ref 0.0–0.7)
Eosinophils Relative: 1 % (ref 0–5)
HCT: 37 % — ABNORMAL LOW (ref 39.0–52.0)
Hemoglobin: 13 g/dL (ref 13.0–17.0)
LYMPHS PCT: 40 % (ref 12–46)
Lymphs Abs: 2.1 10*3/uL (ref 0.7–4.0)
MCH: 30.7 pg (ref 26.0–34.0)
MCHC: 35.1 g/dL (ref 30.0–36.0)
MCV: 87.5 fL (ref 78.0–100.0)
Monocytes Absolute: 0.6 10*3/uL (ref 0.1–1.0)
Monocytes Relative: 11 % (ref 3–12)
Neutro Abs: 2.5 10*3/uL (ref 1.7–7.7)
Neutrophils Relative %: 47 % (ref 43–77)
PLATELETS: 289 10*3/uL (ref 150–400)
RBC: 4.23 MIL/uL (ref 4.22–5.81)
RDW: 13.2 % (ref 11.5–15.5)
WBC: 5.3 10*3/uL (ref 4.0–10.5)

## 2013-07-18 LAB — COMPLETE METABOLIC PANEL WITH GFR
ALBUMIN: 3.9 g/dL (ref 3.5–5.2)
ALT: 42 U/L (ref 0–53)
AST: 31 U/L (ref 0–37)
Alkaline Phosphatase: 90 U/L (ref 39–117)
BUN: 16 mg/dL (ref 6–23)
CALCIUM: 9 mg/dL (ref 8.4–10.5)
CO2: 26 mEq/L (ref 19–32)
CREATININE: 1.16 mg/dL (ref 0.50–1.35)
Chloride: 99 mEq/L (ref 96–112)
GFR, Est African American: 81 mL/min
GFR, Est Non African American: 70 mL/min
GLUCOSE: 105 mg/dL — AB (ref 70–99)
POTASSIUM: 4 meq/L (ref 3.5–5.3)
Sodium: 137 mEq/L (ref 135–145)
Total Bilirubin: 0.4 mg/dL (ref 0.2–1.2)
Total Protein: 7.6 g/dL (ref 6.0–8.3)

## 2013-07-19 LAB — T-HELPER CELL (CD4) - (RCID CLINIC ONLY)
CD4 % Helper T Cell: 30 % — ABNORMAL LOW (ref 33–55)
CD4 T Cell Abs: 630 /uL (ref 400–2700)

## 2013-07-19 LAB — URINALYSIS
Bilirubin Urine: NEGATIVE
Glucose, UA: NEGATIVE mg/dL
Hgb urine dipstick: NEGATIVE
Ketones, ur: NEGATIVE mg/dL
LEUKOCYTES UA: NEGATIVE
Nitrite: NEGATIVE
PROTEIN: NEGATIVE mg/dL
Specific Gravity, Urine: 1.022 (ref 1.005–1.030)
UROBILINOGEN UA: 0.2 mg/dL (ref 0.0–1.0)
pH: 6.5 (ref 5.0–8.0)

## 2013-07-19 LAB — HIV-1 RNA QUANT-NO REFLEX-BLD
HIV 1 RNA Quant: <20 copies/mL (ref <20)
HIV-1 RNA Quant, Log: <1.3 {Log} (ref <1.30)

## 2013-07-23 ENCOUNTER — Other Ambulatory Visit: Payer: Self-pay | Admitting: *Deleted

## 2013-07-23 DIAGNOSIS — B20 Human immunodeficiency virus [HIV] disease: Secondary | ICD-10-CM

## 2013-07-23 MED ORDER — EFAVIRENZ-EMTRICITAB-TENOFOVIR 600-200-300 MG PO TABS: ORAL_TABLET | ORAL | Status: DC

## 2013-08-01 ENCOUNTER — Encounter: Payer: Self-pay | Admitting: Internal Medicine

## 2013-08-01 ENCOUNTER — Ambulatory Visit (INDEPENDENT_AMBULATORY_CARE_PROVIDER_SITE_OTHER): Payer: Self-pay | Admitting: Internal Medicine

## 2013-08-01 VITALS — BP 158/87 | HR 76 | Temp 97.5°F | Wt 165.0 lb

## 2013-08-01 DIAGNOSIS — B20 Human immunodeficiency virus [HIV] disease: Secondary | ICD-10-CM

## 2013-08-01 DIAGNOSIS — I10 Essential (primary) hypertension: Secondary | ICD-10-CM

## 2013-08-01 DIAGNOSIS — R634 Abnormal weight loss: Secondary | ICD-10-CM

## 2013-08-01 MED ORDER — LISINOPRIL 20 MG PO TABS: 20.0000 mg | ORAL_TABLET | Freq: Every day | ORAL | Status: DC

## 2013-08-01 MED ORDER — MEGESTROL ACETATE 625 MG/5ML PO SUSP: 625.0000 mg | Freq: Every day | ORAL | Status: DC

## 2013-08-01 NOTE — Progress Notes (Signed)
   Subjective:    Patient ID: [name], male    DOB: 1956-02-27, 58 y.o.   MRN: [id number]  HPI [name] is a 58yo M with HIV, CD 4 count of 630/VL<20 on atripla (april 2015) has good adherence on atripla. Last seen in nov 2014 treated for urethritis. Hep B immune. He reports that roughly 1 month ago he had flu like illness x 3 wk, bed bound at one time. Did not seek care, and resolved. He feels he had lost some weight since that time due to decrease appetite. His girlfriend, an Charity fundraiserN, was caring for him. He is doing well otherwise. Now back to his baseline  Current Outpatient Prescriptions on File Prior to Visit  Medication Sig Dispense Refill  . amLODipine (NORVASC) 10 MG tablet Take 1 tablet (10 mg total) by mouth daily.  30 tablet  11  . efavirenz-emtricitabine-tenofovir (ATRIPLA) 600-200-300 MG per tablet TAKE 1 TABLET BY MOUTH AT BEDTIME  30 tablet  11  . hydrochlorothiazide (HYDRODIURIL) 25 MG tablet Take 1 tablet (25 mg total) by mouth daily.  90 tablet  3  . megestrol (MEGACE ES) 625 MG/5ML suspension Take 5 mL (625 mg total) by mouth daily.  150 mL  0  . lisinopril-hydrochlorothiazide (PRINZIDE,ZESTORETIC) 20-25 MG per tablet Take 1 tablet by mouth daily.  90 tablet  3   No current facility-administered medications on file prior to visit.   Active Ambulatory Problems    Diagnosis Date Noted  . METHICILLIN RESISTANT STAPHYLOCOCCUS AUREUS INFECTION 10/31/2004  . HIV DISEASE 05/30/2006  . HYPERLIPIDEMIA 05/02/2007  . HYPERTENSION 05/30/2006  . DENTAL CARIES 05/30/2006  . WEIGHT LOSS 08/30/2006  . Urethritis, nongonococcal 03/06/2013   Resolved Ambulatory Problems    Diagnosis Date Noted  . No Resolved Ambulatory Problems   No Additional Past Medical History      Review of Systems 10 point ros is negative    Objective:   Physical Exam  BP 158/87  Pulse 76  Temp(Src) 97.5 F (36.4 C) (Oral)  Wt 165 lb (74.844 kg) Physical Exam  Constitutional: He is oriented to  person, place, and time. He appears well-developed and well-nourished. No distress.  HENT:  Mouth/Throat: Oropharynx is clear and moist. No oropharyngeal exudate.  Cardiovascular: Normal rate, regular rhythm and normal heart sounds. Exam reveals no gallop and no friction rub.  No murmur heard.  Pulmonary/Chest: Effort normal and breath sounds normal. No respiratory distress. He has no wheezes.  Abdominal: Soft. Bowel sounds are normal. He exhibits no distension. There is no tenderness.  Lymphadenopathy:  He has no cervical adenopathy.  Neurological: He is alert and oriented to person, place, and time.  Skin: Skin is warm and dry. No rash noted. No erythema.  Psychiatric: He has a normal mood and affect. His behavior is normal.         Assessment & Plan:  hiv = well controlled, continue on atripla. Does not have side effects from it  htn = still not at goal. He is currently on almodipine 10 and hctz 25. Will add lisinopril 20mg   Weight loss from flu = will refill megace  rtc in 3months

## 2013-10-31 ENCOUNTER — Other Ambulatory Visit (INDEPENDENT_AMBULATORY_CARE_PROVIDER_SITE_OTHER): Payer: Self-pay

## 2013-10-31 ENCOUNTER — Other Ambulatory Visit: Payer: Self-pay | Admitting: Internal Medicine

## 2013-10-31 DIAGNOSIS — Z113 Encounter for screening for infections with a predominantly sexual mode of transmission: Secondary | ICD-10-CM

## 2013-10-31 DIAGNOSIS — B20 Human immunodeficiency virus [HIV] disease: Secondary | ICD-10-CM

## 2013-10-31 DIAGNOSIS — Z79899 Other long term (current) drug therapy: Secondary | ICD-10-CM

## 2013-10-31 LAB — CBC WITH DIFFERENTIAL/PLATELET
BASOS ABS: 0 10*3/uL (ref 0.0–0.1)
BASOS PCT: 1 % (ref 0–1)
EOS PCT: 4 % (ref 0–5)
Eosinophils Absolute: 0.2 10*3/uL (ref 0.0–0.7)
HCT: 32.8 % — ABNORMAL LOW (ref 39.0–52.0)
HEMOGLOBIN: 11.3 g/dL — AB (ref 13.0–17.0)
Lymphocytes Relative: 38 % (ref 12–46)
Lymphs Abs: 1.8 10*3/uL (ref 0.7–4.0)
MCH: 31 pg (ref 26.0–34.0)
MCHC: 34.5 g/dL (ref 30.0–36.0)
MCV: 90.1 fL (ref 78.0–100.0)
MONOS PCT: 9 % (ref 3–12)
Monocytes Absolute: 0.4 10*3/uL (ref 0.1–1.0)
Neutro Abs: 2.3 10*3/uL (ref 1.7–7.7)
Neutrophils Relative %: 48 % (ref 43–77)
Platelets: 306 10*3/uL (ref 150–400)
RBC: 3.64 MIL/uL — ABNORMAL LOW (ref 4.22–5.81)
RDW: 14.3 % (ref 11.5–15.5)
WBC: 4.8 10*3/uL (ref 4.0–10.5)

## 2013-10-31 LAB — COMPREHENSIVE METABOLIC PANEL
ALT: 12 U/L (ref 0–53)
AST: 13 U/L (ref 0–37)
Albumin: 3.8 g/dL (ref 3.5–5.2)
Alkaline Phosphatase: 89 U/L (ref 39–117)
BILIRUBIN TOTAL: 0.3 mg/dL (ref 0.2–1.2)
BUN: 10 mg/dL (ref 6–23)
CO2: 25 mEq/L (ref 19–32)
Calcium: 9 mg/dL (ref 8.4–10.5)
Chloride: 107 mEq/L (ref 96–112)
Creat: 1.1 mg/dL (ref 0.50–1.35)
Glucose, Bld: 87 mg/dL (ref 70–99)
Potassium: 4.4 mEq/L (ref 3.5–5.3)
Sodium: 140 mEq/L (ref 135–145)
Total Protein: 6.9 g/dL (ref 6.0–8.3)

## 2013-10-31 LAB — LIPID PANEL
CHOL/HDL RATIO: 3.9 ratio
Cholesterol: 192 mg/dL (ref 0–200)
HDL: 49 mg/dL (ref >39)
LDL CALC: 128 mg/dL — AB (ref 0–99)
Triglycerides: 75 mg/dL (ref <150)
VLDL: 15 mg/dL (ref 0–40)

## 2013-10-31 LAB — RPR

## 2013-11-01 LAB — HIV-1 RNA QUANT-NO REFLEX-BLD: HIV 1 RNA Quant: <20 copies/mL (ref <20)

## 2013-11-01 LAB — T-HELPER CELL (CD4) - (RCID CLINIC ONLY)
CD4 % Helper T Cell: 35 % (ref 33–55)
CD4 T Cell Abs: 660 /uL (ref 400–2700)

## 2013-11-14 ENCOUNTER — Encounter: Payer: Self-pay | Admitting: Internal Medicine

## 2013-11-14 ENCOUNTER — Ambulatory Visit (INDEPENDENT_AMBULATORY_CARE_PROVIDER_SITE_OTHER): Payer: Self-pay | Admitting: Internal Medicine

## 2013-11-14 ENCOUNTER — Ambulatory Visit: Payer: Self-pay

## 2013-11-14 VITALS — BP 142/85 | HR 92 | Temp 98.2°F | Wt 164.0 lb

## 2013-11-14 DIAGNOSIS — B20 Human immunodeficiency virus [HIV] disease: Secondary | ICD-10-CM

## 2013-11-14 DIAGNOSIS — I10 Essential (primary) hypertension: Secondary | ICD-10-CM

## 2013-11-14 NOTE — Progress Notes (Signed)
Subjective:    Patient ID: [name], male    DOB: Jun 16, 1955, 58 y.o.   MRN: [id number]  HPI Ordean is a 58yo M with HIV, CD 4 count of 660/VL<20 on atirpla doing well. Denies any complaints or recent illness since last visit. Reports good adherence with atripla nad no significant SE.  Lab Results  Component Value Date   CD4TCELL 35 10/31/2013   CD4TABS 660 10/31/2013   Lab Results  Component Value Date   HIV1RNAQUANT <20 10/31/2013    Current Outpatient Prescriptions on File Prior to Visit  Medication Sig Dispense Refill  . amLODipine (NORVASC) 10 MG tablet Take 1 tablet (10 mg total) by mouth daily.  30 tablet  11  . efavirenz-emtricitabine-tenofovir (ATRIPLA) 600-200-300 MG per tablet TAKE 1 TABLET BY MOUTH AT BEDTIME  30 tablet  11  . hydrochlorothiazide (HYDRODIURIL) 25 MG tablet Take 1 tablet (25 mg total) by mouth daily.  90 tablet  3  . lisinopril (PRINIVIL,ZESTRIL) 20 MG tablet Take 1 tablet (20 mg total) by mouth daily.  30 tablet  11  . megestrol (MEGACE ES) 625 MG/5ML suspension Take 5 mLs (625 mg total) by mouth daily.  150 mL  3   No current facility-administered medications on file prior to visit.   Active Ambulatory Problems    Diagnosis Date Noted  . METHICILLIN RESISTANT STAPHYLOCOCCUS AUREUS INFECTION 10/31/2004  . HIV DISEASE 05/30/2006  . HYPERLIPIDEMIA 05/02/2007  . HYPERTENSION 05/30/2006  . DENTAL CARIES 05/30/2006  . WEIGHT LOSS 08/30/2006  . Urethritis, nongonococcal 03/06/2013   Resolved Ambulatory Problems    Diagnosis Date Noted  . No Resolved Ambulatory Problems   No Additional Past Medical History      Review of Systems Review of Systems  Constitutional: Negative for fever, chills, diaphoresis, activity change, appetite change, fatigue and unexpected weight change.  HENT: Negative for congestion, sore throat, rhinorrhea, sneezing, trouble swallowing and sinus pressure.  Eyes: Negative for photophobia and visual disturbance.    Respiratory: Negative for cough, chest tightness, shortness of breath, wheezing and stridor.  Cardiovascular: Negative for chest pain, palpitations and leg swelling.  Gastrointestinal: Negative for nausea, vomiting, abdominal pain, diarrhea, constipation, blood in stool, abdominal distention and anal bleeding.  Genitourinary: Negative for dysuria, hematuria, flank pain and difficulty urinating.  Musculoskeletal: Negative for myalgias, back pain, joint swelling, arthralgias and gait problem.  Skin: Negative for color change, pallor, rash and wound.  Neurological: Negative for dizziness, tremors, weakness and light-headedness.  Hematological: Negative for adenopathy. Does not bruise/bleed easily.  Psychiatric/Behavioral: Negative for behavioral problems, confusion, sleep disturbance, dysphoric mood, decreased concentration and agitation.       Objective:   Physical Exam BP 142/85  Pulse 92  Temp(Src) 98.2 F (36.8 C) (Oral)  Wt 164 lb (74.39 kg) Physical Exam  Constitutional: He is oriented to person, place, and time. He appears well-developed and well-nourished. No distress.  HENT:  Mouth/Throat: Oropharynx is clear and moist. No oropharyngeal exudate.  Cardiovascular: Normal rate, regular rhythm and normal heart sounds. Exam reveals no gallop and no friction rub.  No murmur heard.  Pulmonary/Chest: Effort normal and breath sounds normal. No respiratory distress. He has no wheezes.  Abdominal: Soft. Bowel sounds are normal. He exhibits no distension. There is no tenderness.  Lymphadenopathy:  He has no cervical adenopathy.  Neurological: He is alert and oriented to person, place, and time.  Skin: Skin is warm and dry. No rash noted. No erythema.  Psychiatric: He has a normal  mood and affect. His behavior is normal.       Assessment & Plan:  hiv = well controlled continue on atripla. No side effect  Health maintenance = flu vaccine in the fall  htn = still slightly elevated  but continue on current regimen, will change at next visit  Dental caries = he has contacted dental clinic  rtc in 3months

## 2013-12-25 ENCOUNTER — Other Ambulatory Visit: Payer: Self-pay | Admitting: *Deleted

## 2013-12-25 DIAGNOSIS — B20 Human immunodeficiency virus [HIV] disease: Secondary | ICD-10-CM

## 2013-12-25 MED ORDER — EFAVIRENZ-EMTRICITAB-TENOFOVIR 600-200-300 MG PO TABS
ORAL_TABLET | ORAL | Status: DC
Start: 2013-12-25 — End: 2014-05-22

## 2014-01-14 ENCOUNTER — Ambulatory Visit: Payer: Self-pay

## 2014-02-09 ENCOUNTER — Other Ambulatory Visit: Payer: Self-pay | Admitting: Internal Medicine

## 2014-02-09 DIAGNOSIS — I10 Essential (primary) hypertension: Secondary | ICD-10-CM

## 2014-02-18 ENCOUNTER — Ambulatory Visit (INDEPENDENT_AMBULATORY_CARE_PROVIDER_SITE_OTHER): Payer: Self-pay | Admitting: *Deleted

## 2014-02-18 ENCOUNTER — Encounter: Payer: Self-pay | Admitting: Internal Medicine

## 2014-02-18 ENCOUNTER — Ambulatory Visit (INDEPENDENT_AMBULATORY_CARE_PROVIDER_SITE_OTHER): Payer: Self-pay | Admitting: Internal Medicine

## 2014-02-18 VITALS — BP 112/75 | HR 93 | Temp 98.7°F | Wt 165.0 lb

## 2014-02-18 DIAGNOSIS — Z23 Encounter for immunization: Secondary | ICD-10-CM

## 2014-02-18 DIAGNOSIS — B20 Human immunodeficiency virus [HIV] disease: Secondary | ICD-10-CM

## 2014-02-18 DIAGNOSIS — I10 Essential (primary) hypertension: Secondary | ICD-10-CM

## 2014-02-18 DIAGNOSIS — K088 Other specified disorders of teeth and supporting structures: Secondary | ICD-10-CM

## 2014-02-18 DIAGNOSIS — K0889 Other specified disorders of teeth and supporting structures: Secondary | ICD-10-CM

## 2014-02-18 NOTE — Progress Notes (Signed)
Patient ID: [name], male   DOB: 1955-05-29, 58 y.o.   MRN: [id number]       Patient ID: [name], male   DOB: 1955-05-29, 58 y.o.   MRN: [id number]  HPI 58yo M with HIV disease, CD 4 count of 660/VL<20 on atripla. dooing well with medications. No recent illnesses. Still has lower tooth pain, not heard from dental clinic. Otherwise in good health  Outpatient Encounter Prescriptions as of 02/18/2014  Medication Sig  . amLODipine (NORVASC) 10 MG tablet Take 1 tablet (10 mg total) by mouth daily.  Marland Kitchen. efavirenz-emtricitabine-tenofovir (ATRIPLA) 600-200-300 MG per tablet TAKE 1 TABLET BY MOUTH AT BEDTIME  . hydrochlorothiazide (HYDRODIURIL) 25 MG tablet TAKE 1 TABLET BY MOUTH DAILY  . lisinopril (PRINIVIL,ZESTRIL) 20 MG tablet Take 1 tablet (20 mg total) by mouth daily.  . megestrol (MEGACE ES) 625 MG/5ML suspension Take 5 mLs (625 mg total) by mouth daily.     Patient Active Problem List   Diagnosis Date Noted  . Urethritis, nongonococcal 03/06/2013  . HYPERLIPIDEMIA 05/02/2007  . WEIGHT LOSS 08/30/2006  . HIV DISEASE 05/30/2006  . HYPERTENSION 05/30/2006  . DENTAL CARIES 05/30/2006  . METHICILLIN RESISTANT STAPHYLOCOCCUS AUREUS INFECTION 10/31/2004     Health Maintenance Due  Topic Date Due  . COLONOSCOPY  09/11/2006  . INFLUENZA VACCINE  11/16/2013     Review of Systems 10 point ros is negative except for tooth pain Physical Exam   BP 112/75 mmHg  Pulse 93  Temp(Src) 98.7 F (37.1 C) (Oral)  Wt 165 lb (74.844 kg) Physical Exam  Constitutional: He is oriented to person, place, and time. He appears well-developed and well-nourished. No distress.  HENT:  Mouth/Throat: Oropharynx is clear and moist. No oropharyngeal exudate.  Cardiovascular: Normal rate, regular rhythm and normal heart sounds. Exam reveals no gallop and no friction rub.  No murmur heard.  Pulmonary/Chest: Effort normal and breath sounds normal. No respiratory distress. He has no wheezes.    Abdominal: Soft. Bowel sounds are normal. He exhibits no distension. There is no tenderness.  Lymphadenopathy:  He has no cervical adenopathy.  Neurological: He is alert and oriented to person, place, and time.  Skin: Skin is warm and dry. No rash noted. No erythema.  Psychiatric: He has a normal mood and affect. His behavior is normal.    Lab Results  Component Value Date   CD4TCELL 35 10/31/2013   Lab Results  Component Value Date   CD4TABS 660 10/31/2013   CD4TABS 630 07/18/2013   CD4TABS 630 11/21/2012   Lab Results  Component Value Date   HIV1RNAQUANT <20 10/31/2013   Lab Results  Component Value Date   HEPBSAB YES 06/12/2006   No results found for: RPR  CBC Lab Results  Component Value Date   WBC 4.8 10/31/2013   RBC 3.64* 10/31/2013   HGB 11.3* 10/31/2013   HCT 32.8* 10/31/2013   PLT 306 10/31/2013   MCV 90.1 10/31/2013   MCH 31.0 10/31/2013   MCHC 34.5 10/31/2013   RDW 14.3 10/31/2013   LYMPHSABS 1.8 10/31/2013   MONOABS 0.4 10/31/2013   EOSABS 0.2 10/31/2013   BASOSABS 0.0 10/31/2013   BMET Lab Results  Component Value Date   NA 140 10/31/2013   K 4.4 10/31/2013   CL 107 10/31/2013   CO2 25 10/31/2013   GLUCOSE 87 10/31/2013   BUN 10 10/31/2013   CREATININE 1.10 10/31/2013   CALCIUM 9.0 10/31/2013   GFRNONAA 70 07/18/2013   GFRAA  81 07/18/2013     Assessment and Plan  hiv = well controlled on atripla. Will get labs 2 wk prior to next visit. Continue on atripla for now  htn = well controlled on lisinopril nad hctz  Health maintenance = will have flu vaccine today  Dental caries =will check where he is on the dental clinic list for getting next appt  rtc in 3 mo

## 2014-03-11 ENCOUNTER — Telehealth: Payer: Self-pay | Admitting: *Deleted

## 2014-03-11 NOTE — Telephone Encounter (Signed)
Patient called c/o boil on his penis that is painful. He said he has had this issue in the past and wanted to walk in to be seen. Advised him we have one available appointment tomorrow  with Dr. Daiva EvesVan Dam. Patient scheduled

## 2014-03-12 ENCOUNTER — Ambulatory Visit (INDEPENDENT_AMBULATORY_CARE_PROVIDER_SITE_OTHER): Payer: Self-pay | Admitting: Infectious Disease

## 2014-03-12 ENCOUNTER — Encounter: Payer: Self-pay | Admitting: Infectious Disease

## 2014-03-12 VITALS — BP 156/89 | HR 84 | Temp 97.8°F | Wt 168.0 lb

## 2014-03-12 DIAGNOSIS — B20 Human immunodeficiency virus [HIV] disease: Secondary | ICD-10-CM

## 2014-03-12 DIAGNOSIS — Z113 Encounter for screening for infections with a predominantly sexual mode of transmission: Secondary | ICD-10-CM

## 2014-03-12 DIAGNOSIS — A630 Anogenital (venereal) warts: Secondary | ICD-10-CM

## 2014-03-12 DIAGNOSIS — A6000 Herpesviral infection of urogenital system, unspecified: Secondary | ICD-10-CM

## 2014-03-12 LAB — COMPLETE METABOLIC PANEL WITH GFR
ALBUMIN: 3.8 g/dL (ref 3.5–5.2)
ALT: 15 U/L (ref 0–53)
AST: 19 U/L (ref 0–37)
Alkaline Phosphatase: 81 U/L (ref 39–117)
BUN: 15 mg/dL (ref 6–23)
CALCIUM: 9.3 mg/dL (ref 8.4–10.5)
CHLORIDE: 107 meq/L (ref 96–112)
CO2: 28 mEq/L (ref 19–32)
Creat: 1.17 mg/dL (ref 0.50–1.35)
GFR, Est African American: 80 mL/min
GFR, Est Non African American: 69 mL/min
Glucose, Bld: 88 mg/dL (ref 70–99)
POTASSIUM: 4.6 meq/L (ref 3.5–5.3)
Sodium: 142 mEq/L (ref 135–145)
Total Bilirubin: 0.3 mg/dL (ref 0.2–1.2)
Total Protein: 7.3 g/dL (ref 6.0–8.3)

## 2014-03-12 LAB — CBC WITH DIFFERENTIAL/PLATELET
BASOS ABS: 0.1 10*3/uL (ref 0.0–0.1)
Basophils Relative: 1 % (ref 0–1)
EOS PCT: 4 % (ref 0–5)
Eosinophils Absolute: 0.2 10*3/uL (ref 0.0–0.7)
HCT: 36.1 % — ABNORMAL LOW (ref 39.0–52.0)
Hemoglobin: 12.1 g/dL — ABNORMAL LOW (ref 13.0–17.0)
Lymphocytes Relative: 39 % (ref 12–46)
Lymphs Abs: 2 10*3/uL (ref 0.7–4.0)
MCH: 30.2 pg (ref 26.0–34.0)
MCHC: 33.5 g/dL (ref 30.0–36.0)
MCV: 90 fL (ref 78.0–100.0)
MONO ABS: 0.6 10*3/uL (ref 0.1–1.0)
MPV: 9.1 fL — ABNORMAL LOW (ref 9.4–12.4)
Monocytes Relative: 11 % (ref 3–12)
Neutro Abs: 2.3 10*3/uL (ref 1.7–7.7)
Neutrophils Relative %: 45 % (ref 43–77)
PLATELETS: 300 10*3/uL (ref 150–400)
RBC: 4.01 MIL/uL — ABNORMAL LOW (ref 4.22–5.81)
RDW: 14 % (ref 11.5–15.5)
WBC: 5.2 10*3/uL (ref 4.0–10.5)

## 2014-03-12 LAB — RPR

## 2014-03-12 MED ORDER — VALACYCLOVIR HCL 500 MG PO TABS: 500.0000 mg | ORAL_TABLET | Freq: Every day | ORAL | Status: DC

## 2014-03-12 MED ORDER — VALACYCLOVIR HCL 1 G PO TABS: 1000.0000 mg | ORAL_TABLET | Freq: Two times a day (BID) | ORAL | Status: DC

## 2014-03-12 NOTE — Patient Instructions (Signed)
Thanks for taking such good care of yourself  I am sending scripts for your condition to pharmacy  Happy THanksgiving!

## 2014-03-12 NOTE — Progress Notes (Signed)
Subjective:    Patient ID: [name], male    DOB: [birth year], 58 y.o.   MRN: [id number]  HPI  58 year old African-American man who has maintained superb virological control of his HIV virus with Atripla with undetectable viral load and healthy CD4 count.  Lab Results  Component Value Date   HIV1RNAQUANT <20 10/31/2013   Lab Results  Component Value Date   CD4TABS 660 10/31/2013   CD4TABS 630 07/18/2013   CD4TABS 630 11/21/2012   Who presents today to clinic acutely due to concerns of raised lesions on the tip of his penis which are painful along with a penile discharge. Discharge is clear in nature. He states that he gets these lesions on the tip of his P penis every few months or so he's never been told what the lesions are. 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  Review of Systems  Constitutional: Negative for fever, chills, diaphoresis, activity change, appetite change, fatigue and unexpected weight change.  HENT: Negative for congestion, rhinorrhea, sinus pressure, sneezing, sore throat and trouble swallowing.   Eyes: Negative for photophobia and visual disturbance.  Respiratory: Negative for cough, chest tightness, shortness of breath, wheezing and stridor.   Cardiovascular: Negative for chest pain, palpitations and leg swelling.  Gastrointestinal: Negative for nausea, vomiting, abdominal pain, diarrhea, constipation, blood in stool, abdominal distention and anal bleeding.  Genitourinary: Positive for discharge, genital sores and penile pain. Negative for dysuria, hematuria, flank pain and difficulty urinating.  Musculoskeletal: Negative for myalgias, back pain, joint swelling, arthralgias and gait problem.  Skin: Negative for color change, pallor, rash and wound.  Neurological: Negative for dizziness, tremors, weakness and light-headedness.  Hematological: Negative for adenopathy. Does not bruise/bleed easily.  Psychiatric/Behavioral: Negative  for behavioral problems, confusion, sleep disturbance, dysphoric mood, decreased concentration and agitation.       Objective:   Physical Exam  Constitutional: He is oriented to person, place, and time. He appears well-developed and well-nourished. No distress.  HENT:  Head: Normocephalic and atraumatic.  Mouth/Throat: Oropharynx is clear and moist. No oropharyngeal exudate.  Eyes: Conjunctivae and EOM are normal. No scleral icterus.  Neck: Normal range of motion. Neck supple. No JVD present.  Cardiovascular: Normal rate and regular rhythm.   Pulmonary/Chest: Effort normal. No respiratory distress. He has no wheezes. He has no rales. He exhibits tenderness.  Abdominal: Soft. He exhibits no distension.  Genitourinary: Circumcised.  See pictures, genital wart on after penis  Raised vesicular lesion on the tip of the penis to the meatal opening with some clear discharge adjacent to this    Musculoskeletal: He exhibits no edema or tenderness.  Lymphadenopathy:    He has no cervical adenopathy.  Neurological: He is alert and oriented to person, place, and time. He exhibits normal muscle tone. Coordination normal.  Skin: Skin is warm and dry. He is not diaphoretic. No erythema. No pallor.  Psychiatric: He has a normal mood and affect. His behavior is normal. Judgment and thought content normal.   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 I will give him Valtrex twice daily for 10 days and place him  on prophylactic Valtrex 500 mg daily to prevent recurrences.  We will also screen him for other sexual transmitted diseases such as syphilis gonorrhea chlamydia.  I spent greater than 25 minutes with the patient  including greater than 50% of time in face to face counsel of the patient and in coordination of their care.   Penile wart: Would consider Aldara and he can take this up when he sees Dr. Drue SecondSnider in February  HIV: perfect control in the past, will check labs today and he can follow-up with Dr. Ilsa IhaSnyder in February and renew his ADAP application at that time.

## 2014-03-14 LAB — URINE CYTOLOGY ANCILLARY ONLY
Chlamydia: POSITIVE — AB
Neisseria Gonorrhea: NEGATIVE

## 2014-03-14 LAB — HIV-1 RNA QUANT-NO REFLEX-BLD: HIV 1 RNA Quant: <20 copies/mL (ref <20)

## 2014-03-14 LAB — HSV 1 ANTIBODY, IGG: HSV 1 Glycoprotein G Ab, IgG: 6.51 IV — ABNORMAL HIGH

## 2014-03-14 LAB — HSV 2 ANTIBODY, IGG: HSV 2 Glycoprotein G Ab, IgG: 9.65 IV — ABNORMAL HIGH

## 2014-03-17 ENCOUNTER — Telehealth: Payer: Self-pay | Admitting: *Deleted

## 2014-03-17 DIAGNOSIS — A749 Chlamydial infection, unspecified: Secondary | ICD-10-CM

## 2014-03-17 MED ORDER — AZITHROMYCIN 250 MG PO TABS: 1000.0000 mg | ORAL_TABLET | Freq: Once | ORAL | Status: DC

## 2014-03-17 NOTE — Telephone Encounter (Signed)
-----   Message from Randall Hissornelius N Van Dam, MD sent at 03/17/2014  8:40 AM EST ----- Mr [name] needs a dose of azithromycin 1 gram to treat his chlamydia. Other option is doxycycline 100mg  twice daily. 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 He needs to have his sexual partners tested and treated

## 2014-03-17 NOTE — Telephone Encounter (Signed)
Relayed information to patient, sent prescription for 1000mg  azithromycin to pharmacy for pick up.  Patient verbalized understanding of all instructions. Andree CossHowell, Cailin Gebel M, RN

## 2014-03-18 LAB — HERPES SIMPLEX VIRUS CULTURE: Organism ID, Bacteria: DETECTED

## 2014-03-20 LAB — HLA B*5701: HLA-B*5701 w/rflx HLA-B High: NEGATIVE

## 2014-05-08 ENCOUNTER — Other Ambulatory Visit (INDEPENDENT_AMBULATORY_CARE_PROVIDER_SITE_OTHER): Payer: Self-pay

## 2014-05-08 DIAGNOSIS — B20 Human immunodeficiency virus [HIV] disease: Secondary | ICD-10-CM

## 2014-05-08 LAB — BASIC METABOLIC PANEL
BUN: 11 mg/dL (ref 6–23)
CO2: 27 meq/L (ref 19–32)
CREATININE: 1.08 mg/dL (ref 0.50–1.35)
Calcium: 9.1 mg/dL (ref 8.4–10.5)
Chloride: 108 mEq/L (ref 96–112)
Glucose, Bld: 93 mg/dL (ref 70–99)
Potassium: 3.9 mEq/L (ref 3.5–5.3)
Sodium: 142 mEq/L (ref 135–145)

## 2014-05-08 LAB — CBC WITH DIFFERENTIAL/PLATELET
Basophils Absolute: 0.1 10*3/uL (ref 0.0–0.1)
Basophils Relative: 1 % (ref 0–1)
EOS ABS: 0.1 10*3/uL (ref 0.0–0.7)
EOS PCT: 2 % (ref 0–5)
HEMATOCRIT: 36.5 % — AB (ref 39.0–52.0)
Hemoglobin: 12.2 g/dL — ABNORMAL LOW (ref 13.0–17.0)
Lymphocytes Relative: 38 % (ref 12–46)
Lymphs Abs: 2.1 10*3/uL (ref 0.7–4.0)
MCH: 30.8 pg (ref 26.0–34.0)
MCHC: 33.4 g/dL (ref 30.0–36.0)
MCV: 92.2 fL (ref 78.0–100.0)
MONOS PCT: 9 % (ref 3–12)
MPV: 9.2 fL (ref 8.6–12.4)
Monocytes Absolute: 0.5 10*3/uL (ref 0.1–1.0)
NEUTROS PCT: 50 % (ref 43–77)
Neutro Abs: 2.7 10*3/uL (ref 1.7–7.7)
PLATELETS: 324 10*3/uL (ref 150–400)
RBC: 3.96 MIL/uL — ABNORMAL LOW (ref 4.22–5.81)
RDW: 14.2 % (ref 11.5–15.5)
WBC: 5.4 10*3/uL (ref 4.0–10.5)

## 2014-05-09 LAB — T-HELPER CELL (CD4) - (RCID CLINIC ONLY)
CD4 % Helper T Cell: 37 % (ref 33–55)
CD4 T Cell Abs: 740 /uL (ref 400–2700)

## 2014-05-12 ENCOUNTER — Other Ambulatory Visit: Payer: Self-pay | Admitting: Internal Medicine

## 2014-05-12 LAB — HIV-1 RNA QUANT-NO REFLEX-BLD: HIV-1 RNA Quant, Log: <1.3 {Log} (ref <1.30)

## 2014-05-20 ENCOUNTER — Ambulatory Visit: Payer: Self-pay

## 2014-05-22 ENCOUNTER — Ambulatory Visit: Payer: Self-pay

## 2014-05-22 ENCOUNTER — Encounter: Payer: Self-pay | Admitting: Internal Medicine

## 2014-05-22 ENCOUNTER — Other Ambulatory Visit: Payer: Self-pay | Admitting: *Deleted

## 2014-05-22 ENCOUNTER — Ambulatory Visit (INDEPENDENT_AMBULATORY_CARE_PROVIDER_SITE_OTHER): Payer: Self-pay | Admitting: Internal Medicine

## 2014-05-22 VITALS — BP 158/92 | HR 80 | Temp 98.0°F | Ht 71.0 in | Wt 164.0 lb

## 2014-05-22 DIAGNOSIS — Z21 Asymptomatic human immunodeficiency virus [HIV] infection status: Secondary | ICD-10-CM

## 2014-05-22 MED ORDER — ELVITEG-COBIC-EMTRICIT-TENOFAF 150-150-200-10 MG PO TABS: 1.0000 | ORAL_TABLET | Freq: Every day | ORAL | Status: DC

## 2014-05-22 NOTE — Progress Notes (Signed)
Patient ID: [name], male   DOB: 06/01/1955, 59 y.o.   MRN: [id number]       Patient ID: [name], male   DOB: 06/01/1955, 59 y.o.   MRN: [id number]  HPI 58yo M with HIV disease on atripla. Cd 4 count of 740/VL<20 doing well. No sleep disturbance although sometimes he forgets and takes the pill the following morning. He states that he is doing well. Denies any difficulty with his health.   Hx of STD in Fall 2015  Outpatient Encounter Prescriptions as of 05/22/2014  Medication Sig  . amLODipine (NORVASC) 10 MG tablet TAKE 1 TABLET BY MOUTH DAILY  . hydrochlorothiazide (HYDRODIURIL) 25 MG tablet TAKE 1 TABLET BY MOUTH DAILY  . lisinopril (PRINIVIL,ZESTRIL) 20 MG tablet Take 1 tablet (20 mg total) by mouth daily.  . megestrol (MEGACE ES) 625 MG/5ML suspension Take 5 mLs (625 mg total) by mouth daily.  . valACYclovir (VALTREX) 500 MG tablet Take 1 tablet (500 mg total) by mouth daily.  . [DISCONTINUED] azithromycin (ZITHROMAX) 250 MG tablet Take 4 tablets (1,000 mg total) by mouth once.  . [DISCONTINUED] efavirenz-emtricitabine-tenofovir (ATRIPLA) 600-200-300 MG per tablet TAKE 1 TABLET BY MOUTH AT BEDTIME  . [DISCONTINUED] valACYclovir (VALTREX) 1000 MG tablet Take 1 tablet (1,000 mg total) by mouth 2 (two) times daily.  Marland Kitchen. elvitegravir-cobicistat-emtricitabine-tenofovir (GENVOYA) 150-150-200-10 MG TABS tablet Take 1 tablet by mouth daily with breakfast.     Patient Active Problem List   Diagnosis Date Noted  . Genital herpes 03/12/2014  . Penile wart 03/12/2014  . Urethritis, nongonococcal 03/06/2013  . HYPERLIPIDEMIA 05/02/2007  . WEIGHT LOSS 08/30/2006  . Human immunodeficiency virus (HIV) disease 05/30/2006  . HYPERTENSION 05/30/2006  . DENTAL CARIES 05/30/2006  . METHICILLIN RESISTANT STAPHYLOCOCCUS AUREUS INFECTION 10/31/2004     Health Maintenance Due  Topic Date Due  . COLONOSCOPY  09/11/2006     Review of Systems  Constitutional: Negative for fever, chills,  diaphoresis, activity change, appetite change, fatigue and unexpected weight change.  HENT: Negative for congestion, sore throat, rhinorrhea, sneezing, trouble swallowing and sinus pressure.  Eyes: Negative for photophobia and visual disturbance.  Respiratory: Negative for cough, chest tightness, shortness of breath, wheezing and stridor.  Cardiovascular: Negative for chest pain, palpitations and leg swelling.  Gastrointestinal: Negative for nausea, vomiting, abdominal pain, diarrhea, constipation, blood in stool, abdominal distention and anal bleeding.  Genitourinary: Negative for dysuria, hematuria, flank pain and difficulty urinating.  Musculoskeletal: Negative for myalgias, back pain, joint swelling, arthralgias and gait problem.  Skin: Negative for color change, pallor, rash and wound.  Neurological: Negative for dizziness, tremors, weakness and light-headedness.  Hematological: Negative for adenopathy. Does not bruise/bleed easily.  Psychiatric/Behavioral: Negative for behavioral problems, confusion, sleep disturbance, dysphoric mood, decreased concentration and agitation.    Physical Exam   BP 158/92 mmHg  Pulse 80  Temp(Src) 98 F (36.7 C) (Oral)  Ht 5\' 11"  (1.803 m)  Wt 164 lb (74.39 kg)  BMI 22.88 kg/m2 Physical Exam  Constitutional: He is oriented to person, place, and time. He appears well-developed and well-nourished. No distress.  HENT:  Mouth/Throat: Oropharynx is clear and moist. No oropharyngeal exudate.  Cardiovascular: Normal rate, regular rhythm and normal heart sounds. Exam reveals no gallop and no friction rub.  No murmur heard.  Pulmonary/Chest: Effort normal and breath sounds normal. No respiratory distress. He has no wheezes.  Abdominal: Soft. Bowel sounds are normal. He exhibits no distension. There is no tenderness.  Lymphadenopathy:  He has no  cervical adenopathy.  Neurological: He is alert and oriented to person, place, and time.  Skin: Skin is warm and  dry. No rash noted. No erythema.  Psychiatric: He has a normal mood and affect. His behavior is normal.    Lab Results  Component Value Date   CD4TCELL 37 05/08/2014   Lab Results  Component Value Date   CD4TABS 740 05/08/2014   CD4TABS 660 10/31/2013   CD4TABS 630 07/18/2013   Lab Results  Component Value Date   HIV1RNAQUANT <20 05/08/2014   Lab Results  Component Value Date   HEPBSAB YES 06/12/2006   No results found for: RPR  CBC Lab Results  Component Value Date   WBC 5.4 05/08/2014   RBC 3.96* 05/08/2014   HGB 12.2* 05/08/2014   HCT 36.5* 05/08/2014   PLT 324 05/08/2014   MCV 92.2 05/08/2014   MCH 30.8 05/08/2014   MCHC 33.4 05/08/2014   RDW 14.2 05/08/2014   LYMPHSABS 2.1 05/08/2014   MONOABS 0.5 05/08/2014   EOSABS 0.1 05/08/2014   BASOSABS 0.1 05/08/2014   BMET Lab Results  Component Value Date   NA 142 05/08/2014   K 3.9 05/08/2014   CL 108 05/08/2014   CO2 27 05/08/2014   GLUCOSE 93 05/08/2014   BUN 11 05/08/2014   CREATININE 1.08 05/08/2014   CALCIUM 9.1 05/08/2014   GFRNONAA 69 03/12/2014   GFRAA 80 03/12/2014     Assessment and Plan  hiv disease= well controlled, we will switch him to genvoya off of atripla in order to minimize bone loss or renal injury with TDF  Hypertension = did not take his meds yet this morning, that would account for elevated BP. If still elevated at next visit, discuss adding amlodipine  Std exposure in the past = will repeat testing at next visit to ensure no exposure to syphiligs, gc, chlam.

## 2014-05-22 NOTE — Telephone Encounter (Signed)
ADAP Application 

## 2014-07-10 ENCOUNTER — Other Ambulatory Visit (INDEPENDENT_AMBULATORY_CARE_PROVIDER_SITE_OTHER): Payer: Self-pay

## 2014-07-10 DIAGNOSIS — Z113 Encounter for screening for infections with a predominantly sexual mode of transmission: Secondary | ICD-10-CM

## 2014-07-10 DIAGNOSIS — B20 Human immunodeficiency virus [HIV] disease: Secondary | ICD-10-CM

## 2014-07-10 LAB — CBC WITH DIFFERENTIAL/PLATELET
BASOS ABS: 0 10*3/uL (ref 0.0–0.1)
Basophils Relative: 1 % (ref 0–1)
EOS PCT: 4 % (ref 0–5)
Eosinophils Absolute: 0.2 10*3/uL (ref 0.0–0.7)
HCT: 34.6 % — ABNORMAL LOW (ref 39.0–52.0)
Hemoglobin: 11.7 g/dL — ABNORMAL LOW (ref 13.0–17.0)
LYMPHS ABS: 1.9 10*3/uL (ref 0.7–4.0)
LYMPHS PCT: 42 % (ref 12–46)
MCH: 31.2 pg (ref 26.0–34.0)
MCHC: 33.8 g/dL (ref 30.0–36.0)
MCV: 92.3 fL (ref 78.0–100.0)
MONO ABS: 0.6 10*3/uL (ref 0.1–1.0)
MPV: 9 fL (ref 8.6–12.4)
Monocytes Relative: 12 % (ref 3–12)
NEUTROS ABS: 1.9 10*3/uL (ref 1.7–7.7)
Neutrophils Relative %: 41 % — ABNORMAL LOW (ref 43–77)
Platelets: 287 10*3/uL (ref 150–400)
RBC: 3.75 MIL/uL — ABNORMAL LOW (ref 4.22–5.81)
RDW: 13.4 % (ref 11.5–15.5)
WBC: 4.6 10*3/uL (ref 4.0–10.5)

## 2014-07-10 LAB — COMPLETE METABOLIC PANEL WITH GFR
ALBUMIN: 3.7 g/dL (ref 3.5–5.2)
ALT: 13 U/L (ref 0–53)
AST: 16 U/L (ref 0–37)
Alkaline Phosphatase: 71 U/L (ref 39–117)
BUN: 12 mg/dL (ref 6–23)
CO2: 27 meq/L (ref 19–32)
Calcium: 9.3 mg/dL (ref 8.4–10.5)
Chloride: 107 mEq/L (ref 96–112)
Creat: 1 mg/dL (ref 0.50–1.35)
GFR, EST NON AFRICAN AMERICAN: 83 mL/min
GLUCOSE: 85 mg/dL (ref 70–99)
POTASSIUM: 4.3 meq/L (ref 3.5–5.3)
SODIUM: 140 meq/L (ref 135–145)
TOTAL PROTEIN: 6.6 g/dL (ref 6.0–8.3)
Total Bilirubin: 0.3 mg/dL (ref 0.2–1.2)

## 2014-07-11 LAB — T-HELPER CELL (CD4) - (RCID CLINIC ONLY)
CD4 % Helper T Cell: 37 % (ref 33–55)
CD4 T CELL ABS: 680 /uL (ref 400–2700)

## 2014-07-11 LAB — RPR

## 2014-07-12 LAB — HIV-1 RNA QUANT-NO REFLEX-BLD
HIV 1 RNA Quant: <20 copies/mL (ref <20)
HIV-1 RNA Quant, Log: <1.3 {Log} (ref <1.30)

## 2014-07-24 ENCOUNTER — Ambulatory Visit: Payer: Self-pay | Admitting: Internal Medicine

## 2014-10-27 ENCOUNTER — Other Ambulatory Visit: Payer: Self-pay | Admitting: Internal Medicine

## 2014-12-16 ENCOUNTER — Other Ambulatory Visit: Payer: Self-pay | Admitting: Licensed Clinical Social Worker

## 2014-12-16 ENCOUNTER — Telehealth: Payer: Self-pay | Admitting: Licensed Clinical Social Worker

## 2014-12-16 ENCOUNTER — Ambulatory Visit (INDEPENDENT_AMBULATORY_CARE_PROVIDER_SITE_OTHER): Payer: Self-pay

## 2014-12-16 DIAGNOSIS — Z113 Encounter for screening for infections with a predominantly sexual mode of transmission: Secondary | ICD-10-CM

## 2014-12-16 DIAGNOSIS — R309 Painful micturition, unspecified: Secondary | ICD-10-CM

## 2014-12-16 NOTE — Telephone Encounter (Signed)
Patient walked in with concerns of random headaches ranging from 1 to 2 times a week. He states that it awakens him at night occasionally. I checked his blood pressure today in clinic it was 103/69 pulse 72. He also complained of tingling during urination after his condom broke with his partner. Per Dr. Drue Second I put an order for UA and GC/CL urine test. I explained to the patient that his test should be back tomorrow and he could call our office to get the results.

## 2014-12-17 ENCOUNTER — Telehealth: Payer: Self-pay | Admitting: *Deleted

## 2014-12-17 LAB — URINALYSIS, MICROSCOPIC ONLY
Bacteria, UA: NONE SEEN [HPF]
CASTS: NONE SEEN [LPF]
CRYSTALS: NONE SEEN [HPF]
Yeast: NONE SEEN [HPF]

## 2014-12-17 LAB — URINALYSIS, ROUTINE W REFLEX MICROSCOPIC
Bilirubin Urine: NEGATIVE
GLUCOSE, UA: NEGATIVE
HGB URINE DIPSTICK: NEGATIVE
Nitrite: NEGATIVE
PROTEIN: NEGATIVE
Specific Gravity, Urine: 1.027 (ref 1.001–1.035)
pH: 5 (ref 5.0–8.0)

## 2014-12-17 LAB — URINE CYTOLOGY ANCILLARY ONLY
CHLAMYDIA, DNA PROBE: POSITIVE — AB
Neisseria Gonorrhea: POSITIVE — AB

## 2014-12-17 NOTE — Telephone Encounter (Signed)
Patient called to get his urine test results from 12/16/14. Advised him he is +GC and Chlamydia and needs to be treated. Advised him to come tomorrow 12/18/14 and would get an order from the doctor to treat him when he gets here. Advised him to tell his partners to be treated as well and to use condoms at every encounters.

## 2014-12-18 ENCOUNTER — Ambulatory Visit (INDEPENDENT_AMBULATORY_CARE_PROVIDER_SITE_OTHER): Payer: Self-pay | Admitting: *Deleted

## 2014-12-18 DIAGNOSIS — A549 Gonococcal infection, unspecified: Secondary | ICD-10-CM

## 2014-12-18 DIAGNOSIS — Z23 Encounter for immunization: Secondary | ICD-10-CM

## 2014-12-18 DIAGNOSIS — A749 Chlamydial infection, unspecified: Secondary | ICD-10-CM

## 2014-12-18 MED ORDER — AZITHROMYCIN 250 MG PO TABS
1000.0000 mg | ORAL_TABLET | Freq: Once | ORAL | Status: AC
  Administered 2014-12-18: 1000 mg via ORAL

## 2014-12-18 MED ORDER — LIDOCAINE HCL 1 % IJ SOLN
250.0000 mg | Freq: Once | INTRAMUSCULAR | Status: AC
  Administered 2014-12-18: 250 mg via INTRAMUSCULAR

## 2014-12-18 NOTE — Telephone Encounter (Signed)
Given verbal order per Dr Drue Second to treat the patient with 1 injection of 250 mg Rocephin and 1 Gram of Azithromycin. The patient is in the clinic today and will receive the medication and condoms as well as informed to let his partners know to be treated as well.

## 2015-03-19 ENCOUNTER — Other Ambulatory Visit: Payer: Self-pay

## 2015-03-19 DIAGNOSIS — Z79899 Other long term (current) drug therapy: Secondary | ICD-10-CM

## 2015-03-19 DIAGNOSIS — B20 Human immunodeficiency virus [HIV] disease: Secondary | ICD-10-CM

## 2015-03-19 DIAGNOSIS — Z113 Encounter for screening for infections with a predominantly sexual mode of transmission: Secondary | ICD-10-CM

## 2015-03-19 LAB — COMPLETE METABOLIC PANEL WITH GFR
ALT: 15 U/L (ref 9–46)
AST: 25 U/L (ref 10–35)
Albumin: 3.7 g/dL (ref 3.6–5.1)
Alkaline Phosphatase: 79 U/L (ref 40–115)
BUN: 14 mg/dL (ref 7–25)
CALCIUM: 9.3 mg/dL (ref 8.6–10.3)
CHLORIDE: 108 mmol/L (ref 98–110)
CO2: 26 mmol/L (ref 20–31)
CREATININE: 1.12 mg/dL (ref 0.70–1.33)
GFR, Est African American: 83 mL/min (ref >=60)
GFR, Est Non African American: 72 mL/min (ref >=60)
GLUCOSE: 71 mg/dL (ref 65–99)
Potassium: 4.5 mmol/L (ref 3.5–5.3)
Sodium: 142 mmol/L (ref 135–146)
Total Bilirubin: 0.4 mg/dL (ref 0.2–1.2)
Total Protein: 7.1 g/dL (ref 6.1–8.1)

## 2015-03-19 LAB — CBC WITH DIFFERENTIAL/PLATELET
BASOS ABS: 0.1 10*3/uL (ref 0.0–0.1)
Basophils Relative: 1 % (ref 0–1)
EOS PCT: 3 % (ref 0–5)
Eosinophils Absolute: 0.2 10*3/uL (ref 0.0–0.7)
HEMATOCRIT: 35.6 % — AB (ref 39.0–52.0)
HEMOGLOBIN: 11.8 g/dL — AB (ref 13.0–17.0)
LYMPHS PCT: 44 % (ref 12–46)
Lymphs Abs: 2.7 10*3/uL (ref 0.7–4.0)
MCH: 30.4 pg (ref 26.0–34.0)
MCHC: 33.1 g/dL (ref 30.0–36.0)
MCV: 91.8 fL (ref 78.0–100.0)
MPV: 9.2 fL (ref 8.6–12.4)
Monocytes Absolute: 0.5 10*3/uL (ref 0.1–1.0)
Monocytes Relative: 9 % (ref 3–12)
NEUTROS ABS: 2.6 10*3/uL (ref 1.7–7.7)
Neutrophils Relative %: 43 % (ref 43–77)
Platelets: 337 10*3/uL (ref 150–400)
RBC: 3.88 MIL/uL — AB (ref 4.22–5.81)
RDW: 13.4 % (ref 11.5–15.5)
WBC: 6.1 10*3/uL (ref 4.0–10.5)

## 2015-03-20 LAB — T-HELPER CELL (CD4) - (RCID CLINIC ONLY)
CD4 % Helper T Cell: 35 % (ref 33–55)
CD4 T CELL ABS: 960 /uL (ref 400–2700)

## 2015-03-22 LAB — HIV-1 RNA QUANT-NO REFLEX-BLD: HIV-1 RNA Quant, Log: <1.3 Log copies/mL (ref <1.30)

## 2015-04-02 ENCOUNTER — Ambulatory Visit (INDEPENDENT_AMBULATORY_CARE_PROVIDER_SITE_OTHER): Payer: Self-pay | Admitting: Internal Medicine

## 2015-04-02 ENCOUNTER — Encounter: Payer: Self-pay | Admitting: Internal Medicine

## 2015-04-02 VITALS — BP 159/92 | HR 71 | Temp 98.6°F | Wt 158.0 lb

## 2015-04-02 DIAGNOSIS — B20 Human immunodeficiency virus [HIV] disease: Secondary | ICD-10-CM

## 2015-04-02 DIAGNOSIS — M7552 Bursitis of left shoulder: Secondary | ICD-10-CM

## 2015-04-02 DIAGNOSIS — H6123 Impacted cerumen, bilateral: Secondary | ICD-10-CM

## 2015-04-02 DIAGNOSIS — I10 Essential (primary) hypertension: Secondary | ICD-10-CM

## 2015-04-02 DIAGNOSIS — R634 Abnormal weight loss: Secondary | ICD-10-CM

## 2015-04-02 MED ORDER — LISINOPRIL 40 MG PO TABS: 40.0000 mg | ORAL_TABLET | Freq: Every day | ORAL | Status: DC

## 2015-04-02 NOTE — Progress Notes (Signed)
Patient ID: [name], male   DOB: 01/16/1956, 59 y.o.   MRN: [id number]       Patient ID: [name], male   DOB: Dec 22, 1955, 59 y.o.   MRN: [id number]  HPI 59yo M with HIV disease on genvoya, CD 4 count of 960/VL<20 (dec 2016). Doing well. He has occ bothersome of left shoulder pain. No recent trauma. 2 wks ago "dug out ear wax" but still feels clogged  Outpatient Encounter Prescriptions as of 04/02/2015  Medication Sig  . amLODipine (NORVASC) 10 MG tablet TAKE 1 TABLET BY MOUTH EVERY DAY  . elvitegravir-cobicistat-emtricitabine-tenofovir (GENVOYA) 150-150-200-10 MG TABS tablet Take 1 tablet by mouth daily with breakfast.  . hydrochlorothiazide (HYDRODIURIL) 25 MG tablet TAKE 1 TABLET BY MOUTH DAILY  . lisinopril (PRINIVIL,ZESTRIL) 20 MG tablet TAKE 1 TABLET BY MOUTH EVERY DAY  . megestrol (MEGACE ES) 625 MG/5ML suspension Take 5 mLs (625 mg total) by mouth daily.  . valACYclovir (VALTREX) 500 MG tablet Take 1 tablet (500 mg total) by mouth daily.   No facility-administered encounter medications on file as of 04/02/2015.     Patient Active Problem List   Diagnosis Date Noted  . Genital herpes 03/12/2014  . Penile wart 03/12/2014  . Urethritis, nongonococcal 03/06/2013  . HYPERLIPIDEMIA 05/02/2007  . WEIGHT LOSS 08/30/2006  . Human immunodeficiency virus (HIV) disease (HCC) 05/30/2006  . HYPERTENSION 05/30/2006  . DENTAL CARIES 05/30/2006  . METHICILLIN RESISTANT STAPHYLOCOCCUS AUREUS INFECTION 10/31/2004     Health Maintenance Due  Topic Date Due  . COLONOSCOPY  09/11/2006     Review of Systems 10 point ros reviewed, positive pertinents listed in hpi Physical Exam   BP 158/94 mmHg  Pulse 74  Temp(Src) 98.6 F (37 C) (Oral)  Wt 158 lb (71.668 kg) Physical Exam  Constitutional: He is oriented to person, place, and time. He appears well-developed and well-nourished. No distress.  HENT: TM bilaterally cerumen impaction Mouth/Throat: Oropharynx is clear and  moist. No oropharyngeal exudate.  Cardiovascular: Normal rate, regular rhythm and normal heart sounds. Exam reveals no gallop and no friction rub.  No murmur heard.  Pulmonary/Chest: Effort normal and breath sounds normal. No respiratory distress. He has no wheezes.  Abdominal: Soft. Bowel sounds are normal. He exhibits no distension. There is no tenderness.  Lymphadenopathy:  He has no cervical adenopathy.  Neurological: He is alert and oriented to person, place, and time.  Skin: Skin is warm and dry. No rash noted. No erythema.  Psychiatric: He has a normal mood and affect. His behavior is normal.    Lab Results  Component Value Date   CD4TCELL 35 03/19/2015   Lab Results  Component Value Date   CD4TABS 960 03/19/2015   CD4TABS 680 07/10/2014   CD4TABS 740 05/08/2014   Lab Results  Component Value Date   HIV1RNAQUANT <20 03/19/2015   Lab Results  Component Value Date   HEPBSAB YES 06/12/2006   No results found for: RPR  CBC Lab Results  Component Value Date   WBC 6.1 03/19/2015   RBC 3.88* 03/19/2015   HGB 11.8* 03/19/2015   HCT 35.6* 03/19/2015   PLT 337 03/19/2015   MCV 91.8 03/19/2015   MCH 30.4 03/19/2015   MCHC 33.1 03/19/2015   RDW 13.4 03/19/2015   LYMPHSABS 2.7 03/19/2015   MONOABS 0.5 03/19/2015   EOSABS 0.2 03/19/2015   BASOSABS 0.1 03/19/2015   BMET Lab Results  Component Value Date   NA 142 03/19/2015   K 4.5  03/19/2015   CL 108 03/19/2015   CO2 26 03/19/2015   GLUCOSE 71 03/19/2015   BUN 14 03/19/2015   CREATININE 1.12 03/19/2015   CALCIUM 9.3 03/19/2015   GFRNONAA 72 03/19/2015   GFRAA 83 03/19/2015     Assessment and Plan   hiv disease = well controlled, continue with genvoya  htn = still not at goal on 3 agents. 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 If it does not work. Will proceed to xray. He needs orange  card application  Cerumen impact bilaterally = will attempt to disimpact and lavage in clinic

## 2015-04-02 NOTE — Progress Notes (Signed)
Wax removal by RN.  Patient [facility] to [facility] as directed on the box.

## 2015-04-09 ENCOUNTER — Other Ambulatory Visit: Payer: Self-pay

## 2015-04-14 ENCOUNTER — Other Ambulatory Visit: Payer: Self-pay

## 2015-05-04 ENCOUNTER — Ambulatory Visit: Payer: Self-pay

## 2015-05-05 ENCOUNTER — Ambulatory Visit: Payer: Self-pay

## 2015-05-14 ENCOUNTER — Other Ambulatory Visit: Payer: Self-pay | Admitting: Internal Medicine

## 2015-05-14 DIAGNOSIS — I10 Essential (primary) hypertension: Secondary | ICD-10-CM

## 2015-06-30 ENCOUNTER — Other Ambulatory Visit: Payer: Self-pay

## 2015-07-02 ENCOUNTER — Other Ambulatory Visit (INDEPENDENT_AMBULATORY_CARE_PROVIDER_SITE_OTHER): Payer: Self-pay

## 2015-07-02 DIAGNOSIS — Z79899 Other long term (current) drug therapy: Secondary | ICD-10-CM

## 2015-07-02 DIAGNOSIS — B20 Human immunodeficiency virus [HIV] disease: Secondary | ICD-10-CM

## 2015-07-02 DIAGNOSIS — Z113 Encounter for screening for infections with a predominantly sexual mode of transmission: Secondary | ICD-10-CM

## 2015-07-02 LAB — CBC WITH DIFFERENTIAL/PLATELET
BASOS PCT: 1 % (ref 0–1)
Basophils Absolute: 0.1 10*3/uL (ref 0.0–0.1)
Eosinophils Absolute: 0.2 10*3/uL (ref 0.0–0.7)
Eosinophils Relative: 3 % (ref 0–5)
HEMATOCRIT: 38.5 % — AB (ref 39.0–52.0)
HEMOGLOBIN: 12.8 g/dL — AB (ref 13.0–17.0)
LYMPHS PCT: 44 % (ref 12–46)
Lymphs Abs: 2.2 10*3/uL (ref 0.7–4.0)
MCH: 30.6 pg (ref 26.0–34.0)
MCHC: 33.2 g/dL (ref 30.0–36.0)
MCV: 92.1 fL (ref 78.0–100.0)
MONO ABS: 0.4 10*3/uL (ref 0.1–1.0)
MONOS PCT: 8 % (ref 3–12)
MPV: 9.5 fL (ref 8.6–12.4)
NEUTROS ABS: 2.2 10*3/uL (ref 1.7–7.7)
NEUTROS PCT: 44 % (ref 43–77)
Platelets: 319 10*3/uL (ref 150–400)
RBC: 4.18 MIL/uL — AB (ref 4.22–5.81)
RDW: 14.1 % (ref 11.5–15.5)
WBC: 5.1 10*3/uL (ref 4.0–10.5)

## 2015-07-02 LAB — LIPID PANEL
CHOL/HDL RATIO: 4.7 ratio (ref <=5.0)
CHOLESTEROL: 223 mg/dL — AB (ref 125–200)
HDL: 47 mg/dL (ref >=40)
LDL Cholesterol: 149 mg/dL — ABNORMAL HIGH (ref <130)
Triglycerides: 137 mg/dL (ref <150)
VLDL: 27 mg/dL (ref <30)

## 2015-07-02 LAB — COMPREHENSIVE METABOLIC PANEL
ALBUMIN: 4 g/dL (ref 3.6–5.1)
ALK PHOS: 71 U/L (ref 40–115)
ALT: 18 U/L (ref 9–46)
AST: 20 U/L (ref 10–35)
BILIRUBIN TOTAL: 0.4 mg/dL (ref 0.2–1.2)
BUN: 12 mg/dL (ref 7–25)
CALCIUM: 9.3 mg/dL (ref 8.6–10.3)
CO2: 27 mmol/L (ref 20–31)
CREATININE: 1.1 mg/dL (ref 0.70–1.33)
Chloride: 106 mmol/L (ref 98–110)
Glucose, Bld: 115 mg/dL — ABNORMAL HIGH (ref 65–99)
Potassium: 4 mmol/L (ref 3.5–5.3)
SODIUM: 140 mmol/L (ref 135–146)
TOTAL PROTEIN: 7.5 g/dL (ref 6.1–8.1)

## 2015-07-03 LAB — MICROALBUMIN / CREATININE URINE RATIO
Creatinine, Urine: 200 mg/dL (ref 20–370)
Microalb Creat Ratio: 4 mcg/mg creat (ref <30)
Microalb, Ur: 0.8 mg/dL

## 2015-07-03 LAB — T-HELPER CELL (CD4) - (RCID CLINIC ONLY)
CD4 T CELL ABS: 750 /uL (ref 400–2700)
CD4 T CELL HELPER: 34 % (ref 33–55)

## 2015-07-03 LAB — HIV-1 RNA QUANT-NO REFLEX-BLD: HIV 1 RNA Quant: <20 copies/mL (ref <20)

## 2015-07-03 LAB — RPR

## 2015-07-03 NOTE — Addendum Note (Signed)
Addended by: Mariea ClontsGREEN, Allura Doepke D on: 07/03/2015 09:20 AM   Modules accepted: Orders

## 2015-07-14 ENCOUNTER — Ambulatory Visit: Payer: Self-pay | Admitting: Internal Medicine

## 2015-08-11 ENCOUNTER — Other Ambulatory Visit: Payer: Self-pay | Admitting: *Deleted

## 2015-08-11 ENCOUNTER — Encounter: Payer: Self-pay | Admitting: Internal Medicine

## 2015-08-11 ENCOUNTER — Ambulatory Visit (INDEPENDENT_AMBULATORY_CARE_PROVIDER_SITE_OTHER): Payer: Self-pay | Admitting: Internal Medicine

## 2015-08-11 VITALS — BP 153/90 | HR 66 | Temp 98.1°F | Ht 71.0 in | Wt 176.8 lb

## 2015-08-11 DIAGNOSIS — B20 Human immunodeficiency virus [HIV] disease: Secondary | ICD-10-CM

## 2015-08-11 DIAGNOSIS — A6000 Herpesviral infection of urogenital system, unspecified: Secondary | ICD-10-CM

## 2015-08-11 DIAGNOSIS — E785 Hyperlipidemia, unspecified: Secondary | ICD-10-CM

## 2015-08-11 DIAGNOSIS — Z21 Asymptomatic human immunodeficiency virus [HIV] infection status: Secondary | ICD-10-CM

## 2015-08-11 MED ORDER — ELVITEG-COBIC-EMTRICIT-TENOFAF 150-150-200-10 MG PO TABS: 1.0000 | ORAL_TABLET | Freq: Every day | ORAL | Status: DC

## 2015-08-11 MED ORDER — VALACYCLOVIR HCL 500 MG PO TABS: 500.0000 mg | ORAL_TABLET | Freq: Every day | ORAL | Status: DC

## 2015-08-11 MED ORDER — MEGESTROL ACETATE 625 MG/5ML PO SUSP: 625.0000 mg | Freq: Every day | ORAL | Status: DC

## 2015-08-11 MED ORDER — ROSUVASTATIN CALCIUM 10 MG PO TABS: 10.0000 mg | ORAL_TABLET | Freq: Every day | ORAL | Status: DC

## 2015-08-11 MED ORDER — ASPIRIN 81 MG PO CHEW: 81.0000 mg | CHEWABLE_TABLET | Freq: Every day | ORAL | Status: DC

## 2015-08-11 NOTE — Progress Notes (Signed)
Patient ID: [name], male   DOB: 11-11-1955, 60 y.o.   MRN: [id number]       Patient ID: [name], male   DOB: 11-11-1955, 60 y.o.   MRN: [id number]  HPI  Cd 4 count of 750VL<20 in march 2017. On genvoya. He reports doing well. Not missing doses. No complaints on his health, except some skin lesions to his scrotum. Outpatient Encounter Prescriptions as of 08/11/2015  Medication Sig  . amLODipine (NORVASC) 10 MG tablet TAKE 1 TABLET BY MOUTH EVERY DAY  . elvitegravir-cobicistat-emtricitabine-tenofovir (GENVOYA) 150-150-200-10 MG TABS tablet Take 1 tablet by mouth daily with breakfast.  . hydrochlorothiazide (HYDRODIURIL) 25 MG tablet TAKE 1 TABLET BY MOUTH DAILY  . lisinopril (PRINIVIL,ZESTRIL) 40 MG tablet Take 1 tablet (40 mg total) by mouth daily.  . megestrol (MEGACE ES) 625 MG/5ML suspension Take 5 mLs (625 mg total) by mouth daily.  . valACYclovir (VALTREX) 500 MG tablet Take 1 tablet (500 mg total) by mouth daily.  . [DISCONTINUED] megestrol (MEGACE ES) 625 MG/5ML suspension Take 5 mLs (625 mg total) by mouth daily.  . [DISCONTINUED] valACYclovir (VALTREX) 500 MG tablet Take 1 tablet (500 mg total) by mouth daily.   No facility-administered encounter medications on file as of 08/11/2015.     Patient Active Problem List   Diagnosis Date Noted  . Genital herpes 03/12/2014  . Penile wart 03/12/2014  . Urethritis, nongonococcal 03/06/2013  . HYPERLIPIDEMIA 05/02/2007  . WEIGHT LOSS 08/30/2006  . Human immunodeficiency virus (HIV) disease (HCC) 05/30/2006  . HYPERTENSION 05/30/2006  . DENTAL CARIES 05/30/2006  . METHICILLIN RESISTANT STAPHYLOCOCCUS AUREUS INFECTION 10/31/2004     Health Maintenance Due  Topic Date Due  . COLONOSCOPY  09/11/2006     Review of Systems 10 point ros is negative Physical Exam   BP 164/100 mmHg  Pulse 71  Temp(Src) 98.1 F (36.7 C) (Oral)  Ht 5\' 11"  (1.803 m)  Wt 176 lb 12 oz (80.173 kg)  BMI 24.66 kg/m2 Physical Exam    Constitutional: He is oriented to person, place, and time. He appears well-developed and well-nourished. No distress.  HENT:  Mouth/Throat: Oropharynx is clear and moist. No oropharyngeal exudate.  Cardiovascular: Normal rate, regular rhythm and normal heart sounds. Exam reveals no gallop and no friction rub.  No murmur heard.  Pulmonary/Chest: Effort normal and breath sounds normal. No respiratory distress. He has no wheezes.  Abdominal: Soft. Bowel sounds are normal. He exhibits no distension. There is no tenderness.  Lymphadenopathy:  He has no cervical adenopathy.  Neurological: He is alert and oriented to person, place, and time.  GU exam = appears to have small shallow ulcer on scrotum c/w herpes Skin: Skin is warm and dry. Psychiatric: He has a normal mood and affect. His behavior is normal.    Lab Results  Component Value Date   CD4TCELL 34 07/02/2015   Lab Results  Component Value Date   CD4TABS 750 07/02/2015   CD4TABS 960 03/19/2015   CD4TABS 680 07/10/2014   Lab Results  Component Value Date   HIV1RNAQUANT <20 07/02/2015   Lab Results  Component Value Date   HEPBSAB YES 06/12/2006   No results found for: RPR  CBC Lab Results  Component Value Date   WBC 5.1 07/02/2015   RBC 4.18* 07/02/2015   HGB 12.8* 07/02/2015   HCT 38.5* 07/02/2015   PLT 319 07/02/2015   MCV 92.1 07/02/2015   MCH 30.6 07/02/2015   MCHC 33.2 07/02/2015  RDW 14.1 07/02/2015   LYMPHSABS 2.2 07/02/2015   MONOABS 0.4 07/02/2015   EOSABS 0.2 07/02/2015   BASOSABS 0.1 07/02/2015   BMET Lab Results  Component Value Date   NA 140 07/02/2015   K 4.0 07/02/2015   CL 106 07/02/2015   CO2 27 07/02/2015   GLUCOSE 115* 07/02/2015   BUN 12 07/02/2015   CREATININE 1.10 07/02/2015   CALCIUM 9.3 07/02/2015   GFRNONAA 72 03/19/2015   GFRAA 83 03/19/2015     Assessment and Plan hiv = well contorlled, continue with current regimen  htn = will need to repeat BP @ sbp 153/90. Already  on 3 agents. Ask to do diet modification  Familial hx of CAD = will start asa  hld = will start on rosuvastatin  at bedtime  Health maintenance = csy?Marland Kitchen Will apply for orange card to see if he can get access to colonoscopy  Herpes/genitalia =will give course of valtrex

## 2015-08-19 ENCOUNTER — Other Ambulatory Visit: Payer: Self-pay | Admitting: *Deleted

## 2015-08-19 DIAGNOSIS — Z21 Asymptomatic human immunodeficiency virus [HIV] infection status: Secondary | ICD-10-CM

## 2015-08-19 MED ORDER — ELVITEG-COBIC-EMTRICIT-TENOFAF 150-150-200-10 MG PO TABS: 1.0000 | ORAL_TABLET | Freq: Every day | ORAL | Status: DC

## 2015-08-24 ENCOUNTER — Encounter: Payer: Self-pay | Admitting: Internal Medicine

## 2015-11-24 ENCOUNTER — Other Ambulatory Visit: Payer: Self-pay | Admitting: Internal Medicine

## 2016-01-12 ENCOUNTER — Other Ambulatory Visit: Payer: Self-pay | Admitting: Infectious Diseases

## 2016-01-12 DIAGNOSIS — I10 Essential (primary) hypertension: Secondary | ICD-10-CM

## 2016-02-01 ENCOUNTER — Ambulatory Visit: Payer: Self-pay

## 2016-05-10 ENCOUNTER — Other Ambulatory Visit: Payer: Self-pay

## 2016-05-10 ENCOUNTER — Ambulatory Visit: Payer: Self-pay

## 2016-05-24 ENCOUNTER — Ambulatory Visit: Payer: Self-pay | Admitting: Internal Medicine

## 2016-06-13 ENCOUNTER — Ambulatory Visit: Payer: Self-pay

## 2016-07-19 ENCOUNTER — Other Ambulatory Visit: Payer: Self-pay | Admitting: Internal Medicine

## 2016-07-19 ENCOUNTER — Other Ambulatory Visit (INDEPENDENT_AMBULATORY_CARE_PROVIDER_SITE_OTHER): Payer: Self-pay

## 2016-07-19 ENCOUNTER — Ambulatory Visit: Payer: Self-pay

## 2016-07-19 DIAGNOSIS — B20 Human immunodeficiency virus [HIV] disease: Secondary | ICD-10-CM

## 2016-07-19 DIAGNOSIS — Z113 Encounter for screening for infections with a predominantly sexual mode of transmission: Secondary | ICD-10-CM

## 2016-07-19 DIAGNOSIS — Z79899 Other long term (current) drug therapy: Secondary | ICD-10-CM

## 2016-07-19 LAB — LIPID PANEL
Cholesterol: 138 mg/dL (ref <200)
HDL: 22 mg/dL — ABNORMAL LOW (ref >40)
LDL CALC: 85 mg/dL (ref <100)
Total CHOL/HDL Ratio: 6.3 Ratio — ABNORMAL HIGH (ref <5.0)
Triglycerides: 154 mg/dL — ABNORMAL HIGH (ref <150)
VLDL: 31 mg/dL — ABNORMAL HIGH (ref <30)

## 2016-07-19 LAB — COMPREHENSIVE METABOLIC PANEL
ALK PHOS: 58 U/L (ref 40–115)
ALT: 12 U/L (ref 9–46)
AST: 19 U/L (ref 10–35)
Albumin: 3.1 g/dL — ABNORMAL LOW (ref 3.6–5.1)
BILIRUBIN TOTAL: 0.2 mg/dL (ref 0.2–1.2)
BUN: 8 mg/dL (ref 7–25)
CO2: 22 mmol/L (ref 20–31)
Calcium: 8.6 mg/dL (ref 8.6–10.3)
Chloride: 105 mmol/L (ref 98–110)
Creat: 1.11 mg/dL (ref 0.70–1.33)
GLUCOSE: 90 mg/dL (ref 65–99)
Potassium: 4 mmol/L (ref 3.5–5.3)
Sodium: 136 mmol/L (ref 135–146)
Total Protein: 6.9 g/dL (ref 6.1–8.1)

## 2016-07-19 LAB — CBC WITH DIFFERENTIAL/PLATELET
BASOS ABS: 47 {cells}/uL (ref 0–200)
Basophils Relative: 1 %
EOS PCT: 1 %
Eosinophils Absolute: 47 cells/uL (ref 15–500)
HEMATOCRIT: 33.4 % — AB (ref 38.5–50.0)
HEMOGLOBIN: 11.3 g/dL — AB (ref 13.2–17.1)
LYMPHS ABS: 2538 {cells}/uL (ref 850–3900)
LYMPHS PCT: 54 %
MCH: 30.7 pg (ref 27.0–33.0)
MCHC: 33.8 g/dL (ref 32.0–36.0)
MCV: 90.8 fL (ref 80.0–100.0)
MONO ABS: 564 {cells}/uL (ref 200–950)
MPV: 8.3 fL (ref 7.5–12.5)
Monocytes Relative: 12 %
NEUTROS PCT: 32 %
Neutro Abs: 1504 cells/uL (ref 1500–7800)
PLATELETS: 265 10*3/uL (ref 140–400)
RBC: 3.68 MIL/uL — AB (ref 4.20–5.80)
RDW: 13.2 % (ref 11.0–15.0)
WBC: 4.7 10*3/uL (ref 3.8–10.8)

## 2016-07-20 ENCOUNTER — Encounter: Payer: Self-pay | Admitting: Internal Medicine

## 2016-07-20 LAB — T-HELPER CELL (CD4) - (RCID CLINIC ONLY)
CD4 T CELL ABS: 480 /uL (ref 400–2700)
CD4 T CELL HELPER: 19 % — AB (ref 33–55)

## 2016-07-20 LAB — RPR

## 2016-07-20 LAB — URINE CYTOLOGY ANCILLARY ONLY
Chlamydia: NEGATIVE
Neisseria Gonorrhea: NEGATIVE

## 2016-07-21 LAB — HIV-1 RNA QUANT-NO REFLEX-BLD
HIV 1 RNA Quant: 179000 copies/mL — ABNORMAL HIGH
HIV-1 RNA Quant, Log: 5.25 Log copies/mL — ABNORMAL HIGH

## 2016-08-03 ENCOUNTER — Other Ambulatory Visit: Payer: Self-pay | Admitting: Internal Medicine

## 2016-08-03 DIAGNOSIS — I10 Essential (primary) hypertension: Secondary | ICD-10-CM

## 2016-08-05 ENCOUNTER — Telehealth: Payer: Self-pay | Admitting: *Deleted

## 2016-08-05 NOTE — Telephone Encounter (Signed)
[facility] calling to confirm regimen, as patient has not filled 01/13/16.   He has not been seen in 1 year, did not renew ADAP between 9/30 and 4/1.  His ADAP is now approved.  RN authorized 30 day refill of hypertension medications, advised Dr Drue Second would need to refill HIV medication at his visit 5/1. Andree Coss, RN

## 2016-08-16 ENCOUNTER — Encounter: Payer: Self-pay | Admitting: Internal Medicine

## 2016-08-16 ENCOUNTER — Ambulatory Visit (INDEPENDENT_AMBULATORY_CARE_PROVIDER_SITE_OTHER): Payer: Self-pay | Admitting: Internal Medicine

## 2016-08-16 VITALS — BP 151/84 | HR 103 | Temp 98.6°F | Ht 72.0 in | Wt 159.0 lb

## 2016-08-16 DIAGNOSIS — B20 Human immunodeficiency virus [HIV] disease: Secondary | ICD-10-CM

## 2016-08-16 DIAGNOSIS — R591 Generalized enlarged lymph nodes: Secondary | ICD-10-CM

## 2016-08-16 DIAGNOSIS — I10 Essential (primary) hypertension: Secondary | ICD-10-CM

## 2016-08-16 LAB — COMPLETE METABOLIC PANEL WITH GFR
ALT: 26 U/L (ref 9–46)
AST: 27 U/L (ref 10–35)
Albumin: 3.5 g/dL — ABNORMAL LOW (ref 3.6–5.1)
Alkaline Phosphatase: 71 U/L (ref 40–115)
BILIRUBIN TOTAL: 0.3 mg/dL (ref 0.2–1.2)
BUN: 11 mg/dL (ref 7–25)
CHLORIDE: 103 mmol/L (ref 98–110)
CO2: 25 mmol/L (ref 20–31)
CREATININE: 1.14 mg/dL (ref 0.70–1.33)
Calcium: 9 mg/dL (ref 8.6–10.3)
GFR, EST AFRICAN AMERICAN: 81 mL/min (ref >=60)
GFR, Est Non African American: 70 mL/min (ref >=60)
Glucose, Bld: 100 mg/dL — ABNORMAL HIGH (ref 65–99)
Potassium: 4 mmol/L (ref 3.5–5.3)
Sodium: 136 mmol/L (ref 135–146)
TOTAL PROTEIN: 8.2 g/dL — AB (ref 6.1–8.1)

## 2016-08-16 MED ORDER — EMTRICITABINE-TENOFOVIR AF 200-25 MG PO TABS: 1.0000 | ORAL_TABLET | Freq: Every day | ORAL | 5 refills | Status: DC

## 2016-08-16 MED ORDER — DOLUTEGRAVIR SODIUM 50 MG PO TABS: 50.0000 mg | ORAL_TABLET | Freq: Every day | ORAL | 5 refills | Status: DC

## 2016-08-16 NOTE — Progress Notes (Signed)
RFV: follow up for hiv disease  Patient ID: [name], male   DOB: 1955/04/24, 61 y.o.   MRN: [id number]  HPI 61yo M with hiv disease, CD 4 count of 480/VL180K, (apirl 2018) previously on genvoya. He was last seen 1 year ago. He reports that he stopped his hiv medications, ran out in December though our records at pharmacy shows that he did not pick up since September, and he stated that he had a few months supply on hand. He has noticed some new onset fevers at night, weight loss. Unintentional since being off of meds.  He has this new onset left sided submandibular/parotid LN swelling that is more pronounced in the last few months  Social History  Substance Use Topics  . Smoking status: Never Smoker  . Smokeless tobacco: Never Used  . Alcohol use 0.0 oz/week     Comment: occ   I have reviewed his records in [facility] link  Outpatient Encounter Prescriptions as of 08/16/2016  Medication Sig  . amLODipine (NORVASC) 10 MG tablet TAKE 1 TABLET BY MOUTH EVERY DAY  . aspirin 81 MG chewable tablet Chew 1 tablet (81 mg total) by mouth daily.  Marland Kitchen elvitegravir-cobicistat-emtricitabine-tenofovir (GENVOYA) 150-150-200-10 MG TABS tablet Take 1 tablet by mouth daily with breakfast.  . hydrochlorothiazide (HYDRODIURIL) 25 MG tablet TAKE 1 TABLET BY MOUTH DAILY  . lisinopril (PRINIVIL,ZESTRIL) 40 MG tablet TAKE 1 TABLET(40 MG) BY MOUTH DAILY  . megestrol (MEGACE ES) 625 MG/5ML suspension Take 5 mLs (625 mg total) by mouth daily.  . rosuvastatin (CRESTOR) 10 MG tablet Take 1 tablet (10 mg total) by mouth at bedtime.  . valACYclovir (VALTREX) 500 MG tablet Take 1 tablet (500 mg total) by mouth daily.   No facility-administered encounter medications on file as of 08/16/2016.      Patient Active Problem List   Diagnosis Date Noted  . Genital herpes 03/12/2014  . Penile wart 03/12/2014  . Urethritis, nongonococcal 03/06/2013  . HYPERLIPIDEMIA 05/02/2007  . WEIGHT LOSS 08/30/2006  . Human  immunodeficiency virus (HIV) disease ([place]) 05/30/2006  . HYPERTENSION 05/30/2006  . DENTAL CARIES 05/30/2006  . METHICILLIN RESISTANT STAPHYLOCOCCUS AUREUS INFECTION 10/31/2004     Health Maintenance Due  Topic Date Due  . COLONOSCOPY  09/11/2006     Review of Systems Per hpi, otherwise 12 point ros is negative Physical Exam   BP (!) 151/84   Pulse (!) 103   Temp 98.6 F (37 C) (Oral)   Ht 6' (1.829 m)   Wt 159 lb (72.1 kg)   BMI 21.56 kg/m  Physical Exam  Constitutional: He is oriented to person, place, and time. He appears well-developed and well-nourished. No distress.  HENT:  Mouth/Throat: Oropharynx is clear and moist. No oropharyngeal exudate.  Neck:4 cm long by 3 cm wide submandibular.angle of jaw firm LN Cardiovascular: Normal rate, regular rhythm and normal heart sounds. Exam reveals no gallop and no friction rub.  No murmur heard.  Pulmonary/Chest: Effort normal and breath sounds normal. No respiratory distress. He has no wheezes.  Abdominal: Soft. Bowel sounds are normal. He exhibits no distension. There is no tenderness.  Lymphadenopathy:  He has no cervical adenopathy.  Neurological: He is alert and oriented to person, place, and time.  Skin: Skin is warm and dry. No rash noted. No erythema.  Psychiatric: He has a normal mood and affect. His behavior is normal.     Lab Results  Component Value Date   CD4TCELL 19 (L) 07/19/2016  Lab Results  Component Value Date   CD4TABS 480 07/19/2016   CD4TABS 750 07/02/2015   CD4TABS 960 03/19/2015   Lab Results  Component Value Date   HIV1RNAQUANT 179,000 (H) 07/19/2016   Lab Results  Component Value Date   HEPBSAB YES 06/12/2006   Lab Results  Component Value Date   LABRPR NON REAC 07/19/2016    CBC Lab Results  Component Value Date   WBC 4.7 07/19/2016   RBC 3.68 (L) 07/19/2016   HGB 11.3 (L) 07/19/2016   HCT 33.4 (L) 07/19/2016   PLT 265 07/19/2016   MCV 90.8 07/19/2016   MCH 30.7  07/19/2016   MCHC 33.8 07/19/2016   RDW 13.2 07/19/2016   LYMPHSABS 2,538 07/19/2016   MONOABS 564 07/19/2016   EOSABS 47 07/19/2016    BMET Lab Results  Component Value Date   NA 136 07/19/2016   K 4.0 07/19/2016   CL 105 07/19/2016   CO2 22 07/19/2016   GLUCOSE 90 07/19/2016   BUN 8 07/19/2016   CREATININE 1.11 07/19/2016   CALCIUM 8.6 07/19/2016   GFRNONAA 72 03/19/2015   GFRAA 83 03/19/2015      Assessment and Plan  hiv = will start descovy plus tivicay  htn = currently 151/84 but has not taken his HTN meds  Lymphadenopathy plus weight loss = concern for extrapulm TB, MAC or lymphoma. 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 Will ask to go to hospital to get low income discount and have him have go to IR for core biopsy. Will get cxr

## 2016-08-17 LAB — CBC WITH DIFFERENTIAL/PLATELET
BASOS PCT: 1 %
Basophils Absolute: 77 cells/uL (ref 0–200)
EOS ABS: 77 {cells}/uL (ref 15–500)
Eosinophils Relative: 1 %
HEMATOCRIT: 33.6 % — AB (ref 38.5–50.0)
Hemoglobin: 11.4 g/dL — ABNORMAL LOW (ref 13.2–17.1)
Lymphocytes Relative: 54 %
Lymphs Abs: 4158 cells/uL — ABNORMAL HIGH (ref 850–3900)
MCH: 31.1 pg (ref 27.0–33.0)
MCHC: 33.9 g/dL (ref 32.0–36.0)
MCV: 91.8 fL (ref 80.0–100.0)
MONO ABS: 1001 {cells}/uL — AB (ref 200–950)
MONOS PCT: 13 %
MPV: 9 fL (ref 7.5–12.5)
NEUTROS ABS: 2387 {cells}/uL (ref 1500–7800)
Neutrophils Relative %: 31 %
PLATELETS: 323 10*3/uL (ref 140–400)
RBC: 3.66 MIL/uL — AB (ref 4.20–5.80)
RDW: 14.1 % (ref 11.0–15.0)
WBC: 7.7 10*3/uL (ref 3.8–10.8)

## 2016-08-17 LAB — PATHOLOGIST SMEAR REVIEW

## 2016-08-17 NOTE — Progress Notes (Signed)
HPI: [name] is a 61 y.o. male who presents to the [facility] clinic today for follow-up with Dr. Baxter Flattery for his HIV infection.  Allergies: No Known Allergies  Past Medical History: Past Medical History:  Diagnosis Date  . HIV infection ([place])   . Hypertension     Social History: Social History   Social History  . Marital status: Single    Spouse name: N/A  . Number of children: N/A  . Years of education: N/A   Social History Main Topics  . Smoking status: Never Smoker  . Smokeless tobacco: Never Used  . Alcohol use 0.0 oz/week     Comment: occ  . Drug use: Yes    Frequency: 3.0 times per week    Types: Marijuana  . Sexual activity: Yes     Comment: pt. given condoms   Other Topics Concern  . None   Social History Narrative  . None    Current Regimen: Genvoya - out of meds  Labs: HIV 1 RNA Quant (copies/mL)  Date Value  07/19/2016 179,000 (H)  07/02/2015 <20  03/19/2015 <20   CD4 T Cell Abs (/uL)  Date Value  07/19/2016 480  07/02/2015 750  03/19/2015 960   Hep B S Ab (no units)  Date Value  06/12/2006 YES   Hepatitis B Surface Ag (no units)  Date Value  06/12/2006 NO   HCV Ab (no units)  Date Value  06/12/2006 NO    CrCl: Estimated Creatinine Clearance: 71.2 mL/min (by C-G formula based on SCr of 1.14 mg/dL).  Lipids:    Component Value Date/Time   CHOL 138 07/19/2016 1502   TRIG 154 (H) 07/19/2016 1502   HDL 22 (L) 07/19/2016 1502   CHOLHDL 6.3 (H) 07/19/2016 1502   VLDL 31 (H) 07/19/2016 1502   LDLCALC 85 07/19/2016 1502    Assessment: [name] is here today for follow-up with Dr. Baxter Flattery. Pt states he has been out of Genvoya since December and tried to call here to be seen but was told there was no openings and we couldn't help (?). Will restart his medications today.  His ADAP is approved. We will start him on Tivicay and Descovy for now since it has a higher barrier to resistance and possibly change him over to Calpine once he  gets suppressed.  Explained the medications to him and how to take them.  I will send them to [facility] as that is what he prefers.  He is also complaining of weight loss, a large nodule on the side of his neck, and recent sickness (he thinks he had the flu but it was never diagnosed).  Dr. Baxter Flattery will get chest Xray and biopsy of nodule.  Concerned for TB, MAC, etc.  Will follow-up on results.  He will come back and see me in a few weeks to assess adherence and get repeat labs.   Plans: - Start Tivicay 50 mg PO once daily - Start Descovy 200-25 mg PO once daily - F/u on CXR and biopsy - F/u with me 5/21 at 9am - F/u with Dr. Baxter Flattery 6/4 at 3:45pm  [name] L. [name], PharmD, [facility] 08/17/2016, 11:12 AM

## 2016-08-18 LAB — QUANTIFERON TB GOLD ASSAY (BLOOD)
Interferon Gamma Release Assay: NEGATIVE
Mitogen-Nil: 9.37 IU/mL
Quantiferon Nil Value: 0.07 IU/mL
Quantiferon Tb Ag Minus Nil Value: 0.02 IU/mL

## 2016-09-05 ENCOUNTER — Ambulatory Visit (INDEPENDENT_AMBULATORY_CARE_PROVIDER_SITE_OTHER): Payer: Self-pay | Admitting: Pharmacist

## 2016-09-05 ENCOUNTER — Other Ambulatory Visit: Payer: Self-pay | Admitting: Internal Medicine

## 2016-09-05 ENCOUNTER — Telehealth: Payer: Self-pay | Admitting: *Deleted

## 2016-09-05 DIAGNOSIS — I10 Essential (primary) hypertension: Secondary | ICD-10-CM

## 2016-09-05 DIAGNOSIS — B20 Human immunodeficiency virus [HIV] disease: Secondary | ICD-10-CM

## 2016-09-05 NOTE — Telephone Encounter (Signed)
[facility] calling for confirmation of hypertension regimen. Patient is not sure which one he needs. Patient scheduled for follow up 6/4, where this will be clarified. Andree CossHowell, Achillies Buehl M, RN

## 2016-09-05 NOTE — Progress Notes (Signed)
HPI: [name] is a 61 y.o. male who presents to the [facility] pharmacy clinic for HIV follow-up.    Allergies: No Known Allergies  Past Medical History: Past Medical History:  Diagnosis Date  . HIV infection (HCC)   . Hypertension     Social History: Social History   Social History  . Marital status: Single    Spouse name: N/A  . Number of children: N/A  . Years of education: N/A   Social History Main Topics  . Smoking status: Never Smoker  . Smokeless tobacco: Never Used  . Alcohol use 0.0 oz/week     Comment: occ  . Drug use: Yes    Frequency: 3.0 times per week    Types: Marijuana  . Sexual activity: Yes     Comment: pt. given condoms   Other Topics Concern  . Not on file   Social History Narrative  . No narrative on file    Current Regimen: Tivicay + Descovy  Labs: HIV 1 RNA Quant (copies/mL)  Date Value  07/19/2016 179,000 (H)  07/02/2015 <20  03/19/2015 <20   CD4 T Cell Abs (/uL)  Date Value  07/19/2016 480  07/02/2015 750  03/19/2015 960   Hep B S Ab (no units)  Date Value  06/12/2006 YES   Hepatitis B Surface Ag (no units)  Date Value  06/12/2006 NO   HCV Ab (no units)  Date Value  06/12/2006 NO    CrCl: CrCl cannot be calculated (Unknown ideal weight.).  Lipids:    Component Value Date/Time   CHOL 138 07/19/2016 1502   TRIG 154 (H) 07/19/2016 1502   HDL 22 (L) 07/19/2016 1502   CHOLHDL 6.3 (H) 07/19/2016 1502   VLDL 31 (H) 07/19/2016 1502   LDLCALC 85 07/19/2016 1502    Assessment: [name] is here today to follow-up for his HIV after seeing Dr. Drue SecondSnider ~3 weeks ago when he ran out of medications x 5 months.  He was supposed to start back on medications through [facility], but he tells me he still has not started back on them.  He has a lot of excuses today saying that [facility] [place] (he wanted them mailed from their [facility] pharmacy) has never called him to mail them to him. He also states that he thought about  calling but just didn't.  I told him this was unacceptable and he needs to take ownership of his medications and make sure he starts them again.  I called [facility] and they have documented that they tried to call him 4 times between May 2nd and May 4th and left voicemails to reach back out to them.  [name] states he never got the calls.  [facility] state they will contact him today again and try to start the process.  I made sure to tell [name] to call them first thing tomorrow morning if they haven't reached out to him by end of day today.  I will call tomorrow and make sure everything is cleared up.  The number for the [facility] portion of [facility] is [phone].  He still has the nodule on his neck and has not gotten a chest xray or a biopsy. He states that he got paperwork to fill out for both procedures but doesn't know how to fill the paperwork out. I told him he really needs to get that figured out too.  He will go there and try again.   Plans: - Will make sure medications are sent  from [facility] - F/u with Dr. Drue Second 6/4 at 3:45pm  Jill Stopka L. Clarity Ciszek, PharmD, CPP Infectious Diseases Clinical Pharmacist [facility] 09/05/2016, 3:56 PM

## 2016-09-06 ENCOUNTER — Other Ambulatory Visit: Payer: Self-pay | Admitting: *Deleted

## 2016-09-06 DIAGNOSIS — I10 Essential (primary) hypertension: Secondary | ICD-10-CM

## 2016-09-06 MED ORDER — AMLODIPINE BESYLATE 10 MG PO TABS: 10.0000 mg | ORAL_TABLET | Freq: Every day | ORAL | 0 refills | Status: DC

## 2016-09-06 MED ORDER — HYDROCHLOROTHIAZIDE 25 MG PO TABS: 25.0000 mg | ORAL_TABLET | Freq: Every day | ORAL | 0 refills | Status: DC

## 2016-09-06 MED ORDER — LISINOPRIL 40 MG PO TABS: ORAL_TABLET | ORAL | 0 refills | Status: DC

## 2016-09-06 NOTE — Telephone Encounter (Signed)
He saw pharmacy yesterday, stated to RN today that he does take all 3 blood pressure medications.  I sent in 1 month of each, notifying him that this will be confirmed/addressed at his appointment on 6/4.

## 2016-09-06 NOTE — Telephone Encounter (Signed)
I worry about neurocognition as well. Can you have him come to clinic with his meds and do med check with cassie or minh

## 2016-09-19 ENCOUNTER — Telehealth: Payer: Self-pay | Admitting: *Deleted

## 2016-09-19 ENCOUNTER — Ambulatory Visit (INDEPENDENT_AMBULATORY_CARE_PROVIDER_SITE_OTHER): Payer: Self-pay | Admitting: Internal Medicine

## 2016-09-19 ENCOUNTER — Encounter: Payer: Self-pay | Admitting: Internal Medicine

## 2016-09-19 VITALS — BP 110/70 | HR 87 | Temp 98.3°F | Ht 72.0 in | Wt 158.0 lb

## 2016-09-19 DIAGNOSIS — I10 Essential (primary) hypertension: Secondary | ICD-10-CM

## 2016-09-19 DIAGNOSIS — R591 Generalized enlarged lymph nodes: Secondary | ICD-10-CM

## 2016-09-19 DIAGNOSIS — B20 Human immunodeficiency virus [HIV] disease: Secondary | ICD-10-CM

## 2016-09-19 NOTE — Progress Notes (Signed)
RFV: follow up for HIV disease  Patient ID: [name], male   DOB: 20-Jul-1955, 61 y.o.   MRN: [id number]  HPI  61yo M with poorly controlled hiv disease. Currently on tivicay/descovy. He reports started taking his meds 3 wks ago.doing well with adherence. He feels that l-neck nodule is getting smaller, measures 4 x 3 cm. No fevers, chills, nightsweats. He generally takes his BP meds at bedime. Outpatient Encounter Prescriptions as of 09/19/2016  Medication Sig  . amLODipine (NORVASC) 10 MG tablet Take 1 tablet (10 mg total) by mouth daily.  . dolutegravir (TIVICAY) 50 MG tablet Take 1 tablet (50 mg total) by mouth daily.  Marland Kitchen. emtricitabine-tenofovir AF (DESCOVY) 200-25 MG tablet Take 1 tablet by mouth daily.  . hydrochlorothiazide (HYDRODIURIL) 25 MG tablet Take 1 tablet (25 mg total) by mouth daily.  Marland Kitchen. lisinopril (PRINIVIL,ZESTRIL) 40 MG tablet TAKE 1 TABLET(40 MG) BY MOUTH DAILY  . valACYclovir (VALTREX) 500 MG tablet Take 1 tablet (500 mg total) by mouth daily.  Marland Kitchen. aspirin 81 MG chewable tablet Chew 1 tablet (81 mg total) by mouth daily. (Patient not taking: Reported on 09/19/2016)  . megestrol (MEGACE ES) 625 MG/5ML suspension Take 5 mLs (625 mg total) by mouth daily. (Patient not taking: Reported on 09/19/2016)  . rosuvastatin (CRESTOR) 10 MG tablet Take 1 tablet (10 mg total) by mouth at bedtime. (Patient not taking: Reported on 09/19/2016)   No facility-administered encounter medications on file as of 09/19/2016.      Patient Active Problem List   Diagnosis Date Noted  . Genital herpes 03/12/2014  . Penile wart 03/12/2014  . Urethritis, nongonococcal 03/06/2013  . HYPERLIPIDEMIA 05/02/2007  . WEIGHT LOSS 08/30/2006  . Human immunodeficiency virus (HIV) disease (HCC) 05/30/2006  . HYPERTENSION 05/30/2006  . DENTAL CARIES 05/30/2006  . METHICILLIN RESISTANT STAPHYLOCOCCUS AUREUS INFECTION 10/31/2004     Health Maintenance Due  Topic Date Due  . COLONOSCOPY  09/11/2006      Review of Systems Review of Systems  Constitutional: Negative for fever, chills, diaphoresis, activity change, appetite change, fatigue and unexpected weight change.  HENT: Negative for congestion, sore throat, rhinorrhea, sneezing, trouble swallowing and sinus pressure.  Eyes: Negative for photophobia and visual disturbance.  Respiratory: Negative for cough, chest tightness, shortness of breath, wheezing and stridor.  Cardiovascular: Negative for chest pain, palpitations and leg swelling.  Gastrointestinal: Negative for nausea, vomiting, abdominal pain, diarrhea, constipation, blood in stool, abdominal distention and anal bleeding.  Genitourinary: Negative for dysuria, hematuria, flank pain and difficulty urinating.  Musculoskeletal: Negative for myalgias, back pain, joint swelling, arthralgias and gait problem.  Skin: Negative for color change, pallor, rash and wound.  Neurological: Negative for dizziness, tremors, weakness and light-headedness.  Hematological: Negative for adenopathy. Does not bruise/bleed easily.  Psychiatric/Behavioral: Negative for behavioral problems, confusion, sleep disturbance, dysphoric mood, decreased concentration and agitation.     Physical Exam   BP 110/70   Pulse 87   Temp 98.3 F (36.8 C) (Oral)   Ht 6' (1.829 m)   Wt 158 lb (71.7 kg)   BMI 21.43 kg/m   Physical Exam  Constitutional: He is oriented to person, place, and time. He appears well-developed and well-nourished. No distress.  HENT:  Mouth/Throat: Oropharynx is clear and moist. No oropharyngeal exudate.  Cardiovascular: Normal rate, regular rhythm and normal heart sounds. Exam reveals no gallop and no friction rub.  No murmur heard.  Pulmonary/Chest: Effort normal and breath sounds normal. No respiratory distress. He has no wheezes.  Neck = left submanidubular LN at angle of jaw measuring 4 cm long by 3 cm wide - unchanged Abdominal: Soft. Bowel sounds are normal. He exhibits no  distension. There is no tenderness.  Lymphadenopathy:  He has no cervical adenopathy.  Neurological: He is alert and oriented to person, place, and time.  Skin: Skin is warm and dry. No rash noted. No erythema.  Psychiatric: He has a normal mood and affect. His behavior is normal.    Lab Results  Component Value Date   CD4TCELL 19 (L) 07/19/2016   Lab Results  Component Value Date   CD4TABS 480 07/19/2016   CD4TABS 750 07/02/2015   CD4TABS 960 03/19/2015   Lab Results  Component Value Date   HIV1RNAQUANT 179,000 (H) 07/19/2016   Lab Results  Component Value Date   HEPBSAB YES 06/12/2006   Lab Results  Component Value Date   LABRPR NON REAC 07/19/2016    CBC Lab Results  Component Value Date   WBC 7.7 08/16/2016   RBC 3.66 (L) 08/16/2016   HGB 11.4 (L) 08/16/2016   HCT 33.6 (L) 08/16/2016   PLT 323 08/16/2016   MCV 91.8 08/16/2016   MCH 31.1 08/16/2016   MCHC 33.9 08/16/2016   RDW 14.1 08/16/2016   LYMPHSABS 4,158 (H) 08/16/2016   MONOABS 1,001 (H) 08/16/2016   EOSABS 77 08/16/2016    BMET Lab Results  Component Value Date   NA 136 08/16/2016   K 4.0 08/16/2016   CL 103 08/16/2016   CO2 25 08/16/2016   GLUCOSE 100 (H) 08/16/2016   BUN 11 08/16/2016   CREATININE 1.14 08/16/2016   CALCIUM 9.0 08/16/2016   GFRNONAA 70 08/16/2016   GFRAA 81 08/16/2016      Assessment and Plan  hiv = continue to advocate taking meds daily .concern that he is not reporting everything correctly. He has only been on meds for up to 10 days per records. There appears to be miscommunication with staff and him with his pharmcy rep  htn = switch to taking htn meds in the day time  Left sided submandibular LN = appears softer. We will see him back in 2- 4 wk if it is still same size will get biopsy. Appears that it maybe responding to him being back on ART  Weight loss = stable  Cognitive impairment vs. Factious = I am concerned that he has HAND, which would fit with long  standing disease. Consider getting outreach RN to reach out to him

## 2016-09-19 NOTE — Telephone Encounter (Signed)
Left message in IR for Victorino DikeJennifer to see if patient's IR Biopsy can be scheduled. If not, will contact scheduling. Andree CossHowell, Michelle M, RN

## 2016-09-20 NOTE — Addendum Note (Signed)
Addended by: Andree CossHOWELL, MICHELLE M on: 09/20/2016 10:54 AM   Modules accepted: Orders

## 2016-09-21 ENCOUNTER — Telehealth: Payer: Self-pay | Admitting: *Deleted

## 2016-09-21 NOTE — Telephone Encounter (Signed)
Order corrected per Victorino DikeJennifer at [employer].  This goes to [facility] scheduling. RN called scheduling, left message for Helmut Musterlicia, asking to have the Ultrasound Core Biopsy scheduled for patient. RN asked her to call us or the patient at his listed numbers. RN notified patient, asked him to let me know if does not get a call from scheduling, gave him the number to [facility] assistance. Andree CossHowell, Michelle M, RN

## 2016-09-22 NOTE — Addendum Note (Signed)
Addended by: Micael HampshireHOWELL, Annalynn Centanni on: 09/22/2016 09:46 AM   Modules accepted: Orders

## 2016-09-30 ENCOUNTER — Ambulatory Visit (HOSPITAL_COMMUNITY): Payer: Self-pay

## 2016-10-04 ENCOUNTER — Ambulatory Visit (HOSPITAL_COMMUNITY)
Admission: RE | Admit: 2016-10-04 | Discharge: 2016-10-04 | Disposition: A | Discharge status: Home / Self Care | Payer: Self-pay | Source: Ambulatory Visit | Attending: Internal Medicine | Admitting: Internal Medicine

## 2016-10-04 DIAGNOSIS — K116 Mucocele of salivary gland: Secondary | ICD-10-CM | POA: Insufficient documentation

## 2016-10-04 DIAGNOSIS — R590 Localized enlarged lymph nodes: Secondary | ICD-10-CM | POA: Insufficient documentation

## 2016-10-04 DIAGNOSIS — R591 Generalized enlarged lymph nodes: Secondary | ICD-10-CM

## 2016-10-04 DIAGNOSIS — B20 Human immunodeficiency virus [HIV] disease: Secondary | ICD-10-CM | POA: Insufficient documentation

## 2016-10-08 ENCOUNTER — Other Ambulatory Visit: Payer: Self-pay | Admitting: Radiology

## 2016-10-10 ENCOUNTER — Other Ambulatory Visit: Payer: Self-pay | Admitting: Student

## 2016-10-10 ENCOUNTER — Other Ambulatory Visit: Payer: Self-pay | Admitting: General Surgery

## 2016-10-10 ENCOUNTER — Ambulatory Visit (INDEPENDENT_AMBULATORY_CARE_PROVIDER_SITE_OTHER): Payer: Self-pay | Admitting: Pharmacist

## 2016-10-10 DIAGNOSIS — E785 Hyperlipidemia, unspecified: Secondary | ICD-10-CM

## 2016-10-10 DIAGNOSIS — B20 Human immunodeficiency virus [HIV] disease: Secondary | ICD-10-CM

## 2016-10-10 DIAGNOSIS — I10 Essential (primary) hypertension: Secondary | ICD-10-CM

## 2016-10-10 MED ORDER — HYDROCHLOROTHIAZIDE 25 MG PO TABS: 25.0000 mg | ORAL_TABLET | Freq: Every day | ORAL | 5 refills | Status: DC

## 2016-10-10 MED ORDER — AMLODIPINE BESYLATE 10 MG PO TABS: 10.0000 mg | ORAL_TABLET | Freq: Every day | ORAL | 5 refills | Status: DC

## 2016-10-10 MED ORDER — ROSUVASTATIN CALCIUM 10 MG PO TABS: 10.0000 mg | ORAL_TABLET | Freq: Every day | ORAL | 5 refills | Status: DC

## 2016-10-10 MED ORDER — LISINOPRIL 40 MG PO TABS: 40.0000 mg | ORAL_TABLET | Freq: Every day | ORAL | 5 refills | Status: DC

## 2016-10-10 NOTE — Progress Notes (Signed)
HPI: [name] is a 61 y.o. male who presents to the [facility] pharmacy clinic today to follow-up for his HIV infection.   Allergies: No Known Allergies  Past Medical History: Past Medical History:  Diagnosis Date  . HIV infection (HCC)   . Hypertension     Social History: Social History   Social History  . Marital status: Single    Spouse name: N/A  . Number of children: N/A  . Years of education: N/A   Social History Main Topics  . Smoking status: Never Smoker  . Smokeless tobacco: Never Used  . Alcohol use 0.0 oz/week     Comment: occ  . Drug use: Yes    Frequency: 3.0 times per week    Types: Marijuana  . Sexual activity: Yes    Partners: Female, Male    Birth control/ protection: Condom     Comment: pt. given condoms   Other Topics Concern  . Not on file   Social History Narrative  . No narrative on file    Current Regimen: Tivicay + Descovy  Labs: HIV 1 RNA Quant (copies/mL)  Date Value  07/19/2016 179,000 (H)  07/02/2015 <20  03/19/2015 <20   CD4 T Cell Abs (/uL)  Date Value  07/19/2016 480  07/02/2015 750  03/19/2015 960   Hep B S Ab (no units)  Date Value  06/12/2006 YES   Hepatitis B Surface Ag (no units)  Date Value  06/12/2006 NO   HCV Ab (no units)  Date Value  06/12/2006 NO    CrCl: CrCl cannot be calculated (Patient's most recent lab result is older than the maximum 21 days allowed.).  Lipids:    Component Value Date/Time   CHOL 138 07/19/2016 1502   TRIG 154 (H) 07/19/2016 1502   HDL 22 (L) 07/19/2016 1502   CHOLHDL 6.3 (H) 07/19/2016 1502   VLDL 31 (H) 07/19/2016 1502   LDLCALC 85 07/19/2016 1502    Assessment: [name] is here today to follow-up with me for his HIV infection.  He finally got all of his issues figured out with [facility] and got his medications in the mail ~1 month ago. He states he has missed 1 day of medications since restarting and that is because it was so late in the night that he was nervous to  take it then turn around and take it again in the AM when he usually takes it with breakfast. He isn't having any issues, just some headaches. He said he thought the headaches were due to working outside and the pollen and heat.  He took some benadryl and sinus pressure relief medication and it resolved. He is also taking his 3 blood pressure medications. I took his BP today and it was really good at 132/86, heart rate 86. He states he took his medications this morning including his BP pills.  We will get labs again today. I refilled his BP medications for him also. I reminded him to make sure he calls [facility] when he needs refills.  He is following up tomorrow with radiology to get a biopsy on the nodule on his neck.   Plans: - Continue Tivicay 50 mg PO once daily - Continue Descovy 200-25 mg PO once daily - HIV VL today - F/u with Dr. Drue SecondSnider 7/9 at 4pm  Haidyn Chadderdon L. Amaris Garrette, PharmD, CPP Infectious Diseases Clinical Pharmacist [facility] 10/10/2016, 3:57 PM

## 2016-10-11 ENCOUNTER — Ambulatory Visit (HOSPITAL_COMMUNITY)
Admission: RE | Admit: 2016-10-11 | Discharge: 2016-10-11 | Disposition: A | Discharge status: Home / Self Care | Payer: Self-pay | Source: Ambulatory Visit | Attending: Internal Medicine | Admitting: Internal Medicine

## 2016-10-11 ENCOUNTER — Other Ambulatory Visit: Payer: Self-pay | Admitting: Internal Medicine

## 2016-10-11 ENCOUNTER — Encounter (HOSPITAL_COMMUNITY): Payer: Self-pay

## 2016-10-11 DIAGNOSIS — R591 Generalized enlarged lymph nodes: Secondary | ICD-10-CM

## 2016-10-11 DIAGNOSIS — R590 Localized enlarged lymph nodes: Secondary | ICD-10-CM | POA: Insufficient documentation

## 2016-10-11 DIAGNOSIS — Z79899 Other long term (current) drug therapy: Secondary | ICD-10-CM | POA: Insufficient documentation

## 2016-10-11 DIAGNOSIS — I10 Essential (primary) hypertension: Secondary | ICD-10-CM | POA: Insufficient documentation

## 2016-10-11 DIAGNOSIS — B20 Human immunodeficiency virus [HIV] disease: Secondary | ICD-10-CM | POA: Insufficient documentation

## 2016-10-11 DIAGNOSIS — Z7982 Long term (current) use of aspirin: Secondary | ICD-10-CM | POA: Insufficient documentation

## 2016-10-11 LAB — CBC
HEMATOCRIT: 35.8 % — AB (ref 39.0–52.0)
Hemoglobin: 12.3 g/dL — ABNORMAL LOW (ref 13.0–17.0)
MCH: 30.2 pg (ref 26.0–34.0)
MCHC: 34.4 g/dL (ref 30.0–36.0)
MCV: 88 fL (ref 78.0–100.0)
Platelets: 328 10*3/uL (ref 150–400)
RBC: 4.07 MIL/uL — ABNORMAL LOW (ref 4.22–5.81)
RDW: 13.4 % (ref 11.5–15.5)
WBC: 6.9 10*3/uL (ref 4.0–10.5)

## 2016-10-11 LAB — PROTIME-INR
INR: 0.96
Prothrombin Time: 12.7 seconds (ref 11.4–15.2)

## 2016-10-11 LAB — APTT: aPTT: 29 seconds (ref 24–36)

## 2016-10-11 MED ORDER — FENTANYL CITRATE (PF) 100 MCG/2ML IJ SOLN
INTRAMUSCULAR | Status: AC
  Filled 2016-10-11: qty 2

## 2016-10-11 MED ORDER — SODIUM CHLORIDE 0.9 % IV SOLN
INTRAVENOUS | Status: DC
  Administered 2016-10-11: 12:00:00 via INTRAVENOUS

## 2016-10-11 MED ORDER — MIDAZOLAM HCL 2 MG/2ML IJ SOLN
INTRAMUSCULAR | Status: AC
  Filled 2016-10-11: qty 2

## 2016-10-11 MED ORDER — LIDOCAINE HCL (PF) 1 % IJ SOLN
INTRAMUSCULAR | Status: AC
  Filled 2016-10-11: qty 30

## 2016-10-11 NOTE — Sedation Documentation (Signed)
Awaiting lab work completion and MD.  Pt aware of events and agreeable, questions answered.  Pt does not believe he will need sedation today.

## 2016-10-11 NOTE — H&P (Signed)
Chief Complaint: Patient was seen in consultation today for left neck lymph node biopsy at the request of Snider,Cynthia  Referring Physician(s): Snider,Cynthia  Supervising Physician: Ruel Favors  Patient Status: [facility] - Out-pt  History of Present Illness: [name] is a 61 y.o. male   Pt states he has had this noticeable left neck lymph node for 4 yrs. He says the LN comes and goes Dr Drue Second follows pt for HIV And is requesting biopsy at this point.  6/19: IMPRESSION: 1. Bilateral mildly prominent cervical lymphadenopathy likely represents HIV associated lymphadenopathy, or reactive lymphadenopathy. None of the lymph nodes are technically enlarged by ultrasound criteria. 2. Moderately complex cystic lesion within the left parotid gland. 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 Additional benign and malignant parotid neoplasms are also within the differential. If further imaging confirmation is warranted, MRI with and without gadolinium contrast could further evaluate.  Scheduled now for L neck LAN biopsy   Past Medical History:  Diagnosis Date  . HIV infection (HCC)   . Hypertension     History reviewed. No pertinent surgical history.  Allergies: Patient has no known allergies.  Medications: Prior to Admission medications   Medication Sig Start Date End Date Taking? Authorizing Provider  amLODipine (NORVASC) 10 MG tablet Take 1 tablet (10 mg total) by mouth daily. 10/10/16  Yes Kuppelweiser, Cassie L, RPH  dolutegravir (TIVICAY) 50 MG tablet Take 1 tablet (50 mg total) by mouth daily. 08/16/16  Yes Judyann Munson, MD  emtricitabine-tenofovir AF (DESCOVY) 200-25 MG tablet Take 1 tablet by mouth daily. 08/16/16  Yes Judyann Munson, MD  hydrochlorothiazide (HYDRODIURIL) 25 MG tablet Take 1 tablet (25 mg total) by mouth daily. 10/10/16  Yes Kuppelweiser, Cassie L, RPH  lisinopril (PRINIVIL,ZESTRIL) 40 MG tablet Take  1 tablet (40 mg total) by mouth daily. 10/10/16  Yes Kuppelweiser, Cassie L, RPH  valACYclovir (VALTREX) 500 MG tablet Take 1 tablet (500 mg total) by mouth daily. 08/11/15  Yes Judyann Munson, MD  aspirin 81 MG chewable tablet Chew 1 tablet (81 mg total) by mouth daily. Patient not taking: Reported on 09/19/2016 08/11/15   Judyann Munson, MD  megestrol (MEGACE ES) 625 MG/5ML suspension Take 5 mLs (625 mg total) by mouth daily. Patient not taking: Reported on 09/19/2016 08/11/15   Judyann Munson, MD  rosuvastatin (CRESTOR) 10 MG tablet Take 1 tablet (10 mg total) by mouth at bedtime. Patient not taking: Reported on 10/11/2016 10/10/16   Robinette Haines, RPH     History reviewed. No pertinent family history.  Social History   Social History  . Marital status: Single    Spouse name: N/A  . Number of children: N/A  . Years of education: N/A   Social History Main Topics  . Smoking status: Never Smoker  . Smokeless tobacco: Never Used  . Alcohol use 0.0 oz/week     Comment: occ  . Drug use: Yes    Frequency: 3.0 times per week    Types: Marijuana  . Sexual activity: Yes    Partners: Female, Male    Birth control/ protection: Condom     Comment: pt. given condoms   Other Topics Concern  . None   Social History Narrative  . None    Review of Systems: A 12 point ROS discussed and pertinent positives are indicated in the HPI above.  All other systems are negative.  Review of Systems  Constitutional: Negative for activity change, appetite  change, fatigue and fever.  Respiratory: Negative for shortness of breath.   Gastrointestinal: Negative for abdominal pain.  Neurological: Negative for weakness.  Hematological: Positive for adenopathy.  Psychiatric/Behavioral: Negative for behavioral problems and confusion.    Vital Signs: There were no vitals taken for this visit.  Physical Exam  Constitutional: He is oriented to person, place, and time.  Cardiovascular: Normal rate,  regular rhythm and normal heart sounds.   Pulmonary/Chest: Effort normal and breath sounds normal.  Abdominal: Soft. Bowel sounds are normal.  Musculoskeletal: Normal range of motion.  Neurological: He is alert and oriented to person, place, and time.  Skin: Skin is warm and dry.  Psychiatric: He has a normal mood and affect. His behavior is normal. Judgment and thought content normal.  Nursing note and vitals reviewed.   Mallampati Score:  MD Evaluation Airway: WNL Heart: WNL Abdomen: WNL Chest/ Lungs: WNL ASA  Classification: 3 Mallampati/Airway Score: One  Imaging: [place] Soft Tissue Head And Neck  Result Date: 10/04/2016 CLINICAL DATA:  61 year old male with HIV and cervical lymphadenopathy EXAM: ULTRASOUND OF HEAD/NECK SOFT TISSUES TECHNIQUE: Ultrasound examination of the head and neck soft tissues was performed in the area of clinical concern. COMPARISON:  None FINDINGS: A sonographic interrogation of the region of clinical concern demonstrates multiple hypoechoic lymph nodes with fatty hila. The largest right cervical node measures up to 0.7 cm in diameter. Imaging of the left parotid gland demonstrates hypoechoic masses with minimal internal debris measuring up to 3.0 x 1.3 x 2.0 cm. Sonographic interrogation of the region of clinical concern in the left neck demonstrates multiple mildly prominent lymph nodes with maintain echogenic fatty hila. The largest lymph node measures up to 0.5 cm in short axis. IMPRESSION: 1. Bilateral mildly prominent cervical lymphadenopathy likely represents HIV associated lymphadenopathy, or reactive lymphadenopathy. None of the lymph nodes are technically enlarged by ultrasound criteria. 2. Moderately complex cystic lesion within the left parotid gland. 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 Additional benign and malignant parotid neoplasms are also within the differential. If further imaging confirmation is  warranted, MRI with and without gadolinium contrast could further evaluate. Electronically Signed   By: Malachy MoanHeath  McCullough M.D.   On: 10/04/2016 15:53    Labs:  CBC:  Recent Labs  07/19/16 1502 08/16/16 1634  WBC 4.7 7.7  HGB 11.3* 11.4*  HCT 33.4* 33.6*  PLT 265 323    COAGS: No results for input(s): INR, APTT in the last 8760 hours.  BMP:  Recent Labs  07/19/16 1502 08/16/16 1634  NA 136 136  K 4.0 4.0  CL 105 103  CO2 22 25  GLUCOSE 90 100*  BUN 8 11  CALCIUM 8.6 9.0  CREATININE 1.11 1.14  GFRNONAA  --  70  GFRAA  --  81    LIVER FUNCTION TESTS:  Recent Labs  07/19/16 1502 08/16/16 1634  BILITOT 0.2 0.3  AST 19 27  ALT 12 26  ALKPHOS 58 71  PROT 6.9 8.2*  ALBUMIN 3.1* 3.5*    TUMOR MARKERS: No results for input(s): AFPTM, CEA, CA199, CHROMGRNA in the last 8760 hours.  Assessment and Plan:  + HIV Left Neck Lymphadenopathy Now scheduled for biopsy of same Risks and Benefits discussed with the patient including, but not limited to bleeding, infection, damage to adjacent structures or low yield requiring additional tests. All of the patient's questions were answered, patient is agreeable to proceed. Consent signed and in chart.  Thank you for this interesting consult.  I greatly enjoyed meeting [name] and look forward to participating in their care.  A copy of this report was sent to the requesting provider on this date.  Electronically Signed: Robet Leu, PA-C 10/11/2016, 12:14 PM   I spent a total of  30 Minutes   in face to face in clinical consultation, greater than 50% of which was counseling/coordinating care for left neck LAN bx

## 2016-10-11 NOTE — Procedures (Signed)
Left paroitd lymphepithelial cyst  S/P LEFT PAROTID CYST NEEDLE ASPIRATION  5CC BROWN DEBRIS FILLED FLD ASPIRATED  No comp Stable afb cx sent  Full report in PACS

## 2016-10-11 NOTE — Sedation Documentation (Signed)
No sedation required per Dr

## 2016-10-11 NOTE — Sedation Documentation (Addendum)
Wound care instructions reviewed, fiance is an [employer] fund[employer].  D/C to home, ambulatory.  Tolerated well.  Left neck bandaid CDI. Denies pain

## 2016-10-11 NOTE — Sedation Documentation (Signed)
Dr Miles CostainShick in to see pt, page placed to Dr Ilsa IhaSnyder to clarify biopsy order.

## 2016-10-11 NOTE — Sedation Documentation (Signed)
Moved to [place]

## 2016-10-12 LAB — ACID FAST SMEAR (AFB): ACID FAST SMEAR - AFSCU2: NEGATIVE

## 2016-10-12 LAB — ACID FAST SMEAR (AFB, MYCOBACTERIA)

## 2016-10-12 LAB — HIV-1 RNA QUANT-NO REFLEX-BLD
HIV 1 RNA Quant: 60 copies/mL — ABNORMAL HIGH
HIV-1 RNA Quant, Log: 1.78 Log copies/mL — ABNORMAL HIGH

## 2016-10-24 ENCOUNTER — Ambulatory Visit (INDEPENDENT_AMBULATORY_CARE_PROVIDER_SITE_OTHER): Payer: Self-pay | Admitting: Internal Medicine

## 2016-10-24 ENCOUNTER — Encounter: Payer: Self-pay | Admitting: Internal Medicine

## 2016-10-24 VITALS — BP 161/101 | HR 89 | Temp 98.4°F | Ht 72.0 in | Wt 160.0 lb

## 2016-10-24 DIAGNOSIS — L509 Urticaria, unspecified: Secondary | ICD-10-CM

## 2016-10-24 DIAGNOSIS — R591 Generalized enlarged lymph nodes: Secondary | ICD-10-CM

## 2016-10-24 DIAGNOSIS — I10 Essential (primary) hypertension: Secondary | ICD-10-CM

## 2016-10-24 DIAGNOSIS — Z23 Encounter for immunization: Secondary | ICD-10-CM

## 2016-10-24 DIAGNOSIS — B20 Human immunodeficiency virus [HIV] disease: Secondary | ICD-10-CM

## 2016-10-24 MED ORDER — HYDROCORTISONE 1 % EX CREA: 1.0000 "application " | TOPICAL_CREAM | Freq: Two times a day (BID) | CUTANEOUS | 0 refills | Status: DC

## 2016-10-24 NOTE — Progress Notes (Signed)
Patient ID: [name], male   DOB: 08/08/1955, 61 y.o.   MRN: [id number]  HPI [name] is a 61yo M with hiv disease, HTN, who was off of ART for the majority of 2017 but now getting back into care. He had symptoms suggestive of uncontrolled hiv disease at last visit with fevers, nightsweats, and increased LN. On exam he had marked LN enlargement submandibular/ right parotid gland. When he restarted his HIV medication, some of the nodes decreased in size. We arranged for one of the enlarged areas to be aspirated. afb cx at 2 wk if NGTD.  Yesterday he started to notice marked hives/Welts on his arms from yesterday. Has been taking benadryl since last night  Outpatient Encounter Prescriptions as of 10/24/2016  Medication Sig  . amLODipine (NORVASC) 10 MG tablet Take 1 tablet (10 mg total) by mouth daily.  . dolutegravir (TIVICAY) 50 MG tablet Take 1 tablet (50 mg total) by mouth daily.  Marland Kitchen emtricitabine-tenofovir AF (DESCOVY) 200-25 MG tablet Take 1 tablet by mouth daily.  . hydrochlorothiazide (HYDRODIURIL) 25 MG tablet Take 1 tablet (25 mg total) by mouth daily.  Marland Kitchen lisinopril (PRINIVIL,ZESTRIL) 40 MG tablet Take 1 tablet (40 mg total) by mouth daily.  . valACYclovir (VALTREX) 500 MG tablet Take 1 tablet (500 mg total) by mouth daily.   No facility-administered encounter medications on file as of 10/24/2016.      Patient Active Problem List   Diagnosis Date Noted  . Genital herpes 03/12/2014  . Penile wart 03/12/2014  . Urethritis, nongonococcal 03/06/2013  . HYPERLIPIDEMIA 05/02/2007  . WEIGHT LOSS 08/30/2006  . Human immunodeficiency virus (HIV) disease ([place]) 05/30/2006  . HYPERTENSION 05/30/2006  . DENTAL CARIES 05/30/2006  . METHICILLIN RESISTANT STAPHYLOCOCCUS AUREUS INFECTION 10/31/2004     Health Maintenance Due  Topic Date Due  . COLONOSCOPY  09/11/2006     Review of Systems + LN enlarged and "welts to his arms" otherwise 12 point ros is negative Physical Exam    BP (!) 161/101   Pulse 89   Temp 98.4 F (36.9 C) (Oral)   Ht 6' (1.829 m)   Wt 160 lb (72.6 kg)   BMI 21.70 kg/m   Physical Exam  Constitutional: He is oriented to person, place, and time. He appears well-developed and well-nourished. No distress.  HENT:  Mouth/Throat: Oropharynx is clear and moist. No oropharyngeal exudate.  Cardiovascular: Normal rate, regular rhythm and normal heart sounds. Exam reveals no gallop and no friction rub.  No murmur heard.  Pulmonary/Chest: Effort normal and breath sounds normal. No respiratory distress. He has no wheezes.  Abdominal: Soft. Bowel sounds are normal. He exhibits no distension. There is no tenderness.  Lymphadenopathy:  He has no cervical adenopathy.  Neurological: He is alert and oriented to person, place, and time.  Skin: Skin is warm and dry. Scattered hives to arms -- seize of a quarter + Psychiatric: He has a normal mood and affect. His behavior is normal.    Lab Results  Component Value Date   CD4TCELL 19 (L) 07/19/2016   Lab Results  Component Value Date   CD4TABS 480 07/19/2016   CD4TABS 750 07/02/2015   CD4TABS 960 03/19/2015   Lab Results  Component Value Date   HIV1RNAQUANT 60 (H) 10/10/2016   Lab Results  Component Value Date   HEPBSAB YES 06/12/2006   Lab Results  Component Value Date   LABRPR NON REAC 07/19/2016    CBC Lab Results  Component Value  Date   WBC 6.9 10/11/2016   RBC 4.07 (L) 10/11/2016   HGB 12.3 (L) 10/11/2016   HCT 35.8 (L) 10/11/2016   PLT 328 10/11/2016   MCV 88.0 10/11/2016   MCH 30.2 10/11/2016   MCHC 34.4 10/11/2016   RDW 13.4 10/11/2016   LYMPHSABS 4,158 (H) 08/16/2016   MONOABS 1,001 (H) 08/16/2016   EOSABS 77 08/16/2016    BMET Lab Results  Component Value Date   NA 136 08/16/2016   K 4.0 08/16/2016   CL 103 08/16/2016   CO2 25 08/16/2016   GLUCOSE 100 (H) 08/16/2016   BUN 11 08/16/2016   CREATININE 1.14 08/16/2016   CALCIUM 9.0 08/16/2016   GFRNONAA 70  08/16/2016   GFRAA 81 08/16/2016     Assessment and Plan  HIV disease = I reviewed his labs prior to this visit and he is nearly undetectable with VL<100. Continue on current regimen  LN enlargement = I have reviewed micro results that did not show any AFB which were were concerned for MAC. His symptoms are improved. Path was not sent since it appeared benign per radiology.   htn = likely elevated due to recent benadryl use. Will ask him to take his BP meds in the morning nad repeat reading at grocery store. If still elevated at next visit, will need to adjust meds  Hives = recommend zantac and benadryl cream

## 2016-10-24 NOTE — Patient Instructions (Signed)
Come back 2 weeks before next visit for lab work

## 2016-10-26 LAB — T-HELPER CELL (CD4) - (RCID CLINIC ONLY)
CD4 % Helper T Cell: 21 % — ABNORMAL LOW (ref 33–55)
CD4 T CELL ABS: 930 /uL (ref 400–2700)

## 2016-11-18 ENCOUNTER — Telehealth: Payer: Self-pay | Admitting: *Deleted

## 2016-11-18 NOTE — Telephone Encounter (Signed)
Patient called to advise that he has been having trouble swallowing lately. He advised he has not had a fever or cough but has trouble swallowing and it hurts. It also feels uncomfortable when his mouth is closed like his tonsils are too big. He has tried theraflu and other OTC medication for sinus but does not feel better. Advised he would like to be seen if possible. Gave him an appointment with Rexene AlbertsStephanie Dixon for Monday 11/21/16 at 315 pm. Advised him if he starts to choke of have trouble breathing he should call 911.

## 2016-11-21 ENCOUNTER — Ambulatory Visit: Payer: Self-pay | Admitting: Infectious Diseases

## 2016-11-25 LAB — ACID FAST CULTURE WITH REFLEXED SENSITIVITIES (MYCOBACTERIA): Acid Fast Culture: NEGATIVE

## 2016-11-25 LAB — ACID FAST CULTURE WITH REFLEXED SENSITIVITIES

## 2016-12-16 ENCOUNTER — Ambulatory Visit: Payer: Self-pay

## 2016-12-21 ENCOUNTER — Encounter: Payer: Self-pay | Admitting: Internal Medicine

## 2017-01-12 ENCOUNTER — Encounter: Payer: Self-pay | Admitting: Infectious Diseases

## 2017-01-12 ENCOUNTER — Ambulatory Visit (INDEPENDENT_AMBULATORY_CARE_PROVIDER_SITE_OTHER): Payer: Self-pay | Admitting: Infectious Diseases

## 2017-01-12 VITALS — BP 149/95 | HR 90 | Temp 98.4°F | Wt 153.0 lb

## 2017-01-12 DIAGNOSIS — B20 Human immunodeficiency virus [HIV] disease: Secondary | ICD-10-CM

## 2017-01-12 DIAGNOSIS — R197 Diarrhea, unspecified: Secondary | ICD-10-CM | POA: Insufficient documentation

## 2017-01-12 LAB — BASIC METABOLIC PANEL
BUN/Creatinine Ratio: 9 (calc) (ref 6–22)
BUN: 13 mg/dL (ref 7–25)
CALCIUM: 9.6 mg/dL (ref 8.6–10.3)
CO2: 24 mmol/L (ref 20–32)
Chloride: 100 mmol/L (ref 98–110)
Creat: 1.5 mg/dL — ABNORMAL HIGH (ref 0.70–1.25)
GLUCOSE: 106 mg/dL — AB (ref 65–99)
POTASSIUM: 4 mmol/L (ref 3.5–5.3)
SODIUM: 136 mmol/L (ref 135–146)

## 2017-01-12 LAB — CBC
HEMATOCRIT: 38.4 % — AB (ref 38.5–50.0)
HEMOGLOBIN: 13.1 g/dL — AB (ref 13.2–17.1)
MCH: 30 pg (ref 27.0–33.0)
MCHC: 34.1 g/dL (ref 32.0–36.0)
MCV: 87.9 fL (ref 80.0–100.0)
MPV: 9.2 fL (ref 7.5–12.5)
Platelets: 385 10*3/uL (ref 140–400)
RBC: 4.37 10*6/uL (ref 4.20–5.80)
RDW: 13.1 % (ref 11.0–15.0)
WBC: 10 10*3/uL (ref 3.8–10.8)

## 2017-01-12 NOTE — Patient Instructions (Addendum)
We will check your blood work today and look at your stool for any signs of infection.   We may need to do a CT scan of your abdomen if you do not improve and your stool does not show or symptoms get worse.   Continue resting your bowels and making sure you get plenty of fluids. Would avoid dairy until you feel better.    Bland Diet A bland diet consists of foods that do not have a lot of fat or fiber. Foods without fat or fiber are easier for the body to digest. They are also less likely to irritate your mouth, throat, stomach, and other parts of your gastrointestinal tract. A bland diet is sometimes called a BRAT diet. What is my plan? Your health care provider or dietitian may recommend specific changes to your diet to prevent and treat your symptoms, such as:  Eating small meals often.  Cooking food until it is soft enough to chew easily.  Chewing your food well.  Drinking fluids slowly.  Not eating foods that are very spicy, sour, or fatty.  Not eating citrus fruits, such as oranges and grapefruit.  What do I need to know about this diet?  Eat a variety of foods from the bland diet food list.  Do not follow a bland diet longer than you have to.  Ask your health care provider whether you should take vitamins. What foods can I eat? Grains  Hot cereals, such as cream of wheat. Bread, crackers, or tortillas made from refined white flour. Rice. Vegetables Canned or cooked vegetables. Mashed or boiled potatoes. Fruits Bananas. Applesauce. Other types of cooked or canned fruit with the skin and seeds removed, such as canned peaches or pears. Meats and Other Protein Sources Scrambled eggs. Creamy peanut butter or other nut butters. Lean, well-cooked meats, such as chicken or fish. Tofu. Soups or broths. Dairy Low-fat dairy products, such as milk, cottage cheese, or yogurt. Beverages Water. Herbal tea. Apple juice. Sweets and Desserts Pudding. Custard. Fruit gelatin. Ice  cream. Fats and Oils Mild salad dressings. Canola or olive oil. The items listed above may not be a complete list of allowed foods or beverages. Contact your dietitian for more options. What foods are not recommended? Foods and ingredients that are often not recommended include:  Spicy foods, such as hot sauce or salsa.  Fried foods.  Sour foods, such as pickled or fermented foods.  Raw vegetables or fruits, especially citrus or berries.  Caffeinated drinks.  Alcohol.  Strongly flavored seasonings or condiments.  The items listed above may not be a complete list of foods and beverages that are not allowed. Contact your dietitian for more information. This information is not intended to replace advice given to you by your health care provider. Make sure you discuss any questions you have with your health care provider. Document Released: 07/27/2015 Document Revised: 09/10/2015 Document Reviewed: 04/16/2014 Elsevier Interactive Patient Education  2018 [employer].

## 2017-01-12 NOTE — Assessment & Plan Note (Addendum)
Gastroenteritis d/t food borne pathogen (salmonella?) vs cdiff vs diverticulitis. Tender over LLQ. Has never had colonoscopy per his report. Check stool culture, CDiff, cbc, cmet today. Recommended bland diet, avoidance of dairy, and increased fluid intake. No need for antinausea medication today.   May need CT scan should infectious work up prove unrevealing and continued pain.

## 2017-01-12 NOTE — Progress Notes (Signed)
Patient ID: [name], male   DOB: Jun 25, 1955, 61 y.o.   MRN: [id number]  HPI: [name] is a 61yo M patient of Dr. Feliz Beam with HIV disease, HTN, who was off of ART for the majority of 2017 but has been back on track recently. He had symptoms suggestive of uncontrolled hiv disease at last visit with fevers, nightsweats, and increased LN. At that time biopsy of LN and blood was neg for AFB. Last viral load in June was nearly undetectable at 60 copies.   He is here today for evaluation of abdominal pain, diarrhea and subjective fevers. Ate at [employer] Thursday evening and by Friday morning he started feeling poorly - diffuse cramping/tight feeling in stomach and increased gas. Saturday and Sunday the pain got so bad he could not go to church. BMs are mostly watery and range from dark brown with some occasional streaks/spots of blood. No measured fevers bur reports cyclical chills/sweating daily. Has tried to modify diet to help with symptoms. No associated nausea or vomiting. Ate alone so no unsure if anyone else became ill.   Has been compliant with his HIV medications.    Outpatient Encounter Prescriptions as of 01/12/2017  Medication Sig  . amLODipine (NORVASC) 10 MG tablet Take 1 tablet (10 mg total) by mouth daily.  . dolutegravir (TIVICAY) 50 MG tablet Take 1 tablet (50 mg total) by mouth daily.  Marland Kitchen emtricitabine-tenofovir AF (DESCOVY) 200-25 MG tablet Take 1 tablet by mouth daily.  . hydrochlorothiazide (HYDRODIURIL) 25 MG tablet Take 1 tablet (25 mg total) by mouth daily.  . hydrocortisone cream 1 % Apply 1 application topically 2 (two) times daily.  Marland Kitchen lisinopril (PRINIVIL,ZESTRIL) 40 MG tablet Take 1 tablet (40 mg total) by mouth daily.  . valACYclovir (VALTREX) 500 MG tablet Take 1 tablet (500 mg total) by mouth daily.   No facility-administered encounter medications on file as of 01/12/2017.     Patient Active Problem List   Diagnosis Date Noted  . Diarrhea 01/12/2017  .  Genital herpes 03/12/2014  . Penile wart 03/12/2014  . Urethritis, nongonococcal 03/06/2013  . HYPERLIPIDEMIA 05/02/2007  . WEIGHT LOSS 08/30/2006  . Human immunodeficiency virus (HIV) disease ([facility]) 05/30/2006  . HYPERTENSION 05/30/2006  . DENTAL CARIES 05/30/2006  . METHICILLIN RESISTANT STAPHYLOCOCCUS AUREUS INFECTION 10/31/2004     Health Maintenance Due  Topic Date Due  . COLONOSCOPY  09/11/2006  . INFLUENZA VACCINE  11/16/2016     Review of Systems Review of Systems  Constitutional: Positive for chills and malaise/fatigue.  Respiratory: Negative.   Cardiovascular: Negative.   Gastrointestinal: Positive for abdominal pain, blood in stool and diarrhea. Negative for nausea and vomiting.  Genitourinary: Negative.   Musculoskeletal: Negative for joint pain and myalgias.  Skin: Negative for rash.     BP (!) 149/95   Pulse 90   Temp 98.4 F (36.9 C) (Oral)   Wt 153 lb (69.4 kg)   BMI 20.75 kg/m     Physical Exam  Physical Exam  Constitutional:  Does not appear acutely ill or dehydrated.   HENT:  Mouth/Throat: No oropharyngeal exudate.  Eyes: No scleral icterus.  Cardiovascular: Normal rate and regular rhythm.   Pulmonary/Chest: Effort normal and breath sounds normal.  Abdominal: Soft. He exhibits distension and mass. Bowel sounds are increased. There is generalized tenderness.  Marked tenderness over LLQ/LUQ with palpation   Lymphadenopathy:    He has no cervical adenopathy.  Skin: Skin is warm and dry.  Lab Results  Component Value Date   CD4TCELL 23 (L) 01/12/2017   Lab Results  Component Value Date   CD4TABS 720 01/12/2017   CD4TABS 930 10/24/2016   CD4TABS 480 07/19/2016   Lab Results  Component Value Date   HIV1RNAQUANT <20 NOT DETECTED 01/12/2017   Lab Results  Component Value Date   HEPBSAB YES 06/12/2006   Lab Results  Component Value Date   LABRPR NON REAC 07/19/2016    CBC Lab Results  Component Value Date   WBC 10.0  01/12/2017   RBC 4.37 01/12/2017   HGB 13.1 (L) 01/12/2017   HCT 38.4 (L) 01/12/2017   PLT 385 01/12/2017   MCV 87.9 01/12/2017   MCH 30.0 01/12/2017   MCHC 34.1 01/12/2017   RDW 13.1 01/12/2017   LYMPHSABS 4,158 (H) 08/16/2016   MONOABS 1,001 (H) 08/16/2016   EOSABS 77 08/16/2016    BMET Lab Results  Component Value Date   NA 136 01/12/2017   K 4.0 01/12/2017   CL 100 01/12/2017   CO2 24 01/12/2017   GLUCOSE 106 (H) 01/12/2017   BUN 13 01/12/2017   CREATININE 1.50 (H) 01/12/2017   CALCIUM 9.6 01/12/2017   GFRNONAA 70 08/16/2016   GFRAA 81 08/16/2016     Assessment and Plan  Problem List Items Addressed This Visit      Other   Diarrhea    Gastroenteritis d/t food borne pathogen (salmonella?) vs cdiff vs diverticulitis. Tender over LLQ. Has never had colonoscopy per his report. Check stool culture, CDiff, cbc, cmet today. Recommended bland diet, avoidance of dairy, and increased fluid intake. No need for antinausea medication today.   May need CT scan should infectious work up prove unrevealing and continued pain.       Relevant Orders   Stool Culture (Completed)   CBC (Completed)   HIV 1 RNA quant-no reflex-bld (Completed)   T-helper cell (CD4)- ([facility] only) (Completed)   Basic metabolic panel (Completed)   Clostridium difficile culture-fecal    Other Visit Diagnoses    HIV (human immunodeficiency virus infection) ([facility])    -  Primary   Relevant Orders   HIV 1 RNA quant-no reflex-bld (Completed)   T-helper cell (CD4)- ([facility] clinic only) (Completed)     Rexene Alberts, MSN, NP-C [facility] Pager: [phone]  01/12/2017

## 2017-01-12 NOTE — Assessment & Plan Note (Signed)
Will reassess VL/CD4 today to ensure continued compliance on ART with recent lapse of care.

## 2017-01-13 LAB — T-HELPER CELL (CD4) - (RCID CLINIC ONLY)
CD4 % Helper T Cell: 23 % — ABNORMAL LOW (ref 33–55)
CD4 T CELL ABS: 720 /uL (ref 400–2700)

## 2017-01-14 LAB — HIV-1 RNA QUANT-NO REFLEX-BLD
HIV 1 RNA QUANT: NOT DETECTED {copies}/mL
HIV-1 RNA QUANT, LOG: NOT DETECTED {Log_copies}/mL

## 2017-01-16 LAB — STOOL CULTURE
MICRO NUMBER: 81073969
MICRO NUMBER:: 81073967
MICRO NUMBER:: 81073968
SHIGA RESULT:: NOT DETECTED
SPECIMEN QUALITY:: ADEQUATE
SPECIMEN QUALITY:: ADEQUATE
SPECIMEN QUALITY:: ADEQUATE

## 2017-01-16 LAB — TIQ-NTM

## 2017-01-17 LAB — CLOSTRIDIUM DIFFICILE CULTURE-FECAL

## 2017-01-24 ENCOUNTER — Other Ambulatory Visit: Payer: Self-pay | Admitting: *Deleted

## 2017-01-24 ENCOUNTER — Ambulatory Visit (INDEPENDENT_AMBULATORY_CARE_PROVIDER_SITE_OTHER): Payer: Self-pay | Admitting: Internal Medicine

## 2017-01-24 ENCOUNTER — Encounter: Payer: Self-pay | Admitting: Internal Medicine

## 2017-01-24 DIAGNOSIS — Z23 Encounter for immunization: Secondary | ICD-10-CM

## 2017-01-24 MED ORDER — AMOXICILLIN-POT CLAVULANATE 875-125 MG PO TABS: 1.0000 | ORAL_TABLET | Freq: Two times a day (BID) | ORAL | 0 refills | Status: DC

## 2017-01-24 MED ORDER — DOXYCYCLINE HYCLATE 100 MG PO TABS: 100.0000 mg | ORAL_TABLET | Freq: Two times a day (BID) | ORAL | 0 refills | Status: DC

## 2017-01-24 MED ORDER — MEGESTROL ACETATE 400 MG/10ML PO SUSP: 400.0000 mg | Freq: Every day | ORAL | 3 refills | Status: DC

## 2017-01-24 NOTE — Progress Notes (Signed)
RFV: follow up for hiv disease Patient ID: [name], male   DOB: Oct 15, 1955, 61 y.o.   MRN: [id number]  HPI [name] is a 61yo M with hiv disease, htn. Cd 4 count of 720/VL<20 on tivicay/descovy. Was seen last week after having gastroenteritis from eating at [employer] and also found other sick contacts. Since then he has noticed some irritation to his buttock near anus on left side. Initially did warm sitz bath which irritated region. Now doing ice pack. Improved in the sensation but still tender to touch  Outpatient Encounter Prescriptions as of 01/24/2017  Medication Sig  . amLODipine (NORVASC) 10 MG tablet Take 1 tablet (10 mg total) by mouth daily.  . dolutegravir (TIVICAY) 50 MG tablet Take 1 tablet (50 mg total) by mouth daily.  Marland Kitchen emtricitabine-tenofovir AF (DESCOVY) 200-25 MG tablet Take 1 tablet by mouth daily.  . hydrochlorothiazide (HYDRODIURIL) 25 MG tablet Take 1 tablet (25 mg total) by mouth daily.  . hydrocortisone cream 1 % Apply 1 application topically 2 (two) times daily.  Marland Kitchen lisinopril (PRINIVIL,ZESTRIL) 40 MG tablet Take 1 tablet (40 mg total) by mouth daily.  . valACYclovir (VALTREX) 500 MG tablet Take 1 tablet (500 mg total) by mouth daily.   No facility-administered encounter medications on file as of 01/24/2017.      Patient Active Problem List   Diagnosis Date Noted  . Diarrhea 01/12/2017  . Genital herpes 03/12/2014  . Penile wart 03/12/2014  . Urethritis, nongonococcal 03/06/2013  . HYPERLIPIDEMIA 05/02/2007  . WEIGHT LOSS 08/30/2006  . Human immunodeficiency virus (HIV) disease ([facility]) 05/30/2006  . HYPERTENSION 05/30/2006  . DENTAL CARIES 05/30/2006  . METHICILLIN RESISTANT STAPHYLOCOCCUS AUREUS INFECTION 10/31/2004     Health Maintenance Due  Topic Date Due  . COLONOSCOPY  09/11/2006  . INFLUENZA VACCINE  11/16/2016     Review of Systems + tender with defication Physical Exam   BP (!) 164/91   Pulse 94   Temp 98.1 F (36.7 C) (Oral)    Ht 6' (1.829 m)   Wt 153 lb (69.4 kg)   BMI 20.75 kg/m   Tender at 7 oclock of anus no erythema or pulsatile Lab Results  Component Value Date   CD4TCELL 23 (L) 01/12/2017   Lab Results  Component Value Date   CD4TABS 720 01/12/2017   CD4TABS 930 10/24/2016   CD4TABS 480 07/19/2016   Lab Results  Component Value Date   HIV1RNAQUANT <20 NOT DETECTED 01/12/2017   Lab Results  Component Value Date   HEPBSAB YES 06/12/2006   Lab Results  Component Value Date   LABRPR NON REAC 07/19/2016    CBC Lab Results  Component Value Date   WBC 10.0 01/12/2017   RBC 4.37 01/12/2017   HGB 13.1 (L) 01/12/2017   HCT 38.4 (L) 01/12/2017   PLT 385 01/12/2017   MCV 87.9 01/12/2017   MCH 30.0 01/12/2017   MCHC 34.1 01/12/2017   RDW 13.1 01/12/2017   LYMPHSABS 4,158 (H) 08/16/2016   MONOABS 1,001 (H) 08/16/2016   EOSABS 77 08/16/2016    BMET Lab Results  Component Value Date   NA 136 01/12/2017   K 4.0 01/12/2017   CL 100 01/12/2017   CO2 24 01/12/2017   GLUCOSE 106 (H) 01/12/2017   BUN 13 01/12/2017   CREATININE 1.50 (H) 01/12/2017   CALCIUM 9.6 01/12/2017   GFRNONAA 70 08/16/2016   GFRAA 81 08/16/2016      Assessment and Plan  Possible anal abscess=  no fluctuation. Will start on doxy plus amox/clav. Will get pelvic CT to see if needs I x D.if worsens to go to ED for evaluation  hiv = well controlled, continue on current regimen  Health maintenance = received flu shot this visit  htn = poorly controlled likely due to pain. Address at next visit  ckd = stable  Weight loss = from gastroenteritis, and hiv disease. Would like to restart back on megace

## 2017-01-26 ENCOUNTER — Encounter (HOSPITAL_COMMUNITY): Payer: Self-pay | Admitting: Emergency Medicine

## 2017-01-26 ENCOUNTER — Emergency Department (HOSPITAL_COMMUNITY): Payer: Self-pay | Admitting: Certified Registered Nurse Anesthetist

## 2017-01-26 ENCOUNTER — Encounter (HOSPITAL_COMMUNITY)
Admission: EM | Disposition: A | Discharge status: Home Health | Payer: Self-pay | Source: Home / Self Care | Attending: Internal Medicine

## 2017-01-26 ENCOUNTER — Inpatient Hospital Stay (HOSPITAL_COMMUNITY)
Admission: EM | Admit: 2017-01-26 | Discharge: 2017-01-29 | DRG: 854 | Disposition: A | Discharge status: Home Health | Payer: Self-pay | Attending: Internal Medicine | Admitting: Internal Medicine

## 2017-01-26 ENCOUNTER — Emergency Department (HOSPITAL_COMMUNITY): Payer: Self-pay

## 2017-01-26 DIAGNOSIS — Z79899 Other long term (current) drug therapy: Secondary | ICD-10-CM

## 2017-01-26 DIAGNOSIS — Z87891 Personal history of nicotine dependence: Secondary | ICD-10-CM

## 2017-01-26 DIAGNOSIS — T380X5A Adverse effect of glucocorticoids and synthetic analogues, initial encounter: Secondary | ICD-10-CM | POA: Diagnosis not present

## 2017-01-26 DIAGNOSIS — Z8614 Personal history of Methicillin resistant Staphylococcus aureus infection: Secondary | ICD-10-CM

## 2017-01-26 DIAGNOSIS — B20 Human immunodeficiency virus [HIV] disease: Secondary | ICD-10-CM | POA: Diagnosis present

## 2017-01-26 DIAGNOSIS — L0291 Cutaneous abscess, unspecified: Secondary | ICD-10-CM

## 2017-01-26 DIAGNOSIS — E785 Hyperlipidemia, unspecified: Secondary | ICD-10-CM | POA: Diagnosis present

## 2017-01-26 DIAGNOSIS — B9620 Unspecified Escherichia coli [E. coli] as the cause of diseases classified elsewhere: Secondary | ICD-10-CM | POA: Diagnosis present

## 2017-01-26 DIAGNOSIS — A419 Sepsis, unspecified organism: Principal | ICD-10-CM | POA: Diagnosis present

## 2017-01-26 DIAGNOSIS — Z21 Asymptomatic human immunodeficiency virus [HIV] infection status: Secondary | ICD-10-CM | POA: Diagnosis present

## 2017-01-26 DIAGNOSIS — I10 Essential (primary) hypertension: Secondary | ICD-10-CM | POA: Diagnosis present

## 2017-01-26 DIAGNOSIS — R220 Localized swelling, mass and lump, head: Secondary | ICD-10-CM | POA: Diagnosis not present

## 2017-01-26 DIAGNOSIS — Z8249 Family history of ischemic heart disease and other diseases of the circulatory system: Secondary | ICD-10-CM

## 2017-01-26 DIAGNOSIS — T360X5A Adverse effect of penicillins, initial encounter: Secondary | ICD-10-CM | POA: Diagnosis not present

## 2017-01-26 DIAGNOSIS — K611 Rectal abscess: Secondary | ICD-10-CM | POA: Diagnosis present

## 2017-01-26 DIAGNOSIS — D72829 Elevated white blood cell count, unspecified: Secondary | ICD-10-CM | POA: Diagnosis not present

## 2017-01-26 DIAGNOSIS — D649 Anemia, unspecified: Secondary | ICD-10-CM | POA: Diagnosis present

## 2017-01-26 DIAGNOSIS — K612 Anorectal abscess: Secondary | ICD-10-CM | POA: Diagnosis present

## 2017-01-26 DIAGNOSIS — K769 Liver disease, unspecified: Secondary | ICD-10-CM | POA: Diagnosis present

## 2017-01-26 HISTORY — PX: INCISION AND DRAINAGE PERIRECTAL ABSCESS: SHX1804

## 2017-01-26 LAB — COMPREHENSIVE METABOLIC PANEL
ALBUMIN: 3.5 g/dL (ref 3.5–5.0)
ALT: 17 U/L (ref 17–63)
AST: 19 U/L (ref 15–41)
Alkaline Phosphatase: 80 U/L (ref 38–126)
Anion gap: 11 (ref 5–15)
BILIRUBIN TOTAL: 0.6 mg/dL (ref 0.3–1.2)
BUN: 14 mg/dL (ref 6–20)
CHLORIDE: 106 mmol/L (ref 101–111)
CO2: 20 mmol/L — ABNORMAL LOW (ref 22–32)
Calcium: 9.5 mg/dL (ref 8.9–10.3)
Creatinine, Ser: 1.22 mg/dL (ref 0.61–1.24)
GFR calc Af Amer: >60 mL/min (ref >60)
GFR calc non Af Amer: >60 mL/min (ref >60)
GLUCOSE: 122 mg/dL — AB (ref 65–99)
POTASSIUM: 3.9 mmol/L (ref 3.5–5.1)
Sodium: 137 mmol/L (ref 135–145)
Total Protein: 8.7 g/dL — ABNORMAL HIGH (ref 6.5–8.1)

## 2017-01-26 LAB — CBC WITH DIFFERENTIAL/PLATELET
Basophils Absolute: 0 10*3/uL (ref 0.0–0.1)
Basophils Relative: 0 %
Eosinophils Absolute: 0.1 10*3/uL (ref 0.0–0.7)
Eosinophils Relative: 1 %
HEMATOCRIT: 34.9 % — AB (ref 39.0–52.0)
Hemoglobin: 12.2 g/dL — ABNORMAL LOW (ref 13.0–17.0)
Lymphocytes Relative: 25 %
Lymphs Abs: 3.3 10*3/uL (ref 0.7–4.0)
MCH: 30.8 pg (ref 26.0–34.0)
MCHC: 35 g/dL (ref 30.0–36.0)
MCV: 88.1 fL (ref 78.0–100.0)
MONO ABS: 0.9 10*3/uL (ref 0.1–1.0)
Monocytes Relative: 7 %
NEUTROS ABS: 8.8 10*3/uL — AB (ref 1.7–7.7)
NEUTROS PCT: 67 %
PLATELETS: 438 10*3/uL — AB (ref 150–400)
RBC: 3.96 MIL/uL — ABNORMAL LOW (ref 4.22–5.81)
RDW: 13 % (ref 11.5–15.5)
WBC: 13.1 10*3/uL — AB (ref 4.0–10.5)

## 2017-01-26 LAB — LACTIC ACID, PLASMA: Lactic Acid, Venous: 1.3 mmol/L (ref 0.5–1.9)

## 2017-01-26 SURGERY — INCISION AND DRAINAGE, ABSCESS, PERIRECTAL
Anesthesia: General | Site: Rectum

## 2017-01-26 MED ORDER — ONDANSETRON HCL 4 MG/2ML IJ SOLN: 4.0000 mg | Freq: Once | INTRAMUSCULAR | Status: DC | PRN

## 2017-01-26 MED ORDER — MIDAZOLAM HCL 5 MG/5ML IJ SOLN
INTRAMUSCULAR | Status: DC | PRN
  Administered 2017-01-26: 2 mg via INTRAVENOUS

## 2017-01-26 MED ORDER — PHENYLEPHRINE HCL 10 MG/ML IJ SOLN
INTRAMUSCULAR | Status: DC | PRN
  Administered 2017-01-26 (×2): 120 ug via INTRAVENOUS
  Administered 2017-01-26: 80 ug via INTRAVENOUS

## 2017-01-26 MED ORDER — FENTANYL CITRATE (PF) 100 MCG/2ML IJ SOLN
25.0000 ug | INTRAMUSCULAR | Status: DC | PRN
  Administered 2017-01-26 (×2): 50 ug via INTRAVENOUS

## 2017-01-26 MED ORDER — DIPHENHYDRAMINE HCL 50 MG/ML IJ SOLN
25.0000 mg | Freq: Four times a day (QID) | INTRAMUSCULAR | Status: DC | PRN
  Administered 2017-01-26 – 2017-01-27 (×2): 25 mg via INTRAVENOUS
  Filled 2017-01-26 (×2): qty 1

## 2017-01-26 MED ORDER — ONDANSETRON HCL 4 MG/2ML IJ SOLN
INTRAMUSCULAR | Status: DC | PRN
  Administered 2017-01-26: 4 mg via INTRAVENOUS

## 2017-01-26 MED ORDER — FENTANYL CITRATE (PF) 100 MCG/2ML IJ SOLN
INTRAMUSCULAR | Status: AC
  Filled 2017-01-26: qty 2

## 2017-01-26 MED ORDER — ONDANSETRON 4 MG PO TBDP: 4.0000 mg | ORAL_TABLET | Freq: Four times a day (QID) | ORAL | Status: DC | PRN

## 2017-01-26 MED ORDER — PROPOFOL 10 MG/ML IV BOLUS
INTRAVENOUS | Status: DC | PRN
  Administered 2017-01-26: 50 mg via INTRAVENOUS
  Administered 2017-01-26: 150 mg via INTRAVENOUS

## 2017-01-26 MED ORDER — SUGAMMADEX SODIUM 200 MG/2ML IV SOLN
INTRAVENOUS | Status: DC | PRN
  Administered 2017-01-26: 200 mg via INTRAVENOUS

## 2017-01-26 MED ORDER — OXYCODONE HCL 5 MG PO TABS
5.0000 mg | ORAL_TABLET | ORAL | Status: DC | PRN
  Administered 2017-01-27: 5 mg via ORAL
  Administered 2017-01-28 – 2017-01-29 (×4): 10 mg via ORAL
  Filled 2017-01-26 (×2): qty 1
  Filled 2017-01-26 (×3): qty 2
  Filled 2017-01-26: qty 1

## 2017-01-26 MED ORDER — HYDROCHLOROTHIAZIDE 25 MG PO TABS
25.0000 mg | ORAL_TABLET | Freq: Every day | ORAL | Status: DC
  Administered 2017-01-26 – 2017-01-29 (×4): 25 mg via ORAL
  Filled 2017-01-26 (×4): qty 1

## 2017-01-26 MED ORDER — EMTRICITABINE-TENOFOVIR AF 200-25 MG PO TABS
1.0000 | ORAL_TABLET | Freq: Every day | ORAL | Status: DC
  Administered 2017-01-26 – 2017-01-29 (×4): 1 via ORAL
  Filled 2017-01-26 (×4): qty 1

## 2017-01-26 MED ORDER — VANCOMYCIN HCL IN DEXTROSE 1-5 GM/200ML-% IV SOLN
1000.0000 mg | Freq: Once | INTRAVENOUS | Status: AC
  Administered 2017-01-26: 1000 mg via INTRAVENOUS

## 2017-01-26 MED ORDER — MORPHINE SULFATE (PF) 2 MG/ML IV SOLN
1.0000 mg | INTRAVENOUS | Status: DC | PRN
  Administered 2017-01-27 – 2017-01-28 (×2): 2 mg via INTRAVENOUS
  Filled 2017-01-26 (×3): qty 1

## 2017-01-26 MED ORDER — DEXTROSE 5 % IV SOLN
1.0000 g | Freq: Three times a day (TID) | INTRAVENOUS | Status: DC
  Administered 2017-01-27 – 2017-01-28 (×6): 1 g via INTRAVENOUS
  Filled 2017-01-26 (×8): qty 1

## 2017-01-26 MED ORDER — ROSUVASTATIN CALCIUM 10 MG PO TABS
10.0000 mg | ORAL_TABLET | Freq: Every day | ORAL | Status: DC
  Administered 2017-01-26 – 2017-01-28 (×3): 10 mg via ORAL
  Filled 2017-01-26 (×3): qty 1

## 2017-01-26 MED ORDER — IOPAMIDOL (ISOVUE-300) INJECTION 61%
INTRAVENOUS | Status: AC
  Filled 2017-01-26: qty 100

## 2017-01-26 MED ORDER — FAMOTIDINE 20 MG PO TABS
20.0000 mg | ORAL_TABLET | Freq: Two times a day (BID) | ORAL | Status: DC
  Administered 2017-01-27 – 2017-01-29 (×5): 20 mg via ORAL
  Filled 2017-01-26 (×5): qty 1

## 2017-01-26 MED ORDER — VALACYCLOVIR HCL 500 MG PO TABS
500.0000 mg | ORAL_TABLET | Freq: Every day | ORAL | Status: DC
  Administered 2017-01-26 – 2017-01-29 (×4): 500 mg via ORAL
  Filled 2017-01-26 (×4): qty 1

## 2017-01-26 MED ORDER — DEXAMETHASONE SODIUM PHOSPHATE 10 MG/ML IJ SOLN
INTRAMUSCULAR | Status: DC | PRN
  Administered 2017-01-26: 10 mg via INTRAVENOUS

## 2017-01-26 MED ORDER — DOLUTEGRAVIR SODIUM 50 MG PO TABS
50.0000 mg | ORAL_TABLET | Freq: Every day | ORAL | Status: DC
  Administered 2017-01-26 – 2017-01-29 (×4): 50 mg via ORAL
  Filled 2017-01-26 (×4): qty 1

## 2017-01-26 MED ORDER — LIDOCAINE 2% (20 MG/ML) 5 ML SYRINGE
INTRAMUSCULAR | Status: DC | PRN
  Administered 2017-01-26: 40 mg via INTRAVENOUS

## 2017-01-26 MED ORDER — ACETAMINOPHEN 500 MG PO TABS
1000.0000 mg | ORAL_TABLET | Freq: Four times a day (QID) | ORAL | Status: DC
  Administered 2017-01-26 – 2017-01-29 (×12): 1000 mg via ORAL
  Filled 2017-01-26 (×12): qty 2

## 2017-01-26 MED ORDER — HYDROGEN PEROXIDE 3 % EX SOLN
CUTANEOUS | Status: DC | PRN
  Administered 2017-01-26: 1

## 2017-01-26 MED ORDER — SUCCINYLCHOLINE CHLORIDE 200 MG/10ML IV SOSY
PREFILLED_SYRINGE | INTRAVENOUS | Status: DC | PRN
  Administered 2017-01-26 (×2): 100 mg via INTRAVENOUS

## 2017-01-26 MED ORDER — DOCUSATE SODIUM 100 MG PO CAPS
100.0000 mg | ORAL_CAPSULE | Freq: Two times a day (BID) | ORAL | Status: DC
  Administered 2017-01-26 – 2017-01-29 (×6): 100 mg via ORAL
  Filled 2017-01-26 (×6): qty 1

## 2017-01-26 MED ORDER — LIDOCAINE 2% (20 MG/ML) 5 ML SYRINGE
INTRAMUSCULAR | Status: AC
  Filled 2017-01-26: qty 5

## 2017-01-26 MED ORDER — SODIUM CHLORIDE 0.9 % IV SOLN
INTRAVENOUS | Status: DC
  Administered 2017-01-28 (×2): via INTRAVENOUS

## 2017-01-26 MED ORDER — FAMOTIDINE IN NACL 20-0.9 MG/50ML-% IV SOLN
20.0000 mg | Freq: Once | INTRAVENOUS | Status: AC
  Administered 2017-01-27: 20 mg via INTRAVENOUS
  Filled 2017-01-26: qty 50

## 2017-01-26 MED ORDER — PIPERACILLIN-TAZOBACTAM 3.375 G IVPB: 3.3750 g | INTRAVENOUS | Status: DC

## 2017-01-26 MED ORDER — ACETAMINOPHEN 325 MG PO TABS
650.0000 mg | ORAL_TABLET | Freq: Once | ORAL | Status: AC
  Administered 2017-01-26: 650 mg via ORAL
  Filled 2017-01-26: qty 2

## 2017-01-26 MED ORDER — HYDROMORPHONE HCL 1 MG/ML IJ SOLN
1.0000 mg | Freq: Once | INTRAMUSCULAR | Status: AC
  Administered 2017-01-26: 1 mg via INTRAVENOUS
  Filled 2017-01-26: qty 1

## 2017-01-26 MED ORDER — MEPERIDINE HCL 50 MG/ML IJ SOLN: 6.2500 mg | INTRAMUSCULAR | Status: DC | PRN

## 2017-01-26 MED ORDER — PANTOPRAZOLE SODIUM 40 MG PO TBEC
40.0000 mg | DELAYED_RELEASE_TABLET | Freq: Every day | ORAL | Status: DC
  Administered 2017-01-26 – 2017-01-29 (×4): 40 mg via ORAL
  Filled 2017-01-26 (×4): qty 1

## 2017-01-26 MED ORDER — VANCOMYCIN HCL IN DEXTROSE 1-5 GM/200ML-% IV SOLN
INTRAVENOUS | Status: AC
  Administered 2017-01-26: 1000 mg via INTRAVENOUS
  Filled 2017-01-26: qty 200

## 2017-01-26 MED ORDER — VANCOMYCIN HCL IN DEXTROSE 750-5 MG/150ML-% IV SOLN
750.0000 mg | Freq: Two times a day (BID) | INTRAVENOUS | Status: DC
  Administered 2017-01-27 – 2017-01-29 (×5): 750 mg via INTRAVENOUS
  Filled 2017-01-26 (×6): qty 150

## 2017-01-26 MED ORDER — GABAPENTIN 300 MG PO CAPS
300.0000 mg | ORAL_CAPSULE | Freq: Two times a day (BID) | ORAL | Status: DC
  Administered 2017-01-26 – 2017-01-29 (×6): 300 mg via ORAL
  Filled 2017-01-26 (×6): qty 1

## 2017-01-26 MED ORDER — LISINOPRIL 20 MG PO TABS
40.0000 mg | ORAL_TABLET | Freq: Every day | ORAL | Status: DC
  Administered 2017-01-26: 40 mg via ORAL
  Filled 2017-01-26: qty 2

## 2017-01-26 MED ORDER — AZTREONAM 1 G IJ SOLR: 1.0000 g | Freq: Three times a day (TID) | INTRAMUSCULAR | Status: DC

## 2017-01-26 MED ORDER — LABETALOL HCL 5 MG/ML IV SOLN
INTRAVENOUS | Status: DC | PRN
  Administered 2017-01-26 (×2): 2.5 mg via INTRAVENOUS

## 2017-01-26 MED ORDER — PIPERACILLIN-TAZOBACTAM 3.375 G IVPB 30 MIN
3.3750 g | INTRAVENOUS | Status: AC
  Administered 2017-01-26: 3.375 g via INTRAVENOUS

## 2017-01-26 MED ORDER — SODIUM CHLORIDE 0.9 % IV BOLUS (SEPSIS)
1000.0000 mL | Freq: Once | INTRAVENOUS | Status: AC
  Administered 2017-01-26: 1000 mL via INTRAVENOUS

## 2017-01-26 MED ORDER — BUPIVACAINE-EPINEPHRINE 0.25% -1:200000 IJ SOLN
INTRAMUSCULAR | Status: AC
  Filled 2017-01-26: qty 1

## 2017-01-26 MED ORDER — PIPERACILLIN-TAZOBACTAM 3.375 G IVPB
3.3750 g | Freq: Three times a day (TID) | INTRAVENOUS | Status: DC
  Administered 2017-01-26: 3.375 g via INTRAVENOUS
  Filled 2017-01-26: qty 50

## 2017-01-26 MED ORDER — ROCURONIUM BROMIDE 50 MG/5ML IV SOSY
PREFILLED_SYRINGE | INTRAVENOUS | Status: DC | PRN
  Administered 2017-01-26: 20 mg via INTRAVENOUS

## 2017-01-26 MED ORDER — ONDANSETRON HCL 4 MG/2ML IJ SOLN
INTRAMUSCULAR | Status: AC
  Filled 2017-01-26: qty 2

## 2017-01-26 MED ORDER — ONDANSETRON HCL 4 MG/2ML IJ SOLN: 4.0000 mg | Freq: Four times a day (QID) | INTRAMUSCULAR | Status: DC | PRN

## 2017-01-26 MED ORDER — AMLODIPINE BESYLATE 10 MG PO TABS
10.0000 mg | ORAL_TABLET | Freq: Every day | ORAL | Status: DC
  Administered 2017-01-26 – 2017-01-29 (×4): 10 mg via ORAL
  Filled 2017-01-26 (×4): qty 1

## 2017-01-26 MED ORDER — LABETALOL HCL 5 MG/ML IV SOLN
10.0000 mg | INTRAVENOUS | Status: AC | PRN
  Administered 2017-01-26 (×2): 10 mg via INTRAVENOUS

## 2017-01-26 MED ORDER — HYDROGEN PEROXIDE 3 % EX SOLN
CUTANEOUS | Status: AC
  Filled 2017-01-26: qty 473

## 2017-01-26 MED ORDER — LACTATED RINGERS IV SOLN
INTRAVENOUS | Status: DC
  Administered 2017-01-26: 14:00:00 via INTRAVENOUS

## 2017-01-26 MED ORDER — MIDAZOLAM HCL 2 MG/2ML IJ SOLN
INTRAMUSCULAR | Status: AC
  Filled 2017-01-26: qty 2

## 2017-01-26 MED ORDER — LABETALOL HCL 5 MG/ML IV SOLN
INTRAVENOUS | Status: AC
  Filled 2017-01-26: qty 4

## 2017-01-26 MED ORDER — HEPARIN SODIUM (PORCINE) 5000 UNIT/ML IJ SOLN
5000.0000 [IU] | Freq: Three times a day (TID) | INTRAMUSCULAR | Status: DC
  Administered 2017-01-27 – 2017-01-29 (×7): 5000 [IU] via SUBCUTANEOUS
  Filled 2017-01-26 (×7): qty 1

## 2017-01-26 MED ORDER — FENTANYL CITRATE (PF) 100 MCG/2ML IJ SOLN
INTRAMUSCULAR | Status: DC | PRN
  Administered 2017-01-26 (×2): 50 ug via INTRAVENOUS

## 2017-01-26 MED ORDER — PROPOFOL 10 MG/ML IV BOLUS
INTRAVENOUS | Status: AC
  Filled 2017-01-26: qty 40

## 2017-01-26 MED ORDER — IOPAMIDOL (ISOVUE-300) INJECTION 61%
100.0000 mL | Freq: Once | INTRAVENOUS | Status: AC | PRN
  Administered 2017-01-26: 100 mL via INTRAVENOUS

## 2017-01-26 MED ORDER — PIPERACILLIN-TAZOBACTAM 3.375 G IVPB
INTRAVENOUS | Status: AC
  Filled 2017-01-26: qty 50

## 2017-01-26 MED ORDER — MORPHINE SULFATE (PF) 4 MG/ML IV SOLN
4.0000 mg | Freq: Once | INTRAVENOUS | Status: AC
  Administered 2017-01-26: 4 mg via INTRAVENOUS
  Filled 2017-01-26: qty 1

## 2017-01-26 MED ORDER — DEXAMETHASONE SODIUM PHOSPHATE 10 MG/ML IJ SOLN
INTRAMUSCULAR | Status: AC
  Filled 2017-01-26: qty 1

## 2017-01-26 MED ORDER — POTASSIUM CHLORIDE IN NACL 20-0.9 MEQ/L-% IV SOLN
INTRAVENOUS | Status: DC
  Administered 2017-01-26 – 2017-01-27 (×2): 50 mL/h via INTRAVENOUS
  Filled 2017-01-26 (×3): qty 1000

## 2017-01-26 SURGICAL SUPPLY — 35 items
ADH SKN CLS APL DERMABOND .7 (GAUZE/BANDAGES/DRESSINGS)
APL SKNCLS STERI-STRIP NONHPOA (GAUZE/BANDAGES/DRESSINGS)
BENZOIN TINCTURE PRP APPL 2/3 (GAUZE/BANDAGES/DRESSINGS) IMPLANT
COVER SURGICAL LIGHT HANDLE (MISCELLANEOUS) ×3 IMPLANT
DECANTER SPIKE VIAL GLASS SM (MISCELLANEOUS) IMPLANT
DERMABOND ADVANCED (GAUZE/BANDAGES/DRESSINGS)
DERMABOND ADVANCED .7 DNX12 (GAUZE/BANDAGES/DRESSINGS) IMPLANT
DRAPE LAPAROSCOPIC ABDOMINAL (DRAPES) IMPLANT
DRAPE LAPAROTOMY T 102X78X121 (DRAPES) IMPLANT
DRAPE LAPAROTOMY T 98X78 PEDS (DRAPES) ×2 IMPLANT
DRAPE LAPAROTOMY TRNSV 102X78 (DRAPE) IMPLANT
DRAPE SHEET LG 3/4 BI-LAMINATE (DRAPES) IMPLANT
DRAPE UTILITY XL STRL (DRAPES) ×3 IMPLANT
ELECT REM PT RETURN 15FT ADLT (MISCELLANEOUS) ×3 IMPLANT
GAUZE PACKING IODOFORM 1/4X15 (GAUZE/BANDAGES/DRESSINGS) ×2 IMPLANT
GAUZE SPONGE 4X4 12PLY STRL (GAUZE/BANDAGES/DRESSINGS) ×3 IMPLANT
GLOVE BIO SURGEON STRL SZ7.5 (GLOVE) ×3 IMPLANT
GLOVE INDICATOR 8.0 STRL GRN (GLOVE) ×3 IMPLANT
GOWN STRL REUS W/TWL XL LVL3 (GOWN DISPOSABLE) ×6 IMPLANT
KIT BASIN OR (CUSTOM PROCEDURE TRAY) ×3 IMPLANT
MARKER SKIN DUAL TIP RULER LAB (MISCELLANEOUS) ×2 IMPLANT
NDL HYPO 25X1 1.5 SAFETY (NEEDLE) ×1 IMPLANT
NEEDLE HYPO 25X1 1.5 SAFETY (NEEDLE) ×3 IMPLANT
PACK GENERAL/GYN (CUSTOM PROCEDURE TRAY) ×3 IMPLANT
SPONGE HEMORRHOID 8X3CM (HEMOSTASIS) ×2 IMPLANT
SPONGE LAP 18X18 X RAY DECT (DISPOSABLE) IMPLANT
SPONGE LAP 4X18 X RAY DECT (DISPOSABLE) IMPLANT
STAPLER VISISTAT 35W (STAPLE) IMPLANT
SUT MNCRL AB 4-0 PS2 18 (SUTURE) IMPLANT
SUT VIC AB 3-0 SH 18 (SUTURE) IMPLANT
SWAB COLLECTION DEVICE MRSA (MISCELLANEOUS) IMPLANT
SWAB CULTURE ESWAB REG 1ML (MISCELLANEOUS) ×2 IMPLANT
SYR CONTROL 10ML LL (SYRINGE) ×3 IMPLANT
TOWEL OR 17X26 10 PK STRL BLUE (TOWEL DISPOSABLE) ×3 IMPLANT
TOWEL OR NON WOVEN STRL DISP B (DISPOSABLE) ×3 IMPLANT

## 2017-01-26 NOTE — ED Notes (Signed)
OR nurse at bedside

## 2017-01-26 NOTE — Progress Notes (Signed)
A consult was received from an ED physician for Vancomycin and Zosyn per pharmacy dosing.  The patient's profile has been reviewed for ht/wt/allergies/indication/available labs.   A one time order has been placed for Zosyn 3.375g and Vancomycin 1g IV.  Further antibiotics/pharmacy consults should be ordered by admitting physician if indicated.                       Thank you, Lynann Beaver PharmD, BCPS Pager [phone] 01/26/2017 1:11 PM

## 2017-01-26 NOTE — ED Notes (Signed)
Patient transported to CT 

## 2017-01-26 NOTE — Transfer of Care (Signed)
Immediate Anesthesia Transfer of Care Note  Patient: [name]  Procedure(s) Performed: Procedure(s): IRRIGATION AND DEBRIDEMENT PERIRECTAL ABSCESS (N/A)  Patient Location: PACU  Anesthesia Type:General  Level of Consciousness:  sedated, patient cooperative and responds to stimulation  Airway & Oxygen Therapy:Patient Spontanous Breathing and Patient connected to face mask oxgen  Post-op Assessment:  Report given to PACU RN and Post -op Vital signs reviewed and stable  Post vital signs:  Reviewed and stable  Last Vitals:  Vitals:   01/26/17 1239 01/26/17 1538  BP:  (!) (P) 168/101  Pulse:  (!) (P) 106  Resp:    Temp: 37.3 C (P) 37.1 C  SpO2:  (P) 100%    Complications: No apparent anesthesia complications

## 2017-01-26 NOTE — ED Provider Notes (Signed)
[facility]-EMERGENCY DEPT Provider Note   CSN: [id number] Arrival date & time: 01/26/17  1191     History   Chief Complaint Chief Complaint  Patient presents with  . Abscess    HPI [name] is a 61 y.o. male.   The history is provided by the patient and medical records.  Abscess  Location:  Pelvis Pelvic abscess location:  Perianal Abscess quality: fluctuance, induration, painful and warmth   Red streaking: no   Duration:  1 week Progression:  Worsening Pain details:    Quality:  Sharp   Severity:  Severe (2000/10)   Duration:  1 week   Timing:  Constant   Progression:  Worsening Chronicity:  Recurrent (from 15 years ago) Context: not diabetes   Relieved by:  Nothing Worsened by:  Nothing Ineffective treatments:  Oral antibiotics (augmentin and doxy for 2 days) Associated symptoms: no fatigue, no fever, no headaches, no nausea and no vomiting   Risk factors: hx of MRSA and prior abscess     Past Medical History:  Diagnosis Date  . HIV infection (HCC)   . Hypertension     Patient Active Problem List   Diagnosis Date Noted  . Diarrhea 01/12/2017  . Genital herpes 03/12/2014  . Penile wart 03/12/2014  . Urethritis, nongonococcal 03/06/2013  . HYPERLIPIDEMIA 05/02/2007  . WEIGHT LOSS 08/30/2006  . Human immunodeficiency virus (HIV) disease (HCC) 05/30/2006  . HYPERTENSION 05/30/2006  . DENTAL CARIES 05/30/2006  . METHICILLIN RESISTANT STAPHYLOCOCCUS AUREUS INFECTION 10/31/2004    History reviewed. No pertinent surgical history.     Home Medications    Prior to Admission medications   Medication Sig Start Date End Date Taking? Authorizing Provider  amLODipine (NORVASC) 10 MG tablet Take 1 tablet (10 mg total) by mouth daily. 10/10/16   Kuppelweiser, Cassie L, RPH-CPP  amoxicillin-clavulanate (AUGMENTIN) 875-125 MG tablet Take 1 tablet by mouth 2 (two) times daily. 01/24/17   Judyann Munson, MD  dolutegravir (TIVICAY) 50 MG tablet Take 1 tablet (50  mg total) by mouth daily. 08/16/16   Judyann Munson, MD  doxycycline (VIBRA-TABS) 100 MG tablet Take 1 tablet (100 mg total) by mouth 2 (two) times daily. 01/24/17   Judyann Munson, MD  emtricitabine-tenofovir AF (DESCOVY) 200-25 MG tablet Take 1 tablet by mouth daily. 08/16/16   Judyann Munson, MD  hydrochlorothiazide (HYDRODIURIL) 25 MG tablet Take 1 tablet (25 mg total) by mouth daily. 10/10/16   Kuppelweiser, Cassie L, RPH-CPP  hydrocortisone cream 1 % Apply 1 application topically 2 (two) times daily. 10/24/16   Judyann Munson, MD  lisinopril (PRINIVIL,ZESTRIL) 40 MG tablet Take 1 tablet (40 mg total) by mouth daily. 10/10/16   Kuppelweiser, Cassie L, RPH-CPP  megestrol (MEGACE) 400 MG/10ML suspension Take 10 mLs (400 mg total) by mouth daily. 01/24/17   Judyann Munson, MD  valACYclovir (VALTREX) 500 MG tablet Take 1 tablet (500 mg total) by mouth daily. 08/11/15   Judyann Munson, MD    Family History No family history on file.  Social History Social History  Substance Use Topics  . Smoking status: Former Games developer  . Smokeless tobacco: Never Used     Comment: occ use  . Alcohol use 0.0 oz/week     Comment: occ     Allergies   Patient has no known allergies.   Review of Systems Review of Systems  Constitutional: Negative for chills, diaphoresis, fatigue and fever.  HENT: Negative for congestion.   Respiratory: Negative for chest tightness, shortness of breath, wheezing  and stridor.   Cardiovascular: Negative for chest pain and palpitations.  Gastrointestinal: Positive for rectal pain. Negative for abdominal pain, constipation, diarrhea, nausea and vomiting.  Genitourinary: Negative for flank pain and frequency.  Musculoskeletal: Negative for back pain, neck pain and neck stiffness.  Skin: Negative for rash and wound.  Neurological: Negative for light-headedness, numbness and headaches.  Psychiatric/Behavioral: Negative for agitation and confusion.  All other systems reviewed and  are negative.    Physical Exam Updated Vital Signs BP (!) 164/111 (BP Location: Left Arm) Comment: informed the nurse of b/p  Pulse (!) 114   Temp 98.2 F (36.8 C) (Oral)   Resp (!) 22   SpO2 100%   Physical Exam  Constitutional: He is oriented to person, place, and time. He appears well-developed and well-nourished. No distress.  HENT:  Head: Normocephalic and atraumatic.  Mouth/Throat: Oropharynx is clear and moist. No oropharyngeal exudate.  Eyes: Pupils are equal, round, and reactive to light. Conjunctivae and EOM are normal.  Neck: Neck supple.  Cardiovascular: Intact distal pulses.  Tachycardia present.   No murmur heard. Pulmonary/Chest: Effort normal and breath sounds normal. No stridor. No respiratory distress.  Abdominal: Soft. There is no tenderness. There is no guarding.  Genitourinary: Rectal exam shows tenderness. Rectal exam shows no external hemorrhoid.     Genitourinary Comments: Fluctuance and tenderness on L and tenderness on L with rectal exam. No draiange.   Musculoskeletal: He exhibits tenderness. He exhibits no edema.  Neurological: He is alert and oriented to person, place, and time. No sensory deficit. He exhibits normal muscle tone.  Skin: Skin is warm and dry. Capillary refill takes less than 2 seconds. He is not diaphoretic. No erythema. No pallor.  Psychiatric: He has a normal mood and affect.  Nursing note and vitals reviewed.    ED Treatments / Results  Labs (all labs ordered are listed, but only abnormal results are displayed) Labs Reviewed  CBC WITH DIFFERENTIAL/PLATELET - Abnormal; Notable for the following:       Result Value   WBC 13.1 (*)    RBC 3.96 (*)    Hemoglobin 12.2 (*)    HCT 34.9 (*)    Platelets 438 (*)    Neutro Abs 8.8 (*)    All other components within normal limits  COMPREHENSIVE METABOLIC PANEL - Abnormal; Notable for the following:    CO2 20 (*)    Glucose, Bld 122 (*)    Total Protein 8.7 (*)    All other  components within normal limits  URINE CULTURE  CULTURE, BLOOD (ROUTINE X 2)  CULTURE, BLOOD (ROUTINE X 2)  AEROBIC/ANAEROBIC CULTURE (SURGICAL/DEEP WOUND)  LACTIC ACID, PLASMA  URINALYSIS, ROUTINE W REFLEX MICROSCOPIC  LACTIC ACID, PLASMA  I-STAT CG4 LACTIC ACID, ED    EKG  EKG Interpretation  Date/Time:  Thursday January 26 2017 08:39:29 EDT Ventricular Rate:  113 PR Interval:    QRS Duration: 94 QT Interval:  334 QTC Calculation: 458 R Axis:   71 Text Interpretation:  Sinus tachycardia No prior ECG for comparison.  No STEMI Confirmed by Theda Belfast ([id number]) on 01/26/2017 9:14:05 AM       Radiology Ct Abdomen Pelvis W Contrast  Result Date: 01/26/2017 CLINICAL DATA:  61 year old hypertensive male with rectal abscess. HIV. Initial encounter. EXAM: CT ABDOMEN AND PELVIS WITH CONTRAST TECHNIQUE: Multidetector CT imaging of the abdomen and pelvis was performed using the standard protocol following bolus administration of intravenous contrast. CONTRAST:  ISOVUE-300 IOPAMIDOL (ISOVUE-300) INJECTION  61% COMPARISON:  None. FINDINGS: Lower chest: Scarring/atelectasis lung bases.  Normal heart size. Hepatobiliary: 4.8 cm enhancing lesion posterior medial right lobe of liver (segment 6). 1.7 cm enhancing lesion mid aspect segment 6. No calcified gallstone. Pancreas: No pancreatic mass or inflammation. Spleen: No splenic mass or enlargement. Adrenals/Urinary Tract: Subcentimeter low-density structure right adrenal gland probably a small adenoma. No left adrenal lesion. No renal obstructing stone or hydronephrosis. Bilateral renal low-density structures, larger ones which are cysts, others too small to characterize although statistically likely cysts. Stomach/Bowel: 3.5 x 2 x 4.7 cm abscess located between the left rectum/anus and left external sphincter. Prominent thickening of the left external sphincter with haziness of fat planes within the left ischialanal/ ischialrectal fossa  consistent with spread of inflammatory process. There is also hazy infiltration of the subcutaneous fat of the left buttock consistent with cellulitis. With the spread of infection, possibility of fistula is raised although not well delineated. Abscess causes narrowing of the distal rectum. Perianal abscess extends towards the base of the penis. Portions of bowel/ stomach under distended and evaluation limited. Partially fluid-filled cecum. No other evidence of extraluminal bowel inflammatory process. No free intraperitoneal air. Vascular/Lymphatic: Atherosclerotic changes aorta and aortic branch vessels. No aortic aneurysm or large vessel occlusion. Shotty inguinal lymph nodes with fatty centers within normal limits. Normal size retroperitoneal lymph nodes. Reproductive: Perianal abscess extends towards the base of the penis. Tiny prostate gland calcifications. Other: No free intraperitoneal air. Musculoskeletal: No worrisome osseous lesion. IMPRESSION: Left perianal abscess with adjacent cellulitis as detailed above. 4.8 cm enhancing lesion posterior medial right lobe of liver (segment 6). 1.7 cm enhancing lesion mid aspect segment 6. It is possible these areas represent hemangiomas although incompletely assessed on present exam. Findings can be evaluated with dedicated contrast enhanced liver MR after present acute episode has cleared. Aortic Atherosclerosis (ICD10-I70.0). Electronically Signed   By: Lacy Duverney M.D.   On: 01/26/2017 11:13    Procedures Procedures (including critical care time)  Medications Ordered in ED Medications  lactated ringers infusion ( Intravenous Anesthesia Volume Adjustment 01/26/17 1543)  fentaNYL (SUBLIMAZE) injection 25-50 mcg (not administered)  meperidine (DEMEROL) injection 6.25-12.5 mg (not administered)  ondansetron (ZOFRAN) injection 4 mg (not administered)  sodium chloride 0.9 % bolus 1,000 mL (0 mLs Intravenous Stopped 01/26/17 1003)  morphine 4 MG/ML  injection 4 mg (4 mg Intravenous Given 01/26/17 0840)  HYDROmorphone (DILAUDID) injection 1 mg (1 mg Intravenous Given 01/26/17 0912)  iopamidol (ISOVUE-300) 61 % injection 100 mL (100 mLs Intravenous Contrast Given 01/26/17 1025)  HYDROmorphone (DILAUDID) injection 1 mg (1 mg Intravenous Given 01/26/17 1053)  HYDROmorphone (DILAUDID) injection 1 mg (1 mg Intravenous Given 01/26/17 1204)  acetaminophen (TYLENOL) tablet 650 mg (650 mg Oral Given 01/26/17 1212)  piperacillin-tazobactam (ZOSYN) IVPB 3.375 g (3.375 g Intravenous Given 01/26/17 1435)  vancomycin (VANCOCIN) IVPB 1000 mg/200 mL premix (1,000 mg Intravenous Given 01/26/17 1418)  piperacillin-tazobactam (ZOSYN) 3.375 GM/50ML IVPB (  Override pull for Anesthesia 01/26/17 1435)     Initial Impression / Assessment and Plan / ED Course  I have reviewed the triage vital signs and the nursing notes.  Pertinent labs & imaging results that were available during my care of the patient were reviewed by me and considered in my medical decision making (see chart for details).     [name] is a 61 y.o. male with a past medical history significant for hypertension and HIV who presents with rectal pain and swelling. Patient reports that  he has had HIV for the last 18 years and is on medications. He reports that his fiance does not of this diagnosis and requested that we do not reveal this information to her. Nursing was informed of this request. Patient reports that for the last week he has had rectal pain and swelling on the left side. He saw his primary physician 2 days ago and was started on Augmentin and doxycycline. At that time,they were deciding whether to get imaging. Patient has been treating at home with antibiotics but he reports the pain is acutely worsening and he is feeling general malaise. Patient denies fevers or chills however on arrival patient was tachycardic in the 130s. Patient denies nausea, vomiting, conservation, diarrhea,  dysuria. He denies chest pain or palpitations. He denies cough. He reports the pain is "2000 out of 10". He reports it is sharp and radiates inside. Next  On exam, rectal exam was performed and patient had fluctuance and tenderness of the entire right buttock. There is also tenderness on the left when a digital rectal exam was performed with fluctuance extending deep. No drainage was palpated or seen. Patient had abdominal tenderness. Lungs clear. Chest is nontender.  Patient will be given fluids given his tachycardia. He'll given pain medicine. He will have laboratory testing and will also receive a CT scan to determine extent of the infection.  She reports that 15 years ago he had to have one of these infections drained. Anticipate reassessment after workup.        12:12 PM Patient's diagnostic testing began to return. Patient has a mild leukocytosis of 13.1. Mild anemia. Patient's CMP is grossly reassuring. Lactic acid nonelevated.  Patient had continued severe pain requiring several doses of Dilaudid.  CT imaging revealed concern for 4.723.5 cm abscess that tracks from the left rectum/anus towards the penis and there is possibility of fistula. There is hazy infiltration of the subcutaneous fat as well as haziness of the fat planes in the ischioanal and ischiorectal fossa consistent with inflammatory spread.   Due to concern for deep spread and fistula, general surgery will be called for evaluation.   12:49 PM General surgery report that they will come see the patient. Patient's last meal was yesterday. They're requesting admission to the hospitalist service for further management of infection, Blood pressure, and HIV.  Hospitalist team will be called.   Final Clinical Impressions(s) / ED Diagnoses   Final diagnoses:  Abscess    Clinical Impression: 1. Abscess     Disposition: Admit to Hosptilist service after admission    Trell Secrist, Canary Brim, MD 01/26/17 1554

## 2017-01-26 NOTE — ED Notes (Signed)
ED Provider at bedside. 

## 2017-01-26 NOTE — Consult Note (Signed)
Reason for Consult:perirectal abscess Referring Physician: Dr Gustavus Messing  [name] is an 61 y.o. male.  HPI: 61 year old African-American male with HIV and hypertension comes the emergency room with worsening perirectal pain. He states that he had food poisoning about 2 weeks ago and had lots of diarrhea with resultant hemorrhoid problems. He started having perirectal pain a few days ago. He presented to his primary care doctor's office and was placed on oral antibiotics However his pain continued to worsen. He denies any fever, chills, dysuria. He denies any weight loss. He states he is compliant with his HIV medication. He smokes very infrequently. He states he had a remote hemorrhoidal problem about 17 years ago.  His fiance does not know his HIV status. He states that they are not intimate. He states that he has plans to tell his fiance his status.  Past Medical History:  Diagnosis Date  . HIV infection ([place])   . Hypertension     History reviewed. No pertinent surgical history.  No family history on file.  Social History:  reports that he has quit smoking. He has never used smokeless tobacco. He reports that he drinks alcohol. He reports that he uses drugs, including Marijuana, about 3 times per week.  Allergies: No Known Allergies  Medications: I have reviewed the patient's current medications.  Results for orders placed or performed during the hospital encounter of 01/26/17 (from the past 48 hour(s))  CBC with Differential     Status: Abnormal   Collection Time: 01/26/17  8:22 AM  Result Value Ref Range   WBC 13.1 (H) 4.0 - 10.5 K/uL   RBC 3.96 (L) 4.22 - 5.81 MIL/uL   Hemoglobin 12.2 (L) 13.0 - 17.0 g/dL   HCT 34.9 (L) 39.0 - 52.0 %   MCV 88.1 78.0 - 100.0 fL   MCH 30.8 26.0 - 34.0 pg   MCHC 35.0 30.0 - 36.0 g/dL   RDW 13.0 11.5 - 15.5 %   Platelets 438 (H) 150 - 400 K/uL   Neutrophils Relative % 67 %   Neutro Abs 8.8 (H) 1.7 - 7.7 K/uL   Lymphocytes Relative 25 %   Lymphs Abs 3.3 0.7 - 4.0 K/uL   Monocytes Relative 7 %   Monocytes Absolute 0.9 0.1 - 1.0 K/uL   Eosinophils Relative 1 %   Eosinophils Absolute 0.1 0.0 - 0.7 K/uL   Basophils Relative 0 %   Basophils Absolute 0.0 0.0 - 0.1 K/uL  Comprehensive metabolic panel     Status: Abnormal   Collection Time: 01/26/17  8:22 AM  Result Value Ref Range   Sodium 137 135 - 145 mmol/L   Potassium 3.9 3.5 - 5.1 mmol/L   Chloride 106 101 - 111 mmol/L   CO2 20 (L) 22 - 32 mmol/L   Glucose, Bld 122 (H) 65 - 99 mg/dL   BUN 14 6 - 20 mg/dL   Creatinine, Ser 1.22 0.61 - 1.24 mg/dL   Calcium 9.5 8.9 - 10.3 mg/dL   Total Protein 8.7 (H) 6.5 - 8.1 g/dL   Albumin 3.5 3.5 - 5.0 g/dL   AST 19 15 - 41 U/L   ALT 17 17 - 63 U/L   Alkaline Phosphatase 80 38 - 126 U/L   Total Bilirubin 0.6 0.3 - 1.2 mg/dL   GFR calc non Af Amer >60 >60 mL/min   GFR calc Af Amer >60 >60 mL/min    Comment: (NOTE) The eGFR has been calculated using the CKD EPI equation.  This calculation has not been validated in all clinical situations. eGFR's persistently <60 mL/min signify possible Chronic Kidney Disease.    Anion gap 11 5 - 15  Lactic acid, plasma     Status: None   Collection Time: 01/26/17  8:35 AM  Result Value Ref Range   Lactic Acid, Venous 1.3 0.5 - 1.9 mmol/L    Ct Abdomen Pelvis W Contrast  Result Date: 01/26/2017 CLINICAL DATA:  61 year old hypertensive male with rectal abscess. HIV. Initial encounter. EXAM: CT ABDOMEN AND PELVIS WITH CONTRAST TECHNIQUE: Multidetector CT imaging of the abdomen and pelvis was performed using the standard protocol following bolus administration of intravenous contrast. CONTRAST:  115m ISOVUE-300 IOPAMIDOL (ISOVUE-300) INJECTION 61% COMPARISON:  None. FINDINGS: Lower chest: Scarring/atelectasis lung bases.  Normal heart size. Hepatobiliary: 4.8 cm enhancing lesion posterior medial right lobe of liver (segment 6). 1.7 cm enhancing lesion mid aspect segment 6. No calcified gallstone.  Pancreas: No pancreatic mass or inflammation. Spleen: No splenic mass or enlargement. Adrenals/Urinary Tract: Subcentimeter low-density structure right adrenal gland probably a small adenoma. No left adrenal lesion. No renal obstructing stone or hydronephrosis. Bilateral renal low-density structures, larger ones which are cysts, others too small to characterize although statistically likely cysts. Stomach/Bowel: 3.5 x 2 x 4.7 cm abscess located between the left rectum/anus and left external sphincter. Prominent thickening of the left external sphincter with haziness of fat planes within the left ischialanal/ ischialrectal fossa consistent with spread of inflammatory process. There is also hazy infiltration of the subcutaneous fat of the left buttock consistent with cellulitis. With the spread of infection, possibility of fistula is raised although not well delineated. Abscess causes narrowing of the distal rectum. Perianal abscess extends towards the base of the penis. Portions of bowel/ stomach under distended and evaluation limited. Partially fluid-filled cecum. No other evidence of extraluminal bowel inflammatory process. No free intraperitoneal air. Vascular/Lymphatic: Atherosclerotic changes aorta and aortic branch vessels. No aortic aneurysm or large vessel occlusion. Shotty inguinal lymph nodes with fatty centers within normal limits. Normal size retroperitoneal lymph nodes. Reproductive: Perianal abscess extends towards the base of the penis. Tiny prostate gland calcifications. Other: No free intraperitoneal air. Musculoskeletal: No worrisome osseous lesion. IMPRESSION: Left perianal abscess with adjacent cellulitis as detailed above. 4.8 cm enhancing lesion posterior medial right lobe of liver (segment 6). 1.7 cm enhancing lesion mid aspect segment 6. It is possible these areas represent hemangiomas although incompletely assessed on present exam. Findings can be evaluated with dedicated contrast enhanced  liver MR after present acute episode has cleared. Aortic Atherosclerosis (ICD10-I70.0). Electronically Signed   By: SGenia DelM.D.   On: 01/26/2017 11:13    Review of Systems  Constitutional: Negative for chills, fever and weight loss.  HENT: Negative for nosebleeds.   Eyes: Negative for blurred vision.  Respiratory: Negative for shortness of breath.   Cardiovascular: Negative for chest pain, palpitations, orthopnea and PND.       Denies DOE  Genitourinary: Negative for dysuria and hematuria.  Musculoskeletal: Negative.   Skin: Negative for itching and rash.  Neurological: Negative for dizziness, focal weakness, seizures, loss of consciousness and headaches.       Denies TIAs, amaurosis fugax  Endo/Heme/Allergies: Does not bruise/bleed easily.  Psychiatric/Behavioral: The patient is not nervous/anxious.    Blood pressure (!) 173/114, pulse (!) 108, temperature 99.2 F (37.3 C), temperature source Oral, resp. rate (!) 26, SpO2 97 %. Physical Exam  Vitals reviewed. Constitutional: He is oriented to person, place, and time. He  appears well-developed and well-nourished. No distress.  HENT:  Head: Normocephalic and atraumatic.  Right Ear: External ear normal.  Left Ear: External ear normal.  Eyes: Conjunctivae are normal. No scleral icterus.  Neck: Normal range of motion. Neck supple. No tracheal deviation present. No thyromegaly present.  Cardiovascular: Normal rate and normal heart sounds.   Respiratory: Effort normal and breath sounds normal. No stridor. No respiratory distress. He has no wheezes.  GI: Soft. He exhibits no distension. There is no tenderness. There is no rebound.  Genitourinary:     Genitourinary Comments: Significant induration/tenderness/prob fluctuance L perirectal area extending toward perineum. Not much cellulitis. Old L buttock scar  Musculoskeletal: He exhibits no edema or tenderness.  Lymphadenopathy:    He has no cervical adenopathy.  Neurological:  He is alert and oriented to person, place, and time. He exhibits normal muscle tone.  Skin: Skin is warm and dry. No rash noted. He is not diaphoretic. No erythema. No pallor.  Psychiatric: He has a normal mood and affect. His behavior is normal. Judgment and thought content normal.    Assessment/Plan: Left perirectal abscess with possible fistula Hypertension-uncontrolled HIV positive  We discussed perirectal abscesses. We discussed how they generally occur. We discussed the possibility of fistula formation. We discussed that if he does in fact develop a fistula he will need additional procedures to correct this more than likely. We discussed several potential intraoperative findings as well as the typical recovery course as well as the typical wound care.  He needs surgical incision, drainage and debridement and possible seton placement if the fistula is demonstrated today  We discussed at length the risk and benefits of surgery including but not limited to bleeding, infection, injury to surrounding structures, need for additional procedures, urinary retention, blood clot formation, delayed wound healing, cardiac and pulmonary events, recurrence, postoperative wound issues and pain control.  He has agreed to proceed with surgery.  I did encourage the patient to share his status with his fiance. I informed the patient there are medications that the fianc can take 2 limit transition of HIV transmission to her.  Recommend medicine admit because of his uncontrolled hypertension and immunosuppresed state  Leighton Ruff. Redmond Pulling, MD, FACS General, Bariatric, & Minimally Invasive Surgery [facility], [place]   [facility] M 01/26/2017, 1:11 PM

## 2017-01-26 NOTE — Progress Notes (Signed)
Pharmacy Antibiotic Note  [name] is a 61 y.o. male admitted on 01/26/2017 with perirectal abscess.  Pharmacy has been consulted for vancomycin and zosyn dosing.  Plan: vanc 1 gm given in ED at 1357 and zosyn 3.375 given in ED at 1435 Vancomycin 750 mg IV every 12 hours.  Goal trough 15-20 mcg/mL. Zosyn 3.375g IV q8h (4 hour infusion). F/u renal function, WBC, temp, culture data vanc levels as needed    Temp (24hrs), Avg:99 F (37.2 C), Min:98.2 F (36.8 C), Max:100.1 F (37.8 C)   Recent Labs Lab 01/26/17 0822 01/26/17 0835  WBC 13.1*  --   CREATININE 1.22  --   LATICACIDVEN  --  1.3    Estimated Creatinine Clearance: 63.2 mL/min (by C-G formula based on SCr of 1.22 mg/dL).    No Known Allergies  Thank you for allowing pharmacy to be a part of this patient's care. Herby Abraham, Pharm.D. [phone] 01/26/2017 5:02 PM

## 2017-01-26 NOTE — Addendum Note (Signed)
Addendum  created 01/26/17 1655 by Wynonia Sours, CRNA   Anesthesia Intra Meds edited

## 2017-01-26 NOTE — Anesthesia Preprocedure Evaluation (Signed)
Anesthesia Evaluation  Patient identified by MRN, date of birth, ID band Patient awake    Reviewed: Allergy & Precautions, NPO status , Patient's Chart, lab work & pertinent test results  Airway Mallampati: II  TM Distance: >3 FB Neck ROM: Full    Dental no notable dental hx.    Pulmonary neg pulmonary ROS, former smoker,    Pulmonary exam normal breath sounds clear to auscultation       Cardiovascular hypertension, Pt. on medications negative cardio ROS Normal cardiovascular exam Rhythm:Regular Rate:Normal     Neuro/Psych negative neurological ROS  negative psych ROS   GI/Hepatic negative GI ROS, Neg liver ROS,   Endo/Other  negative endocrine ROS  Renal/GU negative Renal ROS  negative genitourinary   Musculoskeletal negative musculoskeletal ROS (+)   Abdominal   Peds negative pediatric ROS (+)  Hematology negative hematology ROS (+)   Anesthesia Other Findings   Reproductive/Obstetrics negative OB ROS                            Anesthesia Physical Anesthesia Plan  ASA: III and emergent  Anesthesia Plan: General   Post-op Pain Management:    Induction: Intravenous  PONV Risk Score and Plan: 2 and Ondansetron, Dexamethasone, Treatment may vary due to age or medical condition and Midazolam  Airway Management Planned: Oral ETT and LMA  Additional Equipment:   Intra-op Plan:   Post-operative Plan: Extubation in OR  Informed Consent:   Plan Discussed with:   Anesthesia Plan Comments: (  )        Anesthesia Quick Evaluation

## 2017-01-26 NOTE — Progress Notes (Signed)
Upper lip increased in size. Lungs clear.  No wheezing.  Able to swallow tablets.  No c/o of pain.  Dr. Robb Matar notified. Received orders.

## 2017-01-26 NOTE — H&P (Addendum)
History and Physical    [name]:[id number] DOB: 1955/04/20 DOA: 01/26/2017  PCP: Judyann Munson, MD  Patient coming from: home  I have personally briefly reviewed patient's old medical records in [facility]  Chief Complaint: perirectal abscess  HPI: [name] is [name] 61 y.o. male with medical history significant of HIV and HTN presenting with perirectal abscess.   Patient was [name] few weeks ago he had food poisoning with diarrhea. After that he started noticing pain in the bottom. He notes pain since then for [name] few weeks. He notes that is worse with movement and better with ice. He denies any fevers, chills, nausea, vomiting, diarrhea, dysuria, rash.  On Tuesday he presented to Dr. Chaya Jan office he was started on Augmentin and doxycycline for concern for infection.  He presented to the ED today due to persistent pain.  He had HIV since early 2000. He is compliant with his regimen. His most recent HIV RNA was undetectable 12/2016.  CD4 count 9/27 720.   ED Course: Labs, imaging, surgery c/s, abx.   Review of Systems: As per HPI otherwise 10 point review of systems negative.   Past Medical History:  Diagnosis Date  . HIV infection (HCC)   . Hypertension     History reviewed. No pertinent surgical history.   reports that he has quit smoking. He has never used smokeless tobacco. He reports that he drinks alcohol. He reports that he uses drugs, including Marijuana, about 3 times per week.  No Known Allergies  Family History  Problem Relation Age of Onset  . Diabetes Mother   . Hypertension Mother    Prior to Admission medications   Medication Sig Start Date End Date Taking? Authorizing Provider  amLODipine (NORVASC) 10 MG tablet Take 1 tablet (10 mg total) by mouth daily. 10/10/16  Yes Kuppelweiser, Cassie L, RPH-CPP  amoxicillin-clavulanate (AUGMENTIN) 875-125 MG tablet Take 1 tablet by mouth 2 (two) times daily. Patient taking differently: Take 1 tablet by  mouth 2 (two) times daily. Started 10/10 for 10 days 01/24/17  Yes Judyann Munson, MD  CRESTOR 10 MG tablet Take 10 mg by mouth at bedtime.  12/28/16  Yes [provider]  dolutegravir (TIVICAY) 50 MG tablet Take 1 tablet (50 mg total) by mouth daily. 08/16/16  Yes Judyann Munson, MD  doxycycline (VIBRA-TABS) 100 MG tablet Take 1 tablet (100 mg total) by mouth 2 (two) times daily. Patient taking differently: Take 100 mg by mouth 2 (two) times daily. Started 10/10 for 10 days 01/24/17  Yes Judyann Munson, MD  emtricitabine-tenofovir AF (DESCOVY) 200-25 MG tablet Take 1 tablet by mouth daily. 08/16/16  Yes Judyann Munson, MD  hydrochlorothiazide (HYDRODIURIL) 25 MG tablet Take 1 tablet (25 mg total) by mouth daily. 10/10/16  Yes Kuppelweiser, Cassie L, RPH-CPP  Ibuprofen-Diphenhydramine Cit (ADVIL PM) 200-38 MG TABS Take 2 tablets by mouth at bedtime as needed (for pain/sleep).   Yes [provider]  lisinopril (PRINIVIL,ZESTRIL) 40 MG tablet Take 1 tablet (40 mg total) by mouth daily. 10/10/16  Yes Kuppelweiser, Cassie L, RPH-CPP  valACYclovir (VALTREX) 500 MG tablet Take 1 tablet (500 mg total) by mouth daily. 08/11/15  Yes Judyann Munson, MD  hydrocortisone cream 1 % Apply 1 application topically 2 (two) times daily. Patient not taking: Reported on 01/26/2017 10/24/16   Judyann Munson, MD  megestrol (MEGACE) 400 MG/10ML suspension Take 10 mLs (400 mg total) by mouth daily. Patient not taking: Reported on 01/26/2017 01/24/17   Drue Second,  Aram Beecham, MD    Physical Exam: Vitals:   01/26/17 1645 01/26/17 1700 01/26/17 1800 01/26/17 2000  BP: (!) 164/97 (!) 155/92 (!) 148/88 122/75  Pulse: 87 87 78 91  Resp: Temp: 99.1 F (37.3 C) 99 F (37.2 C) 99 F (37.2 C) 99.1 F (37.3 C)  TempSrc:   Oral Oral  SpO2: 100% 100% 99% 100%    Constitutional: NAD, calm, comfortable Vitals:   01/26/17 1645 01/26/17 1700 01/26/17 1800 01/26/17 2000  BP: (!) 164/97 (!) 155/92 (!) 148/88  122/75  Pulse: 87 87 78 91  Resp: Temp: 99.1 F (37.3 C) 99 F (37.2 C) 99 F (37.2 C) 99.1 F (37.3 C)  TempSrc:   Oral Oral  SpO2: 100% 100% 99% 100%   Eyes: PERRL, lids and conjunctivae normal ENMT: Mucous membranes are moist. Posterior pharynx clear of any exudate or lesions.Normal dentition.  Neck: normal, supple, no masses, no thyromegaly Respiratory: clear to auscultation bilaterally, no wheezing, no crackles. Normal respiratory effort. No accessory muscle use.  Cardiovascular: Regular rate and rhythm, no murmurs / rubs / gallops. No extremity edema. 2+ pedal pulses. No carotid bruits.  Abdomen: no tenderness, no masses palpated. No hepatosplenomegaly. Bowel sounds positive.  Buttocks: moves gingerly.  L sided tendenesss.  Musculoskeletal: no clubbing / cyanosis. No joint deformity upper and lower extremities. Good ROM, no contractures. Normal muscle tone.  Skin: no rashes, lesions, ulcers. No induration Neurologic: CN 2-12 grossly intact. Sensation intact, DTR normal. Strength 5/5 in all 4.  Psychiatric: Normal judgment and insight. Alert and oriented x 3. Normal mood.   Labs on Admission: I have personally reviewed following labs and imaging studies  CBC:  Recent Labs Lab 01/26/17 0822  WBC 13.1*  NEUTROABS 8.8*  HGB 12.2*  HCT 34.9*  MCV 88.1  PLT 438*   Basic Metabolic Panel:  Recent Labs Lab 01/26/17 0822  NA 137  K 3.9  CL 106  CO2 20*  GLUCOSE 122*  BUN 14  CREATININE 1.22  CALCIUM 9.5   GFR: Estimated Creatinine Clearance: 63.2 mL/min (by C-G formula based on SCr of 1.22 mg/dL). Liver Function Tests:  Recent Labs Lab 01/26/17 0822  AST 19  ALT 17  ALKPHOS 80  BILITOT 0.6  PROT 8.7*  ALBUMIN 3.5   No results for input(s): LIPASE, AMYLASE in the last 168 hours. No results for input(s): AMMONIA in the last 168 hours. Coagulation Profile: No results for input(s): INR, PROTIME in the last 168 hours. Cardiac Enzymes: No  results for input(s): CKTOTAL, CKMB, CKMBINDEX, TROPONINI in the last 168 hours. BNP (last 3 results) No results for input(s): PROBNP in the last 8760 hours. HbA1C: No results for input(s): HGBA1C in the last 72 hours. CBG: No results for input(s): GLUCAP in the last 168 hours. Lipid Profile: No results for input(s): CHOL, HDL, LDLCALC, TRIG, CHOLHDL, LDLDIRECT in the last 72 hours. Thyroid Function Tests: No results for input(s): TSH, T4TOTAL, FREET4, T3FREE, THYROIDAB in the last 72 hours. Anemia Panel: No results for input(s): VITAMINB12, FOLATE, FERRITIN, TIBC, IRON, RETICCTPCT in the last 72 hours. Urine analysis:    Component Value Date/Time   COLORURINE DARK YELLOW 12/16/2014 1500   APPEARANCEUR CLEAR 12/16/2014 1500   LABSPEC 1.027 12/16/2014 1500   PHURINE 5.0 12/16/2014 1500   GLUCOSEU NEGATIVE 12/16/2014 1500   HGBUR NEGATIVE 12/16/2014 1500   BILIRUBINUR NEGATIVE 12/16/2014 1500   KETONESUR TRACE (Halsey Persaud) 12/16/2014 1500   PROTEINUR  NEGATIVE 12/16/2014 1500   UROBILINOGEN 0.2 07/18/2013 1004   NITRITE NEGATIVE 12/16/2014 1500   LEUKOCYTESUR 2+ (Tekia Waterbury) 12/16/2014 1500    Radiological Exams on Admission: Ct Abdomen Pelvis W Contrast  Result Date: 01/26/2017 CLINICAL DATA:  61 year old hypertensive male with rectal abscess. HIV. Initial encounter. EXAM: CT ABDOMEN AND PELVIS WITH CONTRAST TECHNIQUE: Multidetector CT imaging of the abdomen and pelvis was performed using the standard protocol following bolus administration of intravenous contrast. CONTRAST:  ISOVUE-300 IOPAMIDOL (ISOVUE-300) INJECTION 61% COMPARISON:  None. FINDINGS: Lower chest: Scarring/atelectasis lung bases.  Normal heart size. Hepatobiliary: 4.8 cm enhancing lesion posterior medial right lobe of liver (segment 6). 1.7 cm enhancing lesion mid aspect segment 6. No calcified gallstone. Pancreas: No pancreatic mass or inflammation. Spleen: No splenic mass or enlargement. Adrenals/Urinary Tract: Subcentimeter  low-density structure right adrenal gland probably Beckam Abdulaziz small adenoma. No left adrenal lesion. No renal obstructing stone or hydronephrosis. Bilateral renal low-density structures, larger ones which are cysts, others too small to characterize although statistically likely cysts. Stomach/Bowel: 3.5 x 2 x 4.7 cm abscess located between the left rectum/anus and left external sphincter. Prominent thickening of the left external sphincter with haziness of fat planes within the left ischialanal/ ischialrectal fossa consistent with spread of inflammatory process. There is also hazy infiltration of the subcutaneous fat of the left buttock consistent with cellulitis. With the spread of infection, possibility of fistula is raised although not well delineated. Abscess causes narrowing of the distal rectum. Perianal abscess extends towards the base of the penis. Portions of bowel/ stomach under distended and evaluation limited. Partially fluid-filled cecum. No other evidence of extraluminal bowel inflammatory process. No free intraperitoneal air. Vascular/Lymphatic: Atherosclerotic changes aorta and aortic branch vessels. No aortic aneurysm or large vessel occlusion. Shotty inguinal lymph nodes with fatty centers within normal limits. Normal size retroperitoneal lymph nodes. Reproductive: Perianal abscess extends towards the base of the penis. Tiny prostate gland calcifications. Other: No free intraperitoneal air. Musculoskeletal: No worrisome osseous lesion. IMPRESSION: Left perianal abscess with adjacent cellulitis as detailed above. 4.8 cm enhancing lesion posterior medial right lobe of liver (segment 6). 1.7 cm enhancing lesion mid aspect segment 6. It is possible these areas represent hemangiomas although incompletely assessed on present exam. Findings can be evaluated with dedicated contrast enhanced liver MR after present acute episode has cleared. Aortic Atherosclerosis (ICD10-I70.0). Electronically Signed   By: Lacy Duverney M.D.   On: 01/26/2017 11:13    EKG: Independently reviewed. Sinus tach, no priors for comparison  Assessment/Plan Active Problems:   Perirectal abscess  Perirectal abscess:  TR[facility] requested to admit due to uncontrolled HTN in ED and HIV.   CT with L perianal abscess with adjacent cellulitis S/p vanc/zosyn Bcx pending (UA/cx were ordered, but asx from this standpoint.  Have not yet been collected) Surgical cx pending, narrow abx as indicated  SIRS: tachy, leukocytosis, 2/2 above.  Tachycardia improved, likely related to pain.  F/u cx as above.   HIV Positive:  September with undectectable viral load.  CD4 count 700s.  He has not told his partner about his HIV positive status.  Has been encouraged to share this information with his partner.  Continue descovy/tivicay Continue valtrex  HTN: elevated in ED, likely due to pain, improved now.   Continue amlodipine/HCTZ/lisinopril  Liver lesions: 4.7 cm and 1.7 cm enhancing lesions.  Possible hemangiomas?  Rec f/u contrasted liver MR after acute episode.   Crestor for HLD Holding megace for appetite stim  DVT prophylaxis: heparin  Code Status: full  Family Communication: none  Disposition Plan: pending improvement  Consults called: surgery  Admission status: inpatient    Lacretia Nicks MD [facility] Pager [phone]  If 7PM-7AM, please contact night-coverage [email or web address] Password [facility]  01/26/2017, 9:37 PM

## 2017-01-26 NOTE — ED Triage Notes (Signed)
Pt presents with request for pain management for rectal abscess.

## 2017-01-26 NOTE — Treatment Plan (Signed)
NIGHT FLOOR COVERAGE  The patient is experiencing lip swelling while Zosyn is being infused. No dsyphagia or dyspnea reported. He is also on lisinopril 40 mg po daily.   Plan: Hold Zosyn and start aztreonam. Hold Lisinopril as well.  Benadryl 25 mg IVP given. Famotidine 20 mg IVP x1, then 20 mg po BID.  Sanda Klein, M.D.

## 2017-01-26 NOTE — Op Note (Signed)
NAMEJANAI, BRANNIGAN                ACCOUNT NO.:  [id number]  MEDICAL RECORD NO.:  [id number]  LOCATION:  ED                           FACILITY:  [facility]  PHYSICIAN:  [name], MD, FACSDATE OF BIRTH:  Sep 25, 1955  DATE OF PROCEDURE:  01/26/2017 DATE OF DISCHARGE:                              OPERATIVE REPORT   PREOPERATIVE DIAGNOSIS:  Left perirectal abscess.  POSTOPERATIVE DIAGNOSIS:  Left perirectal abscess.  PROCEDURE:  Incision and drainage of left perirectal abscess internally.  SURGEON:  [name], MD, FACS.  ASSISTANT SURGEON:  None.  ANESTHESIA:  General.  EBL:  Around 25 mL.  SPECIMEN:  Aerobic and anaerobic cultures sent to Micro.  INDICATIONS FOR PROCEDURE:  The patient is a 61 year old gentleman with HIV, who had food poisoning a few weeks ago with diarrhea, who subsequently started having perirectal discomfort about 5 days ago.  He presented to his PCP and was placed on oral antibiotics.  His pain worsened and he came to the emergency room.  CT scan demonstrated a 4.7 x 3.5 x 2 cm abscess in the left rectum, anus and left external sphincter with concerns for potential fistula formation tracking toward the skin.  On exam, he had significant induration and tenderness along the left perirectal location extending toward the perineum.  I recommended exam under anesthesia with incision and drainage.  Please see chart for details regarding that discussion.  DESCRIPTION OF PROCEDURE:  The patient was taken to the OR 2 at [facility], placed supine on the OR table.  Sequential compression devices were placed.  General endotracheal anesthesia was established. He was placed in lithotomy position with the appropriate padding.  His buttocks and perineum were prepped with Betadine.  He received vancomycin and Zosyn prior to skin incision.  A surgical time-out was performed.  He had induration measuring 5 cm x 3 cm in the left perirectal location extending  toward the perineum in the left anterior location.  Digital rectal exam was performed and upon placing the finger in the rectum, there was large expression of purulent fluid that came from the rectum.  Anoscopy was then performed, which demonstrated spontaneously draining purulent fluid from the left position within the anal canal.  This was approximately 5 cm internally from the anal verge. I opened up the area little bit more to allow internal drainage.  I did make a small cruciate incision in the left perirectal skin.  There was minimal drainage.  There was a little bit of a cavity in this location. I injected hydrogen peroxide into the left perianal wound to see if there was any connection internally and there was no drainage of hydrogen peroxide within the rectum.  At this point, I placed 0.25-inch iodoform gauze into the internal abscess cavity leaving a tail of iodoform strip coming out of the rectum to be removed tomorrow.  I also placed Gelfoam gauze within the rectum for hemostasis and iodoform was also placed into the left perirectal wound.  We then placed fluffs and mesh underwear.  The patient was placed in supine position, extubated and taken to the recovery room in stable condition. All needle, instruments and sponge counts  were correct x2.  There were no immediate complications.  The patient tolerated the procedure well.    Tool used for debridement (curette, scapel, etc.)  SCAPEL   Frequency of surgical debridement.   INITIAL   Area and depth of devitalized tissue removed from wound.  2 X 2 CM    Blood loss and description of tissue removed.  25 CC; MUCOSA AND SOFT TISSUE  [name], MD, FACS     EMW/MEDQ  D:  01/26/2017  T:  01/26/2017  Job:  [id number]

## 2017-01-26 NOTE — Anesthesia Postprocedure Evaluation (Signed)
Anesthesia Post Note  Patient: [name]  Procedure(s) Performed: IRRIGATION AND DEBRIDEMENT PERIRECTAL ABSCESS (N/A Rectum)     Patient location during evaluation: PACU Anesthesia Type: General Level of consciousness: awake and alert Pain management: pain level controlled Vital Signs Assessment: post-procedure vital signs reviewed and stable Respiratory status: spontaneous breathing, nonlabored ventilation, respiratory function stable and patient connected to nasal cannula oxygen Cardiovascular status: blood pressure returned to baseline and stable Postop Assessment: no apparent nausea or vomiting Anesthetic complications: no    Last Vitals:  Vitals:   01/26/17 1239 01/26/17 1538  BP:  (!) 168/101  Pulse:  (!) 106  Resp:    Temp: 37.3 C 37.1 C  SpO2:  100%    Last Pain:  Vitals:   01/26/17 1239  TempSrc: Oral  PainSc:                  Donelle Hise

## 2017-01-26 NOTE — Brief Op Note (Signed)
01/26/2017  3:39 PM  PATIENT:  [name]  61 y.o. male  PRE-OPERATIVE DIAGNOSIS:  perirectal abcess  POST-OPERATIVE DIAGNOSIS:  perirectal abcess  PROCEDURE:  Procedure(s): INCISION AND DRAINAGE OF LEFT PERIRECTAL ABSCESS (N/A)  SURGEON:  Surgeon(s) and Role:    Gaynelle Adu, MD - Primary  PHYSICIAN ASSISTANT:   ASSISTANTS: none    Tool used for debridement (curette, scapel, etc.)  SCAPEL   Frequency of surgical debridement.   INITIAL   Area and depth of devitalized tissue removed from wound.  2 X 2 CM    Blood loss and description of tissue removed.  25 CC; MUCOSA AND SOFT TISSUE   ANESTHESIA:   general  EBL:  Total I/O In: 1000 [IV Piggyback:1000] Out: -   BLOOD ADMINISTERED:none  DRAINS: none   LOCAL MEDICATIONS USED:  NONE  SPECIMEN:  Source of Specimen:  perirectal abscess  DISPOSITION OF SPECIMEN:  micro  COUNTS:  YES  TOURNIQUET:  * No tourniquets in log *  DICTATION: .Other Dictation: Dictation Number [id number]  PLAN OF CARE: Admit for overnight observation  PATIENT DISPOSITION:  PACU - hemodynamically stable.   Delay start of Pharmacological VTE agent (>24hrs) due to surgical blood loss or risk of bleeding: no  [name], MD, FACS General, Bariatric, & Minimally Invasive Surgery [facility], [place]

## 2017-01-26 NOTE — Anesthesia Procedure Notes (Addendum)
Procedure Name: Intubation Date/Time: 01/26/2017 2:28 PM Performed by: West Pugh Pre-anesthesia Checklist: Patient identified, Emergency Drugs available, Suction available, Patient being monitored and Timeout performed Patient Re-evaluated:Patient Re-evaluated prior to induction Oxygen Delivery Method: Circle system utilized Preoxygenation: Pre-oxygenation with 100% oxygen Induction Type: IV induction Ventilation: Mask ventilation without difficulty Laryngoscope Size: 3 and Miller Grade View: Grade II Tube type: Oral Tube size: 7.5 mm Number of attempts: 2 Airway Equipment and Method: Stylet Placement Confirmation: ETT inserted through vocal cords under direct vision,  positive ETCO2,  CO2 detector and breath sounds checked- equal and bilateral Secured at: 22 cm Tube secured with: Tape Dental Injury: Teeth and Oropharynx as per pre-operative assessment  Comments: Attempt times 2, 1st attempt with MAC 4, Grade 3 unable to easily pass tube due to dentition. Miller 3 utilized with grade 1 view with some difficulty passing tube passed poor dentition.

## 2017-01-27 ENCOUNTER — Encounter (HOSPITAL_COMMUNITY): Payer: Self-pay | Admitting: General Surgery

## 2017-01-27 DIAGNOSIS — I10 Essential (primary) hypertension: Secondary | ICD-10-CM

## 2017-01-27 DIAGNOSIS — L0291 Cutaneous abscess, unspecified: Secondary | ICD-10-CM

## 2017-01-27 DIAGNOSIS — K611 Rectal abscess: Secondary | ICD-10-CM

## 2017-01-27 DIAGNOSIS — B20 Human immunodeficiency virus [HIV] disease: Secondary | ICD-10-CM

## 2017-01-27 LAB — CBC
HCT: 31.5 % — ABNORMAL LOW (ref 39.0–52.0)
Hemoglobin: 10.7 g/dL — ABNORMAL LOW (ref 13.0–17.0)
MCH: 30.1 pg (ref 26.0–34.0)
MCHC: 34 g/dL (ref 30.0–36.0)
MCV: 88.7 fL (ref 78.0–100.0)
Platelets: 426 10*3/uL — ABNORMAL HIGH (ref 150–400)
RBC: 3.55 MIL/uL — AB (ref 4.22–5.81)
RDW: 13.1 % (ref 11.5–15.5)
WBC: 16 10*3/uL — AB (ref 4.0–10.5)

## 2017-01-27 LAB — BASIC METABOLIC PANEL
ANION GAP: 11 (ref 5–15)
BUN: 14 mg/dL (ref 6–20)
CO2: 20 mmol/L — ABNORMAL LOW (ref 22–32)
CREATININE: 1.28 mg/dL — AB (ref 0.61–1.24)
Calcium: 9 mg/dL (ref 8.9–10.3)
Chloride: 104 mmol/L (ref 101–111)
GFR calc non Af Amer: 59 mL/min — ABNORMAL LOW (ref >60)
Glucose, Bld: 221 mg/dL — ABNORMAL HIGH (ref 65–99)
Potassium: 4 mmol/L (ref 3.5–5.1)
SODIUM: 135 mmol/L (ref 135–145)

## 2017-01-27 MED ORDER — METHYLPREDNISOLONE SODIUM SUCC 40 MG IJ SOLR
40.0000 mg | Freq: Two times a day (BID) | INTRAMUSCULAR | Status: DC
  Administered 2017-01-27 – 2017-01-28 (×2): 40 mg via INTRAVENOUS
  Filled 2017-01-27 (×2): qty 1

## 2017-01-27 MED ORDER — METHYLPREDNISOLONE SODIUM SUCC 125 MG IJ SOLR
125.0000 mg | Freq: Once | INTRAMUSCULAR | Status: AC
  Administered 2017-01-27: 125 mg via INTRAVENOUS
  Filled 2017-01-27: qty 2

## 2017-01-27 MED ORDER — DIPHENHYDRAMINE HCL 50 MG/ML IJ SOLN
25.0000 mg | Freq: Once | INTRAMUSCULAR | Status: AC
  Administered 2017-01-27: 25 mg via INTRAVENOUS
  Filled 2017-01-27: qty 1

## 2017-01-27 NOTE — Progress Notes (Signed)
PROGRESS NOTE    [name]  ZOX:[id number] DOB: 1955-09-17 DOA: 01/26/2017 PCP: Judyann Munson, MD    Brief Narrative:  61 y.o. male with medical history significant of HIV and HTN presenting with perirectal abscess.   Patient was a few weeks ago he had food poisoning with diarrhea. After that he started noticing pain in the bottom. He notes pain since then for a few weeks. He notes that is worse with movement and better with ice. He denies any fevers, chills, nausea, vomiting, diarrhea, dysuria, rash.  On Tuesday he presented to Dr. Chaya Jan office he was started on Augmentin and doxycycline for concern for infection.  He presented to the ED today due to persistent pain.  Assessment & Plan:   Principal Problem:   Perirectal abscess Active Problems:   HIV (human immunodeficiency virus infection) (HCC)   Essential hypertension  Perirectal abscess:  TR[facility] requested to admit due to uncontrolled HTN in ED and HIV.   CT with L perianal abscess with adjacent cellulitis Pt was started on vanc/zosyn Bcx ordered, pending (UA/cx were ordered, but asx from this standpoint.  Have not yet been collected) Surgery consulted and patient underwent debridement  SIRS: Presented with tachycardia, leukocytosis, 2/2 above.  Tachycardia improved, likely related to pain.  F/u cx as above.   HIV Positive:  September with undectectable viral load.  CD4 count 700s.  He has not told his partner about his HIV positive status.  Has been encouraged to share this information with his partner.  Continued descovy/tivicay Continue valtrex as tolerated  HTN: elevated in ED, likely due to pain, improved now.   Initially continued on amlodipine/HCTZ/lisinopril -given concerns for anaphylaxis, lisinopril held  Liver lesions: 4.7 cm and 1.7 cm enhancing lesions.  Possible hemangiomas?  Will plan non-emergent recommended f/u contrasted liver MR after acute episode resolved  Anaphylaxis -Facial/lip swelling  noted shortly after starting zosyn -Improved with IV steroids, famotidine, stopping zosyn and lisinopril  DVT prophylaxis: Heparin subQ Code Status: Full Family Communication: Pt in room, family not at bedside Disposition Plan: Uncertain at this time  Consultants:   General Surgery  Procedures:   Wound debridement 10/11  Antimicrobials: Anti-infectives    Start     Dose/Rate Route Frequency Ordered Stop   01/27/17 0600  vancomycin (VANCOCIN) IVPB 750 mg/150 ml premix     750 mg 150 mL/hr over 60 Minutes Intravenous Every 12 hours 01/26/17 1706     01/26/17 2330  aztreonam (AZACTAM) 1 g in dextrose 5 % 50 mL IVPB     1 g 100 mL/hr over 30 Minutes Intravenous Every 8 hours 01/26/17 2323     01/26/17 2315  aztreonam (AZACTAM) injection 1 g  Status:  Discontinued     1 g Intramuscular Every 8 hours 01/26/17 2314 01/26/17 2323   01/26/17 2000  piperacillin-tazobactam (ZOSYN) IVPB 3.375 g  Status:  Discontinued     3.375 g 12.5 mL/hr over 240 Minutes Intravenous Every 8 hours 01/26/17 1706 01/26/17 2325   01/26/17 1700  dolutegravir (TIVICAY) tablet 50 mg     50 mg Oral Daily 01/26/17 1648     01/26/17 1700  emtricitabine-tenofovir AF (DESCOVY) 200-25 MG per tablet 1 tablet     1 tablet Oral Daily 01/26/17 1648     01/26/17 1700  valACYclovir (VALTREX) tablet 500 mg     500 mg Oral Daily 01/26/17 1648     01/26/17 1337  piperacillin-tazobactam (ZOSYN) 3.375 GM/50ML IVPB    Comments:  Frazier Richards,  Karen   : cabinet override      01/26/17 1337 01/26/17 1435   01/26/17 1315  piperacillin-tazobactam (ZOSYN) IVPB 3.375 g  Status:  Discontinued     3.375 g 12.5 mL/hr over 240 Minutes Intravenous STAT 01/26/17 1306 01/26/17 1307   01/26/17 1315  piperacillin-tazobactam (ZOSYN) IVPB 3.375 g     3.375 g 100 mL/hr over 30 Minutes Intravenous STAT 01/26/17 1307 01/26/17 1435   01/26/17 1315  vancomycin (VANCOCIN) IVPB 1000 mg/200 mL premix     1,000 mg 200 mL/hr over 60 Minutes Intravenous   Once 01/26/17 1309 01/26/17 1457       Subjective: Reports feeling better this AM  Objective: Vitals:   01/27/17 0146 01/27/17 0500 01/27/17 1001 01/27/17 1407  BP: 101/65 129/79 (!) 149/84 (!) 158/84  Pulse: 84 85 (!) 107 (!) 115  Resp: Temp: 99.1 F (37.3 C) 98.1 F (36.7 C) 99 F (37.2 C) 98.8 F (37.1 C)  TempSrc: Oral Oral Oral Oral  SpO2: 100% 100% 99% 100%    Intake/Output Summary (Last 24 hours) at 01/27/17 1538 Last data filed at 01/27/17 1400  Gross per 24 hour  Intake             2250 ml  Output              800 ml  Net             1450 ml   There were no vitals filed for this visit.  Examination:  General exam: Appears calm and comfortable  Respiratory system: Clear to auscultation. Respiratory effort normal. Cardiovascular system: S1 & S2 heard, RRR Gastrointestinal system: Abdomen is nondistended, soft and nontender. No organomegaly or masses felt. Normal bowel sounds heard. Central nervous system: Alert and oriented. No focal neurological deficits. Extremities: Symmetric 5 x 5 power. Skin: No rashes, upper lip with continued swelling Psychiatry: Judgement and insight appear normal. Mood & affect appropriate.   Data Reviewed: I have personally reviewed following labs and imaging studies  CBC:  Recent Labs Lab 01/26/17 0822 01/27/17 0539  WBC 13.1* 16.0*  NEUTROABS 8.8*  --   HGB 12.2* 10.7*  HCT 34.9* 31.5*  MCV 88.1 88.7  PLT 438* 426*   Basic Metabolic Panel:  Recent Labs Lab 01/26/17 0822 01/27/17 0539  NA 137 135  K 3.9 4.0  CL 106 104  CO2 20* 20*  GLUCOSE 122* 221*  BUN 14 14  CREATININE 1.22 1.28*  CALCIUM 9.5 9.0   GFR: Estimated Creatinine Clearance: 60.2 mL/min (A) (by C-G formula based on SCr of 1.28 mg/dL (H)). Liver Function Tests:  Recent Labs Lab 01/26/17 0822  AST 19  ALT 17  ALKPHOS 80  BILITOT 0.6  PROT 8.7*  ALBUMIN 3.5   No results for input(s): LIPASE, AMYLASE in the last 168  hours. No results for input(s): AMMONIA in the last 168 hours. Coagulation Profile: No results for input(s): INR, PROTIME in the last 168 hours. Cardiac Enzymes: No results for input(s): CKTOTAL, CKMB, CKMBINDEX, TROPONINI in the last 168 hours. BNP (last 3 results) No results for input(s): PROBNP in the last 8760 hours. HbA1C: No results for input(s): HGBA1C in the last 72 hours. CBG: No results for input(s): GLUCAP in the last 168 hours. Lipid Profile: No results for input(s): CHOL, HDL, LDLCALC, TRIG, CHOLHDL, LDLDIRECT in the last 72 hours. Thyroid Function Tests: No results for input(s): TSH, T4TOTAL, FREET4, T3FREE, THYROIDAB in the last 72 hours.  Anemia Panel: No results for input(s): VITAMINB12, FOLATE, FERRITIN, TIBC, IRON, RETICCTPCT in the last 72 hours. Sepsis Labs:  Recent Labs Lab 01/26/17 0835  LATICACIDVEN 1.3    Recent Results (from the past 240 hour(s))  Blood culture (routine x 2)     Status: None (Preliminary result)   Collection Time: 01/26/17  8:50 AM  Result Value Ref Range Status   Specimen Description BLOOD RIGHT FOREARM  Final   Special Requests   Final    BOTTLES DRAWN AEROBIC AND ANAEROBIC Blood Culture adequate volume   Culture   Final    NO GROWTH 1 DAY Performed at [facility], [address], [place], [place] [address]    Report Status PENDING  Incomplete  Blood culture (routine x 2)     Status: None (Preliminary result)   Collection Time: 01/26/17  9:04 AM  Result Value Ref Range Status   Specimen Description BLOOD RIGHT HAND  Final   Special Requests   Final    BOTTLES DRAWN AEROBIC AND ANAEROBIC Blood Culture results may not be optimal due to an inadequate volume of blood received in culture bottles   Culture   Final    NO GROWTH 1 DAY Performed at [facility], [address], [place], [place] [address]    Report Status PENDING  Incomplete  Aerobic/Anaerobic Culture (surgical/deep wound)     Status: None (Preliminary  result)   Collection Time: 01/26/17  3:11 PM  Result Value Ref Range Status   Specimen Description ABSCESS PERIRECTAL  Final   Special Requests NONE  Final   Gram Stain   Final    ABUNDANT WBC PRESENT, PREDOMINANTLY PMN MODERATE GRAM NEGATIVE RODS    Culture   Final    CULTURE REINCUBATED FOR BETTER GROWTH Performed at [facility], [address], [place], [place] [address]    Report Status PENDING  Incomplete     Radiology Studies: Ct Abdomen Pelvis W Contrast  Result Date: 01/26/2017 CLINICAL DATA:  61 year old hypertensive male with rectal abscess. HIV. Initial encounter. EXAM: CT ABDOMEN AND PELVIS WITH CONTRAST TECHNIQUE: Multidetector CT imaging of the abdomen and pelvis was performed using the standard protocol following bolus administration of intravenous contrast. CONTRAST:  ISOVUE-300 IOPAMIDOL (ISOVUE-300) INJECTION 61% COMPARISON:  None. FINDINGS: Lower chest: Scarring/atelectasis lung bases.  Normal heart size. Hepatobiliary: 4.8 cm enhancing lesion posterior medial right lobe of liver (segment 6). 1.7 cm enhancing lesion mid aspect segment 6. No calcified gallstone. Pancreas: No pancreatic mass or inflammation. Spleen: No splenic mass or enlargement. Adrenals/Urinary Tract: Subcentimeter low-density structure right adrenal gland probably a small adenoma. No left adrenal lesion. No renal obstructing stone or hydronephrosis. Bilateral renal low-density structures, larger ones which are cysts, others too small to characterize although statistically likely cysts. Stomach/Bowel: 3.5 x 2 x 4.7 cm abscess located between the left rectum/anus and left external sphincter. Prominent thickening of the left external sphincter with haziness of fat planes within the left ischialanal/ ischialrectal fossa consistent with spread of inflammatory process. There is also hazy infiltration of the subcutaneous fat of the left buttock consistent with cellulitis. With the spread of infection,  possibility of fistula is raised although not well delineated. Abscess causes narrowing of the distal rectum. Perianal abscess extends towards the base of the penis. Portions of bowel/ stomach under distended and evaluation limited. Partially fluid-filled cecum. No other evidence of extraluminal bowel inflammatory process. No free intraperitoneal air. Vascular/Lymphatic: Atherosclerotic changes aorta and aortic  branch vessels. No aortic aneurysm or large vessel occlusion. Shotty inguinal lymph nodes with fatty centers within normal limits. Normal size retroperitoneal lymph nodes. Reproductive: Perianal abscess extends towards the base of the penis. Tiny prostate gland calcifications. Other: No free intraperitoneal air. Musculoskeletal: No worrisome osseous lesion. IMPRESSION: Left perianal abscess with adjacent cellulitis as detailed above. 4.8 cm enhancing lesion posterior medial right lobe of liver (segment 6). 1.7 cm enhancing lesion mid aspect segment 6. It is possible these areas represent hemangiomas although incompletely assessed on present exam. Findings can be evaluated with dedicated contrast enhanced liver MR after present acute episode has cleared. Aortic Atherosclerosis (ICD10-I70.0). Electronically Signed   By: Lacy Duverney M.D.   On: 01/26/2017 11:13    Scheduled Meds: . acetaminophen  1,000 mg Oral Q6H  . amLODipine  10 mg Oral Daily  . docusate sodium  100 mg Oral BID  . dolutegravir  50 mg Oral Daily  . emtricitabine-tenofovir AF  1 tablet Oral Daily  . famotidine  20 mg Oral BID  . gabapentin  300 mg Oral BID  . heparin  5,000 Units Subcutaneous Q8H  . hydrochlorothiazide  25 mg Oral Daily  . methylPREDNISolone (SOLU-MEDROL) injection  40 mg Intravenous Q12H  . pantoprazole  40 mg Oral Daily  . rosuvastatin  10 mg Oral QHS  . valACYclovir  500 mg Oral Daily   Continuous Infusions: . sodium chloride    . 0.9 % NaCl with KCl 20 mEq / L 50 mL/hr (01/27/17 1017)  . aztreonam 1 g  (01/27/17 1432)  . vancomycin Stopped (01/27/17 0618)     LOS: 1 day   Mcihael Hinderman, Scheryl Marten, MD [facility] Pager [phone]  If 7PM-7AM, please contact night-coverage [email or web address] Password [facility] 01/27/2017, 3:38 PM

## 2017-01-27 NOTE — Progress Notes (Signed)
[facility]/Trauma Progress Note  1 Day Post-Op   Assessment/Plan HIV HTN Swelling of face - per medicine  L Perirectal abscess  - S/P Incision and drainage of left perirectal abscess internally, Dr. Andrey Campanile, 10/11 - packing removed - sitz bath  FEN: reg diet VTE: SCD's, heparin ID: cultures pending, Vanc & aztreonam 10/11>> Foley: none Follow up: [facility] clinic in 2 weeks  DISPO: Packing removed. Pt can shower. Sitz baths BID and after BM's. Cultures pending. Continue IV abx.     LOS: 1 day    Subjective:  CC: perirectal abscess  Pt states mild pain. Had a BM this am. No fevers, chills, nausea or vomiting overnight. Pt has swelling of lips and face onset yesterday after surgery. Improving. No swelling to tongue or throat.   Objective: Vital signs in last 24 hours: Temp:  [98.1 F (36.7 C)-100.1 F (37.8 C)] 98.1 F (36.7 C) (10/12 0500) Pulse Rate:  [78-109] 85 (10/12 0500) Resp:  [11-26] 18 (10/12 0500) BP: (101-176)/(65-114) 129/79 (10/12 0500) SpO2:  [96 %-100 %] 100 % (10/12 0500)    Intake/Output from previous day: 10/11 0701 - 10/12 0700 In: 2500 [I.V.:1450; IV Piggyback:1050] Out: 800 [Urine:800] Intake/Output this shift: No intake/output data recorded.  PE: Gen:  Alert, NAD, pleasant, cooperative Card:  RRR, no M/G/R heard Pulm:  CTA, no W/R/R, effort normal Skin: no rashes noted, warm and dry GU: packing removed from anus and small wound on left buttock, no purulent drainage note, mild TTP, pt tolerated packing removal well, no repacked.   Anti-infectives: Anti-infectives    Start     Dose/Rate Route Frequency Ordered Stop   01/27/17 0600  vancomycin (VANCOCIN) IVPB 750 mg/150 ml premix     750 mg 150 mL/hr over 60 Minutes Intravenous Every 12 hours 01/26/17 1706     01/26/17 2330  aztreonam (AZACTAM) 1 g in dextrose 5 % 50 mL IVPB     1 g 100 mL/hr over 30 Minutes Intravenous Every 8 hours 01/26/17 2323     01/26/17 2315   aztreonam (AZACTAM) injection 1 g  Status:  Discontinued     1 g Intramuscular Every 8 hours 01/26/17 2314 01/26/17 2323   01/26/17 2000  piperacillin-tazobactam (ZOSYN) IVPB 3.375 g  Status:  Discontinued     3.375 g 12.5 mL/hr over 240 Minutes Intravenous Every 8 hours 01/26/17 1706 01/26/17 2325   01/26/17 1700  dolutegravir (TIVICAY) tablet 50 mg     50 mg Oral Daily 01/26/17 1648     01/26/17 1700  emtricitabine-tenofovir AF (DESCOVY) 200-25 MG per tablet 1 tablet     1 tablet Oral Daily 01/26/17 1648     01/26/17 1700  valACYclovir (VALTREX) tablet 500 mg     500 mg Oral Daily 01/26/17 1648     01/26/17 1337  piperacillin-tazobactam (ZOSYN) 3.375 GM/50ML IVPB    Comments:  Wylene Simmer   : cabinet override      01/26/17 1337 01/26/17 1435   01/26/17 1315  piperacillin-tazobactam (ZOSYN) IVPB 3.375 g  Status:  Discontinued     3.375 g 12.5 mL/hr over 240 Minutes Intravenous STAT 01/26/17 1306 01/26/17 1307   01/26/17 1315  piperacillin-tazobactam (ZOSYN) IVPB 3.375 g     3.375 g 100 mL/hr over 30 Minutes Intravenous STAT 01/26/17 1307 01/26/17 1435   01/26/17 1315  vancomycin (VANCOCIN) IVPB 1000 mg/200 mL premix     1,000 mg 200 mL/hr over 60 Minutes Intravenous  Once 01/26/17 1309 01/26/17 1457  Lab Results:   Recent Labs  01/26/17 0822 01/27/17 0539  WBC 13.1* 16.0*  HGB 12.2* 10.7*  HCT 34.9* 31.5*  PLT 438* 426*   BMET  Recent Labs  01/26/17 0822 01/27/17 0539  NA 137 135  K 3.9 4.0  CL 106 104  CO2 20* 20*  GLUCOSE 122* 221*  BUN 14 14  CREATININE 1.22 1.28*  CALCIUM 9.5 9.0   PT/INR No results for input(s): LABPROT, INR in the last 72 hours. CMP     Component Value Date/Time   NA 135 01/27/2017 0539   K 4.0 01/27/2017 0539   CL 104 01/27/2017 0539   CO2 20 (L) 01/27/2017 0539   GLUCOSE 221 (H) 01/27/2017 0539   BUN 14 01/27/2017 0539   CREATININE 1.28 (H) 01/27/2017 0539   CREATININE 1.50 (H) 01/12/2017 1110   CALCIUM 9.0 01/27/2017  0539   PROT 8.7 (H) 01/26/2017 0822   ALBUMIN 3.5 01/26/2017 0822   AST 19 01/26/2017 0822   ALT 17 01/26/2017 0822   ALKPHOS 80 01/26/2017 0822   BILITOT 0.6 01/26/2017 0822   GFRNONAA 59 (L) 01/27/2017 0539   GFRNONAA 70 08/16/2016 1634   GFRAA >60 01/27/2017 0539   GFRAA 81 08/16/2016 1634   Lipase  No results found for: LIPASE  Studies/Results: Ct Abdomen Pelvis W Contrast  Result Date: 01/26/2017 CLINICAL DATA:  61 year old hypertensive male with rectal abscess. HIV. Initial encounter. EXAM: CT ABDOMEN AND PELVIS WITH CONTRAST TECHNIQUE: Multidetector CT imaging of the abdomen and pelvis was performed using the standard protocol following bolus administration of intravenous contrast. CONTRAST:  ISOVUE-300 IOPAMIDOL (ISOVUE-300) INJECTION 61% COMPARISON:  None. FINDINGS: Lower chest: Scarring/atelectasis lung bases.  Normal heart size. Hepatobiliary: 4.8 cm enhancing lesion posterior medial right lobe of liver (segment 6). 1.7 cm enhancing lesion mid aspect segment 6. No calcified gallstone. Pancreas: No pancreatic mass or inflammation. Spleen: No splenic mass or enlargement. Adrenals/Urinary Tract: Subcentimeter low-density structure right adrenal gland probably a small adenoma. No left adrenal lesion. No renal obstructing stone or hydronephrosis. Bilateral renal low-density structures, larger ones which are cysts, others too small to characterize although statistically likely cysts. Stomach/Bowel: 3.5 x 2 x 4.7 cm abscess located between the left rectum/anus and left external sphincter. Prominent thickening of the left external sphincter with haziness of fat planes within the left ischialanal/ ischialrectal fossa consistent with spread of inflammatory process. There is also hazy infiltration of the subcutaneous fat of the left buttock consistent with cellulitis. With the spread of infection, possibility of fistula is raised although not well delineated. Abscess causes narrowing of the  distal rectum. Perianal abscess extends towards the base of the penis. Portions of bowel/ stomach under distended and evaluation limited. Partially fluid-filled cecum. No other evidence of extraluminal bowel inflammatory process. No free intraperitoneal air. Vascular/Lymphatic: Atherosclerotic changes aorta and aortic branch vessels. No aortic aneurysm or large vessel occlusion. Shotty inguinal lymph nodes with fatty centers within normal limits. Normal size retroperitoneal lymph nodes. Reproductive: Perianal abscess extends towards the base of the penis. Tiny prostate gland calcifications. Other: No free intraperitoneal air. Musculoskeletal: No worrisome osseous lesion. IMPRESSION: Left perianal abscess with adjacent cellulitis as detailed above. 4.8 cm enhancing lesion posterior medial right lobe of liver (segment 6). 1.7 cm enhancing lesion mid aspect segment 6. It is possible these areas represent hemangiomas although incompletely assessed on present exam. Findings can be evaluated with dedicated contrast enhanced liver MR after present acute episode has cleared. Aortic Atherosclerosis (ICD10-I70.0). Electronically Signed  By: Lacy Duverney M.D.   On: 01/26/2017 11:13      Jerre Simon , [facility] Surgery 01/27/2017, 9:10 AM Pager: [phone] Consults: [phone] Mon-Fri 7:00 am-4:30 pm Sat-Sun 7:00 am-11:30 am

## 2017-01-27 NOTE — Progress Notes (Signed)
Patient's upper lip swelling persists and increased in size.  Swelling radiates to the left side of his face.  IV benadryl and Tylenol given and ice applied.  Patient denied pain.  No wheezing, no c/o of difficulty in swallowing.  Dr. Robb Matar notified and will assess patient at the bedside.

## 2017-01-28 LAB — BASIC METABOLIC PANEL
ANION GAP: 8 (ref 5–15)
BUN: 16 mg/dL (ref 6–20)
CHLORIDE: 107 mmol/L (ref 101–111)
CO2: 21 mmol/L — AB (ref 22–32)
Calcium: 8.9 mg/dL (ref 8.9–10.3)
Creatinine, Ser: 1.14 mg/dL (ref 0.61–1.24)
GFR calc Af Amer: >60 mL/min (ref >60)
Glucose, Bld: 169 mg/dL — ABNORMAL HIGH (ref 65–99)
POTASSIUM: 4.3 mmol/L (ref 3.5–5.1)
Sodium: 136 mmol/L (ref 135–145)

## 2017-01-28 LAB — CBC
HEMATOCRIT: 30.6 % — AB (ref 39.0–52.0)
Hemoglobin: 10.4 g/dL — ABNORMAL LOW (ref 13.0–17.0)
MCH: 30 pg (ref 26.0–34.0)
MCHC: 34 g/dL (ref 30.0–36.0)
MCV: 88.2 fL (ref 78.0–100.0)
Platelets: 411 10*3/uL — ABNORMAL HIGH (ref 150–400)
RBC: 3.47 MIL/uL — AB (ref 4.22–5.81)
RDW: 13.2 % (ref 11.5–15.5)
WBC: 20.6 10*3/uL — AB (ref 4.0–10.5)

## 2017-01-28 MED ORDER — AZTREONAM IN DEXTROSE 1 GM/50ML IV SOLN
1.0000 g | Freq: Three times a day (TID) | INTRAVENOUS | Status: DC
  Administered 2017-01-28 – 2017-01-29 (×2): 1 g via INTRAVENOUS
  Filled 2017-01-28 (×4): qty 50

## 2017-01-28 MED ORDER — METHYLPREDNISOLONE SODIUM SUCC 40 MG IJ SOLR
40.0000 mg | Freq: Every day | INTRAMUSCULAR | Status: DC
  Administered 2017-01-29: 40 mg via INTRAVENOUS
  Filled 2017-01-28: qty 1

## 2017-01-28 NOTE — Progress Notes (Signed)
Patient ID: TAREK CRAVENS, male   DOB: 11-10-1955, 61 y.o.   MRN: [id number] 2 Days Post-Op   Subjective: Still with some peri rectal pain, maybe a little better.  Packing removed yesterday, no rectal drainage.  Objective: Vital signs in last 24 hours: Temp:  [98.6 F (37 C)-99.2 F (37.3 C)] 98.8 F (37.1 C) (10/13 0938) Pulse Rate:  [90-115] 90 (10/13 0938) Resp:  [18-20] 18 (10/13 0938) BP: (149-158)/(84-89) 154/89 (10/13 0938) SpO2:  [99 %-100 %] 100 % (10/13 0938) Last BM Date: 01/27/17  Intake/Output from previous day: 10/12 0701 - 10/13 0700 In: 1230 [P.O.:1230] Out: 1800 [Urine:1800] Intake/Output this shift: No intake/output data recorded.  General appearance: alert, cooperative and no distress  Rectal: 3-4 cm area somewhat tender induration left peri rectal space, less than previously described  Lab Results:   Recent Labs  01/27/17 0539 01/28/17 0437  WBC 16.0* 20.6*  HGB 10.7* 10.4*  HCT 31.5* 30.6*  PLT 426* 411*   BMET  Recent Labs  01/27/17 0539 01/28/17 0437  NA 135 136  K 4.0 4.3  CL 104 107  CO2 20* 21*  GLUCOSE 221* 169*  BUN 14 16  CREATININE 1.28* 1.14  CALCIUM 9.0 8.9   Results for orders placed or performed during the hospital encounter of 01/26/17  Blood culture (routine x 2)     Status: None (Preliminary result)   Collection Time: 01/26/17  8:50 AM  Result Value Ref Range Status   Specimen Description BLOOD RIGHT FOREARM  Final   Special Requests   Final    BOTTLES DRAWN AEROBIC AND ANAEROBIC Blood Culture adequate volume   Culture   Final    NO GROWTH 1 DAY Performed at [facility], [address] [place], [place] [address]    Report Status PENDING  Incomplete  Blood culture (routine x 2)     Status: None (Preliminary result)   Collection Time: 01/26/17  9:04 AM  Result Value Ref Range Status   Specimen Description BLOOD RIGHT HAND  Final   Special Requests   Final    BOTTLES DRAWN AEROBIC AND ANAEROBIC Blood  Culture results may not be optimal due to an inadequate volume of blood received in culture bottles   Culture   Final    NO GROWTH 1 DAY Performed at [facility], [address] [place], [place] [address]    Report Status PENDING  Incomplete  Aerobic/Anaerobic Culture (surgical/deep wound)     Status: None (Preliminary result)   Collection Time: 01/26/17  3:11 PM  Result Value Ref Range Status   Specimen Description ABSCESS PERIRECTAL  Final   Special Requests NONE  Final   Gram Stain   Final    ABUNDANT WBC PRESENT, PREDOMINANTLY PMN MODERATE GRAM NEGATIVE RODS    Culture   Final    CULTURE REINCUBATED FOR BETTER GROWTH Performed at [facility], [address], [place], [place] [address]    Report Status PENDING  Incomplete      Studies/Results: Ct Abdomen Pelvis W Contrast  Result Date: 01/26/2017 CLINICAL DATA:  61 year old hypertensive male with rectal abscess. HIV. Initial encounter. EXAM: CT ABDOMEN AND PELVIS WITH CONTRAST TECHNIQUE: Multidetector CT imaging of the abdomen and pelvis was performed using the standard protocol following bolus administration of intravenous contrast. CONTRAST:  ISOVUE-300 IOPAMIDOL (ISOVUE-300) INJECTION 61% COMPARISON:  None. FINDINGS: Lower chest: Scarring/atelectasis lung bases.  Normal heart size. Hepatobiliary: 4.8 cm enhancing lesion posterior medial  right lobe of liver (segment 6). 1.7 cm enhancing lesion mid aspect segment 6. No calcified gallstone. Pancreas: No pancreatic mass or inflammation. Spleen: No splenic mass or enlargement. Adrenals/Urinary Tract: Subcentimeter low-density structure right adrenal gland probably a small adenoma. No left adrenal lesion. No renal obstructing stone or hydronephrosis. Bilateral renal low-density structures, larger ones which are cysts, others too small to characterize although statistically likely cysts. Stomach/Bowel: 3.5 x 2 x 4.7 cm abscess located between the left rectum/anus and  left external sphincter. Prominent thickening of the left external sphincter with haziness of fat planes within the left ischialanal/ ischialrectal fossa consistent with spread of inflammatory process. There is also hazy infiltration of the subcutaneous fat of the left buttock consistent with cellulitis. With the spread of infection, possibility of fistula is raised although not well delineated. Abscess causes narrowing of the distal rectum. Perianal abscess extends towards the base of the penis. Portions of bowel/ stomach under distended and evaluation limited. Partially fluid-filled cecum. No other evidence of extraluminal bowel inflammatory process. No free intraperitoneal air. Vascular/Lymphatic: Atherosclerotic changes aorta and aortic branch vessels. No aortic aneurysm or large vessel occlusion. Shotty inguinal lymph nodes with fatty centers within normal limits. Normal size retroperitoneal lymph nodes. Reproductive: Perianal abscess extends towards the base of the penis. Tiny prostate gland calcifications. Other: No free intraperitoneal air. Musculoskeletal: No worrisome osseous lesion. IMPRESSION: Left perianal abscess with adjacent cellulitis as detailed above. 4.8 cm enhancing lesion posterior medial right lobe of liver (segment 6). 1.7 cm enhancing lesion mid aspect segment 6. It is possible these areas represent hemangiomas although incompletely assessed on present exam. Findings can be evaluated with dedicated contrast enhanced liver MR after present acute episode has cleared. Aortic Atherosclerosis (ICD10-I70.0). Electronically Signed   By: Lacy Duverney M.D.   On: 01/26/2017 11:13    Anti-infectives: Anti-infectives    Start     Dose/Rate Route Frequency Ordered Stop   01/27/17 0600  vancomycin (VANCOCIN) IVPB 750 mg/150 ml premix     750 mg 150 mL/hr over 60 Minutes Intravenous Every 12 hours 01/26/17 1706     01/26/17 2330  aztreonam (AZACTAM) 1 g in dextrose 5 % 50 mL IVPB     1 g 100  mL/hr over 30 Minutes Intravenous Every 8 hours 01/26/17 2323     01/26/17 2315  aztreonam (AZACTAM) injection 1 g  Status:  Discontinued     1 g Intramuscular Every 8 hours 01/26/17 2314 01/26/17 2323   01/26/17 2000  piperacillin-tazobactam (ZOSYN) IVPB 3.375 g  Status:  Discontinued     3.375 g 12.5 mL/hr over 240 Minutes Intravenous Every 8 hours 01/26/17 1706 01/26/17 2325   01/26/17 1700  dolutegravir (TIVICAY) tablet 50 mg     50 mg Oral Daily 01/26/17 1648     01/26/17 1700  emtricitabine-tenofovir AF (DESCOVY) 200-25 MG per tablet 1 tablet     1 tablet Oral Daily 01/26/17 1648     01/26/17 1700  valACYclovir (VALTREX) tablet 500 mg     500 mg Oral Daily 01/26/17 1648     01/26/17 1337  piperacillin-tazobactam (ZOSYN) 3.375 GM/50ML IVPB    Comments:  Wylene Simmer   : cabinet override      01/26/17 1337 01/26/17 1435   01/26/17 1315  piperacillin-tazobactam (ZOSYN) IVPB 3.375 g  Status:  Discontinued     3.375 g 12.5 mL/hr over 240 Minutes Intravenous STAT 01/26/17 1306 01/26/17 1307   01/26/17 1315  piperacillin-tazobactam (ZOSYN) IVPB 3.375 g  3.375 g 100 mL/hr over 30 Minutes Intravenous STAT 01/26/17 1307 01/26/17 1435   01/26/17 1315  vancomycin (VANCOCIN) IVPB 1000 mg/200 mL premix     1,000 mg 200 mL/hr over 60 Minutes Intravenous  Once 01/26/17 1309 01/26/17 1457      Assessment/Plan: s/p Procedure(s): IRRIGATION AND DEBRIDEMENT PERIRECTAL ABSCESS Worsening leukocytosis but received steroids for facial swelling which could explain this.  Cont IV abx and observation today.   LOS: 2 days    Keontre Defino T 01/28/2017

## 2017-01-28 NOTE — Progress Notes (Signed)
PROGRESS NOTE    [name]  EAV:[id number] DOB: 1955/11/30 DOA: 01/26/2017 PCP: Judyann Munson, MD    Brief Narrative:  61 y.o. male with medical history significant of HIV and HTN presenting with perirectal abscess.   Patient was a few weeks ago he had food poisoning with diarrhea. After that he started noticing pain in the bottom. He notes pain since then for a few weeks. He notes that is worse with movement and better with ice. He denies any fevers, chills, nausea, vomiting, diarrhea, dysuria, rash.  On Tuesday he presented to Dr. Chaya Jan office he was started on Augmentin and doxycycline for concern for infection.  He presented to the ED today due to persistent pain.  Assessment & Plan:   Principal Problem:   Perirectal abscess Active Problems:   HIV (human immunodeficiency virus infection) (HCC)   Essential hypertension  Perirectal abscess:  TR[facility] requested to admit due to uncontrolled HTN in ED and HIV.   CT with L perianal abscess with adjacent cellulitis Pt was started on vanc/zosyn Bcx ordered, thus far neg x2 Pt is s/p surgical debridement Wound culture with abundant gm neg rods Discussed with General Surgery with recs to cont current regimen  SIRS: Presented with tachycardia, leukocytosis, 2/2 above.  Tachycardia improved, likely related to pain -Clinically improved  HIV Positive:  September with undectectable viral load.  CD4 count 700s.  He has not told his partner about his HIV positive status.  Has been encouraged to share this information with his partner.  Continued descovy/tivicay Continue valtrex as tolerated Stable at present  HTN: elevated in ED, likely due to pain, improved now.   Initially continued on amlodipine/HCTZ/lisinopril -given concerns for anaphylaxis, lisinopril was also held  Liver lesions: 4.7 cm and 1.7 cm enhancing lesions.  Possible hemangiomas?  Will recommend non-emergent recommended f/u contrasted liver MR after acute episode  resolved, can be done as outpatient  Anaphylaxis -Facial/lip swelling noted shortly after starting zosyn -Continued improvement with IV steroids, famotidine, stopping zosyn and lisinopril -Wean steroids  Leukocytosis -Likely secondary to recent steroid use for anaphylaxis -Afebrile -Will wean steroids. Repeat CBC in AM  DVT prophylaxis: Heparin subQ Code Status: Full Family Communication: Pt in room, family not at bedside Disposition Plan: Uncertain at this time  Consultants:   General Surgery  Procedures:   Wound debridement 10/11  Antimicrobials: Anti-infectives    Start     Dose/Rate Route Frequency Ordered Stop   01/27/17 0600  vancomycin (VANCOCIN) IVPB 750 mg/150 ml premix     750 mg 150 mL/hr over 60 Minutes Intravenous Every 12 hours 01/26/17 1706     01/26/17 2330  aztreonam (AZACTAM) 1 g in dextrose 5 % 50 mL IVPB     1 g 100 mL/hr over 30 Minutes Intravenous Every 8 hours 01/26/17 2323     01/26/17 2315  aztreonam (AZACTAM) injection 1 g  Status:  Discontinued     1 g Intramuscular Every 8 hours 01/26/17 2314 01/26/17 2323   01/26/17 2000  piperacillin-tazobactam (ZOSYN) IVPB 3.375 g  Status:  Discontinued     3.375 g 12.5 mL/hr over 240 Minutes Intravenous Every 8 hours 01/26/17 1706 01/26/17 2325   01/26/17 1700  dolutegravir (TIVICAY) tablet 50 mg     50 mg Oral Daily 01/26/17 1648     01/26/17 1700  emtricitabine-tenofovir AF (DESCOVY) 200-25 MG per tablet 1 tablet     1 tablet Oral Daily 01/26/17 1648     01/26/17 1700  valACYclovir (  VALTREX) tablet 500 mg     500 mg Oral Daily 01/26/17 1648     01/26/17 1337  piperacillin-tazobactam (ZOSYN) 3.375 GM/50ML IVPB    Comments:  Wylene Simmer   : cabinet override      01/26/17 1337 01/26/17 1435   01/26/17 1315  piperacillin-tazobactam (ZOSYN) IVPB 3.375 g  Status:  Discontinued     3.375 g 12.5 mL/hr over 240 Minutes Intravenous STAT 01/26/17 1306 01/26/17 1307   01/26/17 1315  piperacillin-tazobactam  (ZOSYN) IVPB 3.375 g     3.375 g 100 mL/hr over 30 Minutes Intravenous STAT 01/26/17 1307 01/26/17 1435   01/26/17 1315  vancomycin (VANCOCIN) IVPB 1000 mg/200 mL premix     1,000 mg 200 mL/hr over 60 Minutes Intravenous  Once 01/26/17 1309 01/26/17 1457      Subjective: Without complaints.  Objective: Vitals:   01/27/17 1407 01/27/17 2145 01/28/17 0514 01/28/17 0938  BP: (!) 158/84 (!) 149/84 (!) 154/85 (!) 154/89  Pulse: (!) 115 98 95 90  Resp: Temp: 98.8 F (37.1 C) 99.2 F (37.3 C) 98.6 F (37 C) 98.8 F (37.1 C)  TempSrc: Oral Oral Oral Oral  SpO2: 100% 100% 100% 100%    Intake/Output Summary (Last 24 hours) at 01/28/17 1441 Last data filed at 01/28/17 0950  Gross per 24 hour  Intake              830 ml  Output             2150 ml  Net            -1320 ml   There were no vitals filed for this visit.  Examination: General exam: Awake, laying in bed, in nad Respiratory system: Normal respiratory effort, no wheezing Cardiovascular system: regular rate, s1, s2 Gastrointestinal system: Soft, nondistended, positive BS Central nervous system: CN2-12 grossly intact, strength intact Extremities: Perfused, no clubbing Skin: Normal skin turgor, no notable skin lesions seen Psychiatry: Mood normal // no visual hallucinations   Data Reviewed: I have personally reviewed following labs and imaging studies  CBC:  Recent Labs Lab 01/26/17 0822 01/27/17 0539 01/28/17 0437  WBC 13.1* 16.0* 20.6*  NEUTROABS 8.8*  --   --   HGB 12.2* 10.7* 10.4*  HCT 34.9* 31.5* 30.6*  MCV 88.1 88.7 88.2  PLT 438* 426* 411*   Basic Metabolic Panel:  Recent Labs Lab 01/26/17 0822 01/27/17 0539 01/28/17 0437  NA 137 135 136  K 3.9 4.0 4.3  CL 106 104 107  CO2 20* 20* 21*  GLUCOSE 122* 221* 169*  BUN CREATININE 1.22 1.28* 1.14  CALCIUM 9.5 9.0 8.9   GFR: Estimated Creatinine Clearance: 67.6 mL/min (by C-G formula based on SCr of 1.14 mg/dL). Liver  Function Tests:  Recent Labs Lab 01/26/17 0822  AST 19  ALT 17  ALKPHOS 80  BILITOT 0.6  PROT 8.7*  ALBUMIN 3.5   No results for input(s): LIPASE, AMYLASE in the last 168 hours. No results for input(s): AMMONIA in the last 168 hours. Coagulation Profile: No results for input(s): INR, PROTIME in the last 168 hours. Cardiac Enzymes: No results for input(s): CKTOTAL, CKMB, CKMBINDEX, TROPONINI in the last 168 hours. BNP (last 3 results) No results for input(s): PROBNP in the last 8760 hours. HbA1C: No results for input(s): HGBA1C in the last 72 hours. CBG: No results for input(s): GLUCAP in the last 168 hours. Lipid Profile: No results for input(s): CHOL,  HDL, LDLCALC, TRIG, CHOLHDL, LDLDIRECT in the last 72 hours. Thyroid Function Tests: No results for input(s): TSH, T4TOTAL, FREET4, T3FREE, THYROIDAB in the last 72 hours. Anemia Panel: No results for input(s): VITAMINB12, FOLATE, FERRITIN, TIBC, IRON, RETICCTPCT in the last 72 hours. Sepsis Labs:  Recent Labs Lab 01/26/17 0835  LATICACIDVEN 1.3    Recent Results (from the past 240 hour(s))  Blood culture (routine x 2)     Status: None (Preliminary result)   Collection Time: 01/26/17  8:50 AM  Result Value Ref Range Status   Specimen Description BLOOD RIGHT FOREARM  Final   Special Requests   Final    BOTTLES DRAWN AEROBIC AND ANAEROBIC Blood Culture adequate volume   Culture   Final    NO GROWTH 1 DAY Performed at [facility], [address], [place], [place] [address]    Report Status PENDING  Incomplete  Blood culture (routine x 2)     Status: None (Preliminary result)   Collection Time: 01/26/17  9:04 AM  Result Value Ref Range Status   Specimen Description BLOOD RIGHT HAND  Final   Special Requests   Final    BOTTLES DRAWN AEROBIC AND ANAEROBIC Blood Culture results may not be optimal due to an inadequate volume of blood received in culture bottles   Culture   Final    NO GROWTH 1 DAY Performed at  [facility], [address], [place], [place] [address]    Report Status PENDING  Incomplete  Aerobic/Anaerobic Culture (surgical/deep wound)     Status: None (Preliminary result)   Collection Time: 01/26/17  3:11 PM  Result Value Ref Range Status   Specimen Description ABSCESS PERIRECTAL  Final   Special Requests NONE  Final   Gram Stain   Final    ABUNDANT WBC PRESENT, PREDOMINANTLY PMN MODERATE GRAM NEGATIVE RODS Performed at [facility], [address] [place], [place] [address]    Culture   Final    CULTURE REINCUBATED FOR BETTER GROWTH NO ANAEROBES ISOLATED; CULTURE IN PROGRESS FOR 5 DAYS    Report Status PENDING  Incomplete     Radiology Studies: No results found.  Scheduled Meds: . acetaminophen  1,000 mg Oral Q6H  . amLODipine  10 mg Oral Daily  . docusate sodium  100 mg Oral BID  . dolutegravir  50 mg Oral Daily  . emtricitabine-tenofovir AF  1 tablet Oral Daily  . famotidine  20 mg Oral BID  . gabapentin  300 mg Oral BID  . heparin  5,000 Units Subcutaneous Q8H  . hydrochlorothiazide  25 mg Oral Daily  . [START ON 01/29/2017] methylPREDNISolone (SOLU-MEDROL) injection  40 mg Intravenous Daily  . pantoprazole  40 mg Oral Daily  . rosuvastatin  10 mg Oral QHS  . valACYclovir  500 mg Oral Daily   Continuous Infusions: . sodium chloride 100 mL/hr at 01/28/17 0008  . 0.9 % NaCl with KCl 20 mEq / L 50 mL/hr (01/27/17 1017)  . aztreonam 1 g (01/28/17 1323)  . vancomycin Stopped (01/28/17 0701)     LOS: 2 days   Dorea Duff, Scheryl Marten, MD [facility] Pager [phone]  If 7PM-7AM, please contact night-coverage [email or web address] Password [email or web address] 01/28/2017, 2:41 PM

## 2017-01-29 LAB — CBC
HCT: 36 % — ABNORMAL LOW (ref 39.0–52.0)
HEMOGLOBIN: 12.6 g/dL — AB (ref 13.0–17.0)
MCH: 30.7 pg (ref 26.0–34.0)
MCHC: 35 g/dL (ref 30.0–36.0)
MCV: 87.8 fL (ref 78.0–100.0)
PLATELETS: 508 10*3/uL — AB (ref 150–400)
RBC: 4.1 MIL/uL — AB (ref 4.22–5.81)
RDW: 12.9 % (ref 11.5–15.5)
WBC: 17.1 10*3/uL — ABNORMAL HIGH (ref 4.0–10.5)

## 2017-01-29 LAB — BASIC METABOLIC PANEL
ANION GAP: 11 (ref 5–15)
BUN: 15 mg/dL (ref 6–20)
CALCIUM: 9.6 mg/dL (ref 8.9–10.3)
CHLORIDE: 103 mmol/L (ref 101–111)
CO2: 23 mmol/L (ref 22–32)
Creatinine, Ser: 1.16 mg/dL (ref 0.61–1.24)
GFR calc non Af Amer: >60 mL/min (ref >60)
Glucose, Bld: 101 mg/dL — ABNORMAL HIGH (ref 65–99)
Potassium: 3.8 mmol/L (ref 3.5–5.1)
Sodium: 137 mmol/L (ref 135–145)

## 2017-01-29 LAB — VANCOMYCIN, TROUGH: VANCOMYCIN TR: 13 ug/mL — AB (ref 15–20)

## 2017-01-29 MED ORDER — METOPROLOL TARTRATE 25 MG PO TABS
25.0000 mg | ORAL_TABLET | Freq: Two times a day (BID) | ORAL | Status: DC
  Administered 2017-01-29: 25 mg via ORAL
  Filled 2017-01-29: qty 1

## 2017-01-29 MED ORDER — FAMOTIDINE 20 MG PO TABS: 20.0000 mg | ORAL_TABLET | Freq: Two times a day (BID) | ORAL | 0 refills | Status: DC

## 2017-01-29 MED ORDER — SULFAMETHOXAZOLE-TRIMETHOPRIM 800-160 MG PO TABS: 1.0000 | ORAL_TABLET | Freq: Two times a day (BID) | ORAL | 0 refills | Status: DC

## 2017-01-29 MED ORDER — GABAPENTIN 300 MG PO CAPS: 300.0000 mg | ORAL_CAPSULE | Freq: Two times a day (BID) | ORAL | 0 refills | Status: DC

## 2017-01-29 MED ORDER — METOPROLOL TARTRATE 25 MG PO TABS: 25.0000 mg | ORAL_TABLET | Freq: Two times a day (BID) | ORAL | 0 refills | Status: DC

## 2017-01-29 MED ORDER — PREDNISONE 5 MG PO TABS: 5.0000 mg | ORAL_TABLET | Freq: Every day | ORAL | Status: DC

## 2017-01-29 NOTE — Care Management Note (Signed)
Case Management Note  Patient Details  Name: [name] MRN: [id number] Date of Birth: 1955-05-30  Subjective/Objective:    Perirectal abscess                Action/Plan: Discharge Planning: NCM spoke to pt and states he can afford his medications. Has appt with PCP on 11/7. He will call to get an earlier appt.   PCP Andre Munson MD  Expected Discharge Date:  01/29/17               Expected Discharge Plan:  Home w Home Health Services  In-House Referral:  NA  Discharge planning Services  CM Consult  Post Acute Care Choice:  NA Choice offered to:  NA  DME Arranged:  N/A DME Agency:  NA  HH Arranged:  NA HH Agency:  NA  Status of Service:  Completed, signed off  If discussed at Long Length of Stay Meetings, dates discussed:    Additional Comments:  Elliot Cousin, RN 01/29/2017, 12:40 PM

## 2017-01-29 NOTE — Discharge Summary (Addendum)
Physician Discharge Summary  [name] ZOX:[id number] DOB: [birth year] DOA: 01/26/2017  PCP: Judyann Munson, MD  Admit date: 01/26/2017 Discharge date: 01/29/2017  Admitted From: Home Disposition:  Home  Recommendations for Outpatient Follow-up:  1. Follow up with PCP in 1-2 weeks 2. Follow up with General Surgery as scheduled 3. Recommend outpatient liver MRI to f/u on hemangiomas seen  Discharge Condition:improved CODE STATUS:full Diet recommendation: Regular   Brief/Interim Summary: 61 y.o.malewith medical history significant of HIV and HTN presenting with perirectal abscess.   Patient was a few weeks ago he had food poisoning with diarrhea. After that he started noticing pain in the bottom. He notes pain since then for a few weeks. He notes that is worse with movement and better with ice. He denies any fevers, chills, nausea, vomiting, diarrhea, dysuria, rash. On Tuesday he presented to Dr. Chaya Jan office he was started on Augmentin and doxycycline for concern for infection. He presented to the ED today due to persistent pain.  Discharge Diagnoses:  Principal Problem:   Perirectal abscess Active Problems:   HIV (human immunodeficiency virus infection) (HCC)   Essential hypertension  Perirectal abscess with ecoli and sepsis present on admit:TRH requested to admit due to uncontrolled HTN in ED and HIV.  CT with L perianal abscess with adjacent cellulitis Pt was initially started on vanc/zosyn Bcx ordered, thus far neg x2 Pt is s/p surgical debridement Wound culture with abundant gm neg rods later found to be ecoli Will prescribe bactrim to complete course of treatment. Pt to follow up with ID within one week for follow up  SIRS: Presented with tachycardia, leukocytosis, 2/2 above. Tachycardia improved, likely related to pain -Clinically improved  HIV Positive:September with undectectable viral load. CD4 count 700s. He has not told his partner about his  HIV positive status. Has been encouraged to share this information with his partner.  Continued descovy/tivicay Continue valtrex as tolerated Stable at present  WJX:BJYNWGNF in ED, likely due to pain, improved now.  Initially continued on amlodipine/HCTZ/lisinopril -given concerns for anaphylaxis, lisinopril was also held -Started metoprolol  Liver lesions:4.7 cm and 1.7 cm enhancing lesions. Possible hemangiomas? Will recommend non-emergent recommended f/u contrasted liver MR after acute episode resolved, can be done as outpatient  Anaphylaxis -Facial/lip swelling noted shortly after starting zosyn -Continued improvement with IV steroids, famotidine, stopping zosyn and lisinopril -Wean steroids as outpatient  Leukocytosis -Likely secondary to recent steroid use for anaphylaxis -Remained afebrile -WBC improved with weaning steroids -Cont to wean steroids.  Discharge Instructions   Allergies as of 01/29/2017      Reactions   Zosyn [piperacillin Sod-tazobactam So] Other (See Comments)   Lips swelling      Medication List    STOP taking these medications   amoxicillin-clavulanate 875-125 MG tablet Commonly known as:  AUGMENTIN   doxycycline 100 MG tablet Commonly known as:  VIBRA-TABS   hydrocortisone cream 1 %   lisinopril 40 MG tablet Commonly known as:  PRINIVIL,ZESTRIL     TAKE these medications   ADVIL PM 200-38 MG Tabs Generic drug:  Ibuprofen-Diphenhydramine Cit Take 2 tablets by mouth at bedtime as needed (for pain/sleep).   amLODipine 10 MG tablet Commonly known as:  NORVASC Take 1 tablet (10 mg total) by mouth daily.   CRESTOR 10 MG tablet Generic drug:  rosuvastatin Take 10 mg by mouth at bedtime.   dolutegravir 50 MG tablet Commonly known as:  TIVICAY Take 1 tablet (50 mg total) by mouth daily.   emtricitabine-tenofovir AF  200-25 MG tablet Commonly known as:  DESCOVY Take 1 tablet by mouth daily.   famotidine 20 MG  tablet Commonly known as:  PEPCID Take 1 tablet (20 mg total) by mouth 2 (two) times daily.   gabapentin 300 MG capsule Commonly known as:  NEURONTIN Take 1 capsule (300 mg total) by mouth 2 (two) times daily.   hydrochlorothiazide 25 MG tablet Commonly known as:  HYDRODIURIL Take 1 tablet (25 mg total) by mouth daily.   megestrol 400 MG/10ML suspension Commonly known as:  MEGACE Take 10 mLs (400 mg total) by mouth daily.   metoprolol tartrate 25 MG tablet Commonly known as:  LOPRESSOR Take 1 tablet (25 mg total) by mouth 2 (two) times daily.   predniSONE 5 MG tablet Commonly known as:  DELTASONE Take 1 tablet (5 mg total) by mouth daily with breakfast.   sulfamethoxazole-trimethoprim 800-160 MG tablet Commonly known as:  BACTRIM DS,SEPTRA DS Take 1 tablet by mouth 2 (two) times daily.   valACYclovir 500 MG tablet Commonly known as:  VALTREX Take 1 tablet (500 mg total) by mouth daily.      Follow-up Information    Gaynelle Adu, MD. Schedule an appointment as soon as possible for a visit in 3 week(s).   Specialty:  General Surgery Contact information: [address] [place] [address] [phone]        Judyann Munson, MD. Schedule an appointment as soon as possible for a visit in 1 week(s).   Specialty:  Infectious Diseases Contact information: [address] [place] [address] [phone]          Allergies  Allergen Reactions  . Zosyn [Piperacillin Sod-Tazobactam So] Other (See Comments)    Lips swelling    Consultations:  General Surgery  Procedures/Studies: Ct Abdomen Pelvis W Contrast  Result Date: 01/26/2017 CLINICAL DATA:  61 year old hypertensive male with rectal abscess. HIV. Initial encounter. EXAM: CT ABDOMEN AND PELVIS WITH CONTRAST TECHNIQUE: Multidetector CT imaging of the abdomen and pelvis was performed using the standard protocol following bolus administration of intravenous contrast. CONTRAST:   ISOVUE-300 IOPAMIDOL (ISOVUE-300) INJECTION 61% COMPARISON:  None. FINDINGS: Lower chest: Scarring/atelectasis lung bases.  Normal heart size. Hepatobiliary: 4.8 cm enhancing lesion posterior medial right lobe of liver (segment 6). 1.7 cm enhancing lesion mid aspect segment 6. No calcified gallstone. Pancreas: No pancreatic mass or inflammation. Spleen: No splenic mass or enlargement. Adrenals/Urinary Tract: Subcentimeter low-density structure right adrenal gland probably a small adenoma. No left adrenal lesion. No renal obstructing stone or hydronephrosis. Bilateral renal low-density structures, larger ones which are cysts, others too small to characterize although statistically likely cysts. Stomach/Bowel: 3.5 x 2 x 4.7 cm abscess located between the left rectum/anus and left external sphincter. Prominent thickening of the left external sphincter with haziness of fat planes within the left ischialanal/ ischialrectal fossa consistent with spread of inflammatory process. There is also hazy infiltration of the subcutaneous fat of the left buttock consistent with cellulitis. With the spread of infection, possibility of fistula is raised although not well delineated. Abscess causes narrowing of the distal rectum. Perianal abscess extends towards the base of the penis. Portions of bowel/ stomach under distended and evaluation limited. Partially fluid-filled cecum. No other evidence of extraluminal bowel inflammatory process. No free intraperitoneal air. Vascular/Lymphatic: Atherosclerotic changes aorta and aortic branch vessels. No aortic aneurysm or large vessel occlusion. Shotty inguinal lymph nodes with fatty centers within normal limits. Normal size retroperitoneal lymph nodes. Reproductive: Perianal  abscess extends towards the base of the penis. Tiny prostate gland calcifications. Other: No free intraperitoneal air. Musculoskeletal: No worrisome osseous lesion. IMPRESSION: Left perianal abscess with adjacent  cellulitis as detailed above. 4.8 cm enhancing lesion posterior medial right lobe of liver (segment 6). 1.7 cm enhancing lesion mid aspect segment 6. It is possible these areas represent hemangiomas although incompletely assessed on present exam. Findings can be evaluated with dedicated contrast enhanced liver MR after present acute episode has cleared. Aortic Atherosclerosis (ICD10-I70.0). Electronically Signed   By: Lacy Duverney M.D.   On: 01/26/2017 11:13    Subjective: Eager to go home  Discharge Exam: Vitals:   01/28/17 2222 01/29/17 0601  BP: (!) 164/94 (!) 163/93  Pulse: 84 88  Resp: 18 18  Temp: 98.8 F (37.1 C) 98.6 F (37 C)  SpO2: 100% 100%   Vitals:   01/28/17 0938 01/28/17 1445 01/28/17 2222 01/29/17 0601  BP: (!) 154/89 (!) 151/81 (!) 164/94 (!) 163/93  Pulse: 90 89 84 88  Resp: Temp: 98.8 F (37.1 C) 98.6 F (37 C) 98.8 F (37.1 C) 98.6 F (37 C)  TempSrc: Oral Oral Oral Oral  SpO2: 100% 100% 100% 100%    General: Pt is alert, awake, not in acute distress Cardiovascular: RRR, S1/S2 +, no rubs, no gallops Respiratory: CTA bilaterally, no wheezing, no rhonchi Abdominal: Soft, NT, ND, bowel sounds + Extremities: no edema, no cyanosis   The results of significant diagnostics from this hospitalization (including imaging, microbiology, ancillary and laboratory) are listed below for reference.     Microbiology: Recent Results (from the past 240 hour(s))  Blood culture (routine x 2)     Status: None (Preliminary result)   Collection Time: 01/26/17  8:50 AM  Result Value Ref Range Status   Specimen Description BLOOD RIGHT FOREARM  Final   Special Requests   Final    BOTTLES DRAWN AEROBIC AND ANAEROBIC Blood Culture adequate volume   Culture   Final    NO GROWTH 3 DAYS Performed at [facility], [address] [place], [place] [address]    Report Status PENDING  Incomplete  Blood culture (routine x 2)     Status: None (Preliminary  result)   Collection Time: 01/26/17  9:04 AM  Result Value Ref Range Status   Specimen Description BLOOD RIGHT HAND  Final   Special Requests   Final    BOTTLES DRAWN AEROBIC AND ANAEROBIC Blood Culture results may not be optimal due to an inadequate volume of blood received in culture bottles   Culture   Final    NO GROWTH 3 DAYS Performed at [facility], [address] [place], [place] [address]    Report Status PENDING  Incomplete  Aerobic/Anaerobic Culture (surgical/deep wound)     Status: Abnormal (Preliminary result)   Collection Time: 01/26/17  3:11 PM  Result Value Ref Range Status   Specimen Description ABSCESS PERIRECTAL  Final   Special Requests NONE  Final   Gram Stain   Final    ABUNDANT WBC PRESENT, PREDOMINANTLY PMN MODERATE GRAM NEGATIVE RODS Performed at [facility], [address], [place], [place] [address]    Culture (A)  Final    ESCHERICHIA COLI NO ANAEROBES ISOLATED; CULTURE IN PROGRESS FOR 5 DAYS    Report Status PENDING  Incomplete   Organism ID, Bacteria ESCHERICHIA COLI  Final      Susceptibility   Escherichia coli -  MIC*    AMPICILLIN >=32 RESISTANT Resistant     CEFAZOLIN 16 SENSITIVE Sensitive     CEFEPIME <=1 SENSITIVE Sensitive     CEFTAZIDIME <=1 SENSITIVE Sensitive     CEFTRIAXONE <=1 SENSITIVE Sensitive     CIPROFLOXACIN <=0.25 SENSITIVE Sensitive     GENTAMICIN <=1 SENSITIVE Sensitive     IMIPENEM <=0.25 SENSITIVE Sensitive     TRIMETH/SULFA <=20 SENSITIVE Sensitive     AMPICILLIN/SULBACTAM >=32 RESISTANT Resistant     PIP/TAZO 8 SENSITIVE Sensitive     Extended ESBL NEGATIVE Sensitive     * ESCHERICHIA COLI     Labs: BNP (last 3 results) No results for input(s): BNP in the last 8760 hours. Basic Metabolic Panel:  Recent Labs Lab 01/26/17 0822 01/27/17 0539 01/28/17 0437 01/29/17 0537  NA 137 135 136 137  K 3.9 4.0 4.3 3.8  CL 106 104 107 103  CO2 20* 20* 21* 23  GLUCOSE 122* 221* 169* 101*  BUN CREATININE 1.22 1.28* 1.14 1.16  CALCIUM 9.5 9.0 8.9 9.6   Liver Function Tests:  Recent Labs Lab 01/26/17 0822  AST 19  ALT 17  ALKPHOS 80  BILITOT 0.6  PROT 8.7*  ALBUMIN 3.5   No results for input(s): LIPASE, AMYLASE in the last 168 hours. No results for input(s): AMMONIA in the last 168 hours. CBC:  Recent Labs Lab 01/26/17 0822 01/27/17 0539 01/28/17 0437 01/29/17 0537  WBC 13.1* 16.0* 20.6* 17.1*  NEUTROABS 8.8*  --   --   --   HGB 12.2* 10.7* 10.4* 12.6*  HCT 34.9* 31.5* 30.6* 36.0*  MCV 88.1 88.7 88.2 87.8  PLT 438* 426* 411* 508*   Cardiac Enzymes: No results for input(s): CKTOTAL, CKMB, CKMBINDEX, TROPONINI in the last 168 hours. BNP: Invalid input(s): POCBNP CBG: No results for input(s): GLUCAP in the last 168 hours. D-Dimer No results for input(s): DDIMER in the last 72 hours. Hgb A1c No results for input(s): HGBA1C in the last 72 hours. Lipid Profile No results for input(s): CHOL, HDL, LDLCALC, TRIG, CHOLHDL, LDLDIRECT in the last 72 hours. Thyroid function studies No results for input(s): TSH, T4TOTAL, T3FREE, THYROIDAB in the last 72 hours.  Invalid input(s): FREET3 Anemia work up No results for input(s): VITAMINB12, FOLATE, FERRITIN, TIBC, IRON, RETICCTPCT in the last 72 hours. Urinalysis    Component Value Date/Time   COLORURINE DARK YELLOW 12/16/2014 1500   APPEARANCEUR CLEAR 12/16/2014 1500   LABSPEC 1.027 12/16/2014 1500   PHURINE 5.0 12/16/2014 1500   GLUCOSEU NEGATIVE 12/16/2014 1500   HGBUR NEGATIVE 12/16/2014 1500   BILIRUBINUR NEGATIVE 12/16/2014 1500   KETONESUR TRACE (A) 12/16/2014 1500   PROTEINUR NEGATIVE 12/16/2014 1500   UROBILINOGEN 0.2 07/18/2013 1004   NITRITE NEGATIVE 12/16/2014 1500   LEUKOCYTESUR 2+ (A) 12/16/2014 1500   Sepsis Labs Invalid input(s): PROCALCITONIN,  WBC,  LACTICIDVEN Microbiology Recent Results (from the past 240 hour(s))  Blood culture (routine x 2)     Status: None (Preliminary result)    Collection Time: 01/26/17  8:50 AM  Result Value Ref Range Status   Specimen Description BLOOD RIGHT FOREARM  Final   Special Requests   Final    BOTTLES DRAWN AEROBIC AND ANAEROBIC Blood Culture adequate volume   Culture   Final    NO GROWTH 3 DAYS Performed at [facility], [address], [place], [place] [address]    Report Status PENDING  Incomplete  Blood culture (routine x  2)     Status: None (Preliminary result)   Collection Time: 01/26/17  9:04 AM  Result Value Ref Range Status   Specimen Description BLOOD RIGHT HAND  Final   Special Requests   Final    BOTTLES DRAWN AEROBIC AND ANAEROBIC Blood Culture results may not be optimal due to an inadequate volume of blood received in culture bottles   Culture   Final    NO GROWTH 3 DAYS Performed at [facility], [address] [place], [place] [address]    Report Status PENDING  Incomplete  Aerobic/Anaerobic Culture (surgical/deep wound)     Status: Abnormal (Preliminary result)   Collection Time: 01/26/17  3:11 PM  Result Value Ref Range Status   Specimen Description ABSCESS PERIRECTAL  Final   Special Requests NONE  Final   Gram Stain   Final    ABUNDANT WBC PRESENT, PREDOMINANTLY PMN MODERATE GRAM NEGATIVE RODS Performed at [facility], [address] [place], [place] [address]    Culture (A)  Final    ESCHERICHIA COLI NO ANAEROBES ISOLATED; CULTURE IN PROGRESS FOR 5 DAYS    Report Status PENDING  Incomplete   Organism ID, Bacteria ESCHERICHIA COLI  Final      Susceptibility   Escherichia coli - MIC*    AMPICILLIN >=32 RESISTANT Resistant     CEFAZOLIN 16 SENSITIVE Sensitive     CEFEPIME <=1 SENSITIVE Sensitive     CEFTAZIDIME <=1 SENSITIVE Sensitive     CEFTRIAXONE <=1 SENSITIVE Sensitive     CIPROFLOXACIN <=0.25 SENSITIVE Sensitive     GENTAMICIN <=1 SENSITIVE Sensitive     IMIPENEM <=0.25 SENSITIVE Sensitive     TRIMETH/SULFA <=20 SENSITIVE Sensitive     AMPICILLIN/SULBACTAM >=32 RESISTANT  Resistant     PIP/TAZO 8 SENSITIVE Sensitive     Extended ESBL NEGATIVE Sensitive     * ESCHERICHIA COLI     SIGNED:   Jerald Kief, MD  [facility] 01/29/2017, 4:50 PM  If 7PM-7AM, please contact night-coverage [email or web address] Password [email or web address]

## 2017-01-29 NOTE — Final Consult Note (Signed)
Consultant Final Sign-Off Note    Assessment/Final recommendations  [name] is a 61 y.o. male followed by [facility] for perianal abscess   Wound care (if applicable): keep incision site covered while draining.  Change daily and prn   Diet at discharge: per primary team   Activity at discharge: per primary team   Follow-up appointment:  Dr [name] in 3 wks   Pending results:  [facility]     Ordered   01/26/17 0822  Urinalysis, Routine w reflex microscopic  Once,   R     01/26/17 0826   01/26/17 0822  Urine culture  STAT,   STAT     01/26/17 0826       Medication recommendations:   Other recommendations: Sitz baths after BM's    Thank you for allowing [place] to participate in the care of your patient!  Please consult [place] again if you have further needs for your patient.  [name] C. 01/29/2017 7:51 AM    Subjective   Pain is improving  Objective  Vital signs in last 24 hours: Temp:  [98.6 F (37 C)-98.8 F (37.1 C)] 98.6 F (37 C) (10/14 0601) Pulse Rate:  [84-90] 88 (10/14 0601) Resp:  [18] 18 (10/14 0601) BP: (151-164)/(81-94) 163/93 (10/14 0601) SpO2:  [100 %] 100 % (10/14 0601)  General: NAD    Pertinent labs and Studies:  Recent Labs  01/27/17 0539 01/28/17 0437 01/29/17 0537  WBC 16.0* 20.6* 17.1*  HGB 10.7* 10.4* 12.6*  HCT 31.5* 30.6* 36.0*   BMET  Recent Labs  01/28/17 0437 01/29/17 0537  NA 136 137  K 4.3 3.8  CL 107 103  CO2 21* 23  GLUCOSE 169* 101*  BUN 16 15  CREATININE 1.14 1.16  CALCIUM 8.9 9.6   No results for input(s): LABURIN in the last 72 hours. Results for orders placed or performed during the hospital encounter of 01/26/17  Blood culture (routine x 2)     Status: None (Preliminary result)   Collection Time: 01/26/17  8:50 AM  Result Value Ref Range Status   Specimen Description BLOOD RIGHT FOREARM  Final   Special Requests   Final    BOTTLES DRAWN AEROBIC AND ANAEROBIC Blood Culture adequate  volume   Culture   Final    NO GROWTH 2 DAYS Performed at [facility], [address] [place], [place] [address]    Report Status PENDING  Incomplete  Blood culture (routine x 2)     Status: None (Preliminary result)   Collection Time: 01/26/17  9:04 AM  Result Value Ref Range Status   Specimen Description BLOOD RIGHT HAND  Final   Special Requests   Final    BOTTLES DRAWN AEROBIC AND ANAEROBIC Blood Culture results may not be optimal due to an inadequate volume of blood received in culture bottles   Culture   Final    NO GROWTH 2 DAYS Performed at [facility], [address], [place], [place] [address]    Report Status PENDING  Incomplete  Aerobic/Anaerobic Culture (surgical/deep wound)     Status: None (Preliminary result)   Collection Time: 01/26/17  3:11 PM  Result Value Ref Range Status   Specimen Description ABSCESS PERIRECTAL  Final   Special Requests NONE  Final   Gram Stain   Final    ABUNDANT WBC PRESENT, PREDOMINANTLY PMN MODERATE GRAM NEGATIVE RODS Performed at [facility], [address], [place], [place] [address]    Culture   Final    CULTURE REINCUBATED FOR BETTER GROWTH NO ANAEROBES ISOLATED; CULTURE IN PROGRESS FOR 5 DAYS    Report Status PENDING  Incomplete    Imaging: No results found.

## 2017-01-29 NOTE — Progress Notes (Signed)
Pharmacy Antibiotic Note  [name] is a 61 y.o. male admitted on 01/26/2017 with perirectal abscess.  Pharmacy has been consulted for vancomycin and zosyn dosing. Patient prescribed doxycycline and amox/clav 10/9 for abscess.  Presented to ED 10/11.  I&D performed 10/11. Patient developed lip swelling during zosyn infusion and was changed to aztreonam  Today, 01/29/2017  Day #4 antibiotics  Vancomycin trough this am = 13 mcg/mL (within goal 10-15 mcg/mL) for skin/soft tissue infection  OR culture from 10/11 grew E. Coli resistant to amp and amp/sulb  Plan:   Vancomycin  IV q12h - trough acceptable  Aztreonam 1gm IV q8h  Cultures have returned, suggest change to PO abx     Temp (24hrs), Avg:98.7 F (37.1 C), Min:98.6 F (37 C), Max:98.8 F (37.1 C)   Recent Labs Lab 01/26/17 0822 01/26/17 0835 01/27/17 0539 01/28/17 0437 01/29/17 0537  WBC 13.1*  --  16.0* 20.6* 17.1*  CREATININE 1.22  --  1.28* 1.14 1.16  LATICACIDVEN  --  1.3  --   --   --   VANCOTROUGH  --   --   --   --  13*    Estimated Creatinine Clearance: 66.5 mL/min (by C-G formula based on SCr of 1.16 mg/dL).    Allergies  Allergen Reactions  . Zosyn [Piperacillin Sod-Tazobactam So] Other (See Comments)    Lips swelling    Antimicrobials this admission:  Vanc 10/11>> Zosyn 10/11>> 10/12 Aztreonam 10/12 >>  Dose adjustments this admission:  10/14 0530 VT = 13 on 1gm IV q12h (prior to 6th dose)  Microbiology results:  10/11 surgical/deep wound: e. Coli (R to amp, amp/sulb) 10/11 BCx2>> NGTD 9/27 Stool culture: negative for Shiga, Campylobacter, Salmonella, or shigella 10/11 UCx:  ordered, not collected yet  Juliette Alcide, PharmD, BCPS.   Pager: [phone] 01/29/2017 11:00 AM

## 2017-01-29 NOTE — Progress Notes (Addendum)
Patient alert and oriented. Tolerated diet. D/C instruction and prescriptions given. All questions answered.

## 2017-01-31 ENCOUNTER — Other Ambulatory Visit: Payer: Self-pay | Admitting: Internal Medicine

## 2017-01-31 LAB — CULTURE, BLOOD (ROUTINE X 2)
Culture: NO GROWTH
Culture: NO GROWTH
Special Requests: ADEQUATE

## 2017-01-31 LAB — AEROBIC/ANAEROBIC CULTURE (SURGICAL/DEEP WOUND)

## 2017-01-31 LAB — AEROBIC/ANAEROBIC CULTURE W GRAM STAIN (SURGICAL/DEEP WOUND)

## 2017-01-31 MED ORDER — CIPROFLOXACIN HCL 500 MG PO TABS: 500.0000 mg | ORAL_TABLET | Freq: Two times a day (BID) | ORAL | 0 refills | Status: DC

## 2017-01-31 NOTE — Progress Notes (Signed)
Changed patient to cipro due to bactrim R on ecoli strain

## 2017-02-10 ENCOUNTER — Other Ambulatory Visit: Payer: Self-pay | Admitting: *Deleted

## 2017-02-10 MED ORDER — CIPROFLOXACIN HCL 500 MG PO TABS: 500.0000 mg | ORAL_TABLET | Freq: Two times a day (BID) | ORAL | 0 refills | Status: DC

## 2017-02-10 NOTE — Telephone Encounter (Signed)
Patient called and advised he was told by Drue SecondSnider to call the iffice on Monday 02/06/17 to have a Rx of Cipro sent in until his appointment 02/22/17 to see her. Rx sent to the pharmacy and message sent to the provider.

## 2017-02-22 ENCOUNTER — Encounter: Payer: Self-pay | Admitting: Internal Medicine

## 2017-02-22 ENCOUNTER — Ambulatory Visit (INDEPENDENT_AMBULATORY_CARE_PROVIDER_SITE_OTHER): Payer: Self-pay | Admitting: Internal Medicine

## 2017-02-22 VITALS — BP 147/86 | HR 79 | Temp 98.0°F | Wt 166.0 lb

## 2017-02-22 DIAGNOSIS — I10 Essential (primary) hypertension: Secondary | ICD-10-CM

## 2017-02-22 DIAGNOSIS — K219 Gastro-esophageal reflux disease without esophagitis: Secondary | ICD-10-CM

## 2017-02-22 DIAGNOSIS — K610 Anal abscess: Secondary | ICD-10-CM

## 2017-02-22 DIAGNOSIS — B20 Human immunodeficiency virus [HIV] disease: Secondary | ICD-10-CM

## 2017-02-22 MED ORDER — FAMOTIDINE 20 MG PO TABS: 20.0000 mg | ORAL_TABLET | Freq: Every day | ORAL | 0 refills | Status: DC | PRN

## 2017-02-22 MED ORDER — HYDROCODONE-ACETAMINOPHEN 5-325 MG PO TABS: 1.0000 | ORAL_TABLET | Freq: Four times a day (QID) | ORAL | 0 refills | Status: DC | PRN

## 2017-02-22 NOTE — Progress Notes (Signed)
Patient ID: [name], male   DOB: August 05, 1955, 61 y.o.   MRN: [id number]  HPI [name] is a 61yo M with well controlled hiv disease but recent had perianal abscess drained on 01/26/17, OR cultures grew ecoli ( 2 different isolates, one R to bactrim). He was initially discharged on bactrim but then changed to cipro treated with 7 d course of oral abtx but then noticed a new area that became tender and firm that spontaneously drained. He received another 14d course of cipro for which he has 2 days left of his abtx prescription  He has purulent drainage on his bandage that he changes on a daily basis.    still some soreness, he takes ibuprofen 200mg  as needed for pain which has not given him much relief  Outpatient Encounter Medications as of 02/22/2017  Medication Sig  . amLODipine (NORVASC) 10 MG tablet Take 1 tablet (10 mg total) by mouth daily.  . ciprofloxacin (CIPRO) 500 MG tablet Take 1 tablet (500 mg total) by mouth 2 (two) times daily.  . CRESTOR 10 MG tablet Take 10 mg by mouth at bedtime.   . dolutegravir (TIVICAY) 50 MG tablet Take 1 tablet (50 mg total) by mouth daily.  Marland Kitchen. emtricitabine-tenofovir AF (DESCOVY) 200-25 MG tablet Take 1 tablet by mouth daily.  . famotidine (PEPCID) 20 MG tablet Take 1 tablet (20 mg total) by mouth 2 (two) times daily.  Marland Kitchen. gabapentin (NEURONTIN) 300 MG capsule Take 1 capsule (300 mg total) by mouth 2 (two) times daily.  . hydrochlorothiazide (HYDRODIURIL) 25 MG tablet Take 1 tablet (25 mg total) by mouth daily.  . Ibuprofen-Diphenhydramine Cit (ADVIL PM) 200-38 MG TABS Take 2 tablets by mouth at bedtime as needed (for pain/sleep).  . predniSONE (DELTASONE) 5 MG tablet Take 1 tablet (5 mg total) by mouth daily with breakfast.  . megestrol (MEGACE) 400 MG/10ML suspension Take 10 mLs (400 mg total) by mouth daily. (Patient not taking: Reported on 01/26/2017)  . metoprolol tartrate (LOPRESSOR) 25 MG tablet Take 1 tablet (25 mg total) by mouth 2 (two)  times daily. (Patient not taking: Reported on 02/22/2017)  . sulfamethoxazole-trimethoprim (BACTRIM DS,SEPTRA DS) 800-160 MG tablet Take 1 tablet by mouth 2 (two) times daily. (Patient not taking: Reported on 02/22/2017)  . valACYclovir (VALTREX) 500 MG tablet Take 1 tablet (500 mg total) by mouth daily. (Patient not taking: Reported on 02/22/2017)   No facility-administered encounter medications on file as of 02/22/2017.      Patient Active Problem List   Diagnosis Date Noted  . Perirectal abscess 01/26/2017  . Diarrhea 01/12/2017  . Genital herpes 03/12/2014  . Penile wart 03/12/2014  . Urethritis, nongonococcal 03/06/2013  . HYPERLIPIDEMIA 05/02/2007  . WEIGHT LOSS 08/30/2006  . HIV (human immunodeficiency virus infection) (HCC) 05/30/2006  . Essential hypertension 05/30/2006  . DENTAL CARIES 05/30/2006  . METHICILLIN RESISTANT STAPHYLOCOCCUS AUREUS INFECTION 10/31/2004     Health Maintenance Due  Topic Date Due  . COLONOSCOPY  09/11/2006     Review of Systems Review of Systems  Constitutional: Negative for fever, chills, diaphoresis, activity change, appetite change, fatigue and unexpected weight change.  HENT: Negative for congestion, sore throat, rhinorrhea, sneezing, trouble swallowing and sinus pressure.  Eyes: Negative for photophobia and visual disturbance.  Respiratory: Negative for cough, chest tightness, shortness of breath, wheezing and stridor.  Cardiovascular: Negative for chest pain, palpitations and leg swelling.  Gastrointestinal: Negative for nausea, vomiting, abdominal pain, diarrhea, constipation, blood in stool, abdominal distention  and anal bleeding.  Genitourinary: Negative for dysuria, hematuria, flank pain and difficulty urinating.  Musculoskeletal: Negative for myalgias, back pain, joint swelling, arthralgias and gait problem.  Skin:+ buttock wound  Neurological: Negative for dizziness, tremors, weakness and light-headedness.  Hematological: Negative  for adenopathy. Does not bruise/bleed easily.  Psychiatric/Behavioral: Negative for behavioral problems, confusion, sleep disturbance, dysphoric mood, decreased concentration and agitation.    Physical Exam   BP (!) 147/86   Pulse 79   Temp 98 F (36.7 C) (Oral)   Wt 166 lb (75.3 kg)   BMI 22.51 kg/m   Physical Exam  Constitutional: He is oriented to person, place, and time. He appears well-developed and well-nourished. No distress.  HENT:  Mouth/Throat: Oropharynx is clear and moist. No oropharyngeal exudate.  gu = new small pinpoint drainage for a separate lesion that was I x d. +purulent drainage on bandage but not much expressed. No erythema. Slight induration Lymphadenopathy:  He has no cervical adenopathy.  Neurological: He is alert and oriented to person, place, and time.  Skin: Skin is warm and dry. No rash noted. No erythema.  Psychiatric: He has a normal mood and affect. His behavior is normal.    Lab Results  Component Value Date   CD4TCELL 23 (L) 01/12/2017   Lab Results  Component Value Date   CD4TABS 720 01/12/2017   CD4TABS 930 10/24/2016   CD4TABS 480 07/19/2016   Lab Results  Component Value Date   HIV1RNAQUANT <20 NOT DETECTED 01/12/2017   Lab Results  Component Value Date   HEPBSAB YES 06/12/2006   Lab Results  Component Value Date   LABRPR NON REAC 07/19/2016    CBC Lab Results  Component Value Date   WBC 17.1 (H) 01/29/2017   RBC 4.10 (L) 01/29/2017   HGB 12.6 (L) 01/29/2017   HCT 36.0 (L) 01/29/2017   PLT 508 (H) 01/29/2017   MCV 87.8 01/29/2017   MCH 30.7 01/29/2017   MCHC 35.0 01/29/2017   RDW 12.9 01/29/2017   LYMPHSABS 3.3 01/26/2017   MONOABS 0.9 01/26/2017   EOSABS 0.1 01/26/2017    BMET Lab Results  Component Value Date   NA 137 01/29/2017   K 3.8 01/29/2017   CL 103 01/29/2017   CO2 23 01/29/2017   GLUCOSE 101 (H) 01/29/2017   BUN 15 01/29/2017   CREATININE 1.16 01/29/2017   CALCIUM 9.6 01/29/2017   GFRNONAA >60  01/29/2017   GFRAA >60 01/29/2017    I have checked the registry to see if getting pain meds from other providers  Assessment and Plan  Anal abscess, draining = some induration still in the area. He sees general surgery tomorrow. Plan for him to finish out his abtx. We will see if he requires further I x D. Does not have surrounding erythema but appears tender to touch  Pain management = will have him increase his ibuprofen to 600-800mg  po q 6 h8hr. cna give him addn 10 tabs ofr norco  htn = continue with current regimen  gerd = will refill famotidine  hiv disease = continue on current regimen

## 2017-02-22 NOTE — Patient Instructions (Signed)
For pain management = you can take up to 4 tabs of ibuprofen at once on a FULL STOMACH every 8 hrs for pain management  I have also given you 10 tablets of vicodin to use for moderate-severe pain as needed. Do not drive if you are taking this medications.

## 2017-02-24 NOTE — Telephone Encounter (Signed)
Patient called to advise he sees the surgeon 02/27/17 but is still having drainage and pain and wants Dr Drue SecondSnider to call in antibiotics at least until visit next week. Advised will have to ask the doctor and give him a call back.

## 2017-03-01 ENCOUNTER — Encounter (HOSPITAL_BASED_OUTPATIENT_CLINIC_OR_DEPARTMENT_OTHER): Payer: Self-pay | Admitting: *Deleted

## 2017-03-01 ENCOUNTER — Other Ambulatory Visit: Payer: Self-pay

## 2017-03-01 ENCOUNTER — Ambulatory Visit: Payer: Self-pay | Admitting: Surgery

## 2017-03-01 NOTE — Progress Notes (Addendum)
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

## 2017-03-01 NOTE — Progress Notes (Signed)
Pt called back via phone to verify bowel prep.  Pt verbalized understanding to do one fleet enema tonight and one in am dos.

## 2017-03-02 ENCOUNTER — Ambulatory Visit (HOSPITAL_BASED_OUTPATIENT_CLINIC_OR_DEPARTMENT_OTHER): Admit: 2017-03-02 | Payer: Self-pay | Admitting: Surgery

## 2017-03-02 SURGERY — EXAM UNDER ANESTHESIA
Anesthesia: Monitor Anesthesia Care

## 2017-03-20 ENCOUNTER — Other Ambulatory Visit: Payer: Self-pay | Admitting: Internal Medicine

## 2017-03-20 DIAGNOSIS — K219 Gastro-esophageal reflux disease without esophagitis: Secondary | ICD-10-CM

## 2017-04-03 ENCOUNTER — Other Ambulatory Visit: Payer: Self-pay | Admitting: Internal Medicine

## 2017-04-13 ENCOUNTER — Encounter: Payer: Self-pay | Admitting: Surgery

## 2017-04-20 ENCOUNTER — Telehealth: Payer: Self-pay | Admitting: *Deleted

## 2017-04-20 NOTE — Telephone Encounter (Signed)
Patient called to advise that he has not had his next procedure as he is paying out of pocket (no insurance) and now almost has the money. He advised the site of his previous surgery is still draining a little and wants to know if Dr Drue SecondSnider will give him another Rx for Cipro and Gabapentin until he sees her 05/2017 and has the procedure done. Advised him will have to ask her and get back to him once she responds.

## 2017-04-21 ENCOUNTER — Other Ambulatory Visit: Payer: Self-pay | Admitting: Internal Medicine

## 2017-04-21 MED ORDER — GABAPENTIN 300 MG PO CAPS: 300.0000 mg | ORAL_CAPSULE | Freq: Two times a day (BID) | ORAL | 1 refills | Status: DC

## 2017-04-21 MED ORDER — CIPROFLOXACIN HCL 500 MG PO TABS: 500.0000 mg | ORAL_TABLET | Freq: Two times a day (BID) | ORAL | 0 refills | Status: DC

## 2017-04-21 NOTE — Telephone Encounter (Signed)
Done. Andre MondaySpoke to patient and gave him the plan

## 2017-05-29 ENCOUNTER — Encounter: Payer: Self-pay | Admitting: Internal Medicine

## 2017-05-29 ENCOUNTER — Ambulatory Visit (INDEPENDENT_AMBULATORY_CARE_PROVIDER_SITE_OTHER): Payer: Self-pay | Admitting: Internal Medicine

## 2017-05-29 VITALS — BP 126/89 | HR 106 | Temp 98.7°F | Wt 179.0 lb

## 2017-05-29 DIAGNOSIS — Z23 Encounter for immunization: Secondary | ICD-10-CM

## 2017-05-29 DIAGNOSIS — Z79899 Other long term (current) drug therapy: Secondary | ICD-10-CM

## 2017-05-29 DIAGNOSIS — B20 Human immunodeficiency virus [HIV] disease: Secondary | ICD-10-CM

## 2017-05-29 DIAGNOSIS — Z0000 Encounter for general adult medical examination without abnormal findings: Secondary | ICD-10-CM

## 2017-05-29 DIAGNOSIS — K610 Anal abscess: Secondary | ICD-10-CM

## 2017-05-29 MED ORDER — FLUCONAZOLE 200 MG PO TABS: 400.0000 mg | ORAL_TABLET | Freq: Every day | ORAL | 0 refills | Status: DC

## 2017-05-29 MED ORDER — CIPROFLOXACIN HCL 500 MG PO TABS: 500.0000 mg | ORAL_TABLET | Freq: Two times a day (BID) | ORAL | 0 refills | Status: DC

## 2017-05-29 NOTE — Progress Notes (Signed)
RFV: follow up for hiv disease  Patient ID: [name], male   DOB: 1955/11/01, 62 y.o.   MRN: [id number]  HPI [name] is a 62yo M with well controlled HIV disease, but recently requiring I x D for rectal abscess this winter. He has been off of abtx for several weeks but still having purulent drainage from prior anal abscess. He has not been back to see general surgery due to paying outstanding balance. Still has tenderness to incisional areas with milky,purulent drainage Outpatient Encounter Medications as of 05/29/2017  Medication Sig  . amLODipine (NORVASC) 10 MG tablet Take 1 tablet (10 mg total) by mouth daily.  . CRESTOR 10 MG tablet Take 10 mg by mouth at bedtime.   . dolutegravir (TIVICAY) 50 MG tablet Take 1 tablet (50 mg total) by mouth daily.  Marland Kitchen. emtricitabine-tenofovir AF (DESCOVY) 200-25 MG tablet Take 1 tablet by mouth daily.  . famotidine (PEPCID) 20 MG tablet TAKE 1 TABLET BY MOUTH DAILY AS NEEDED FOR HEARTBURN OR INDIGESTION.  Marland Kitchen. gabapentin (NEURONTIN) 300 MG capsule Take 1 capsule (300 mg total) by mouth 2 (two) times daily.  . hydrochlorothiazide (HYDRODIURIL) 25 MG tablet Take 1 tablet (25 mg total) by mouth daily.  Marland Kitchen. HYDROcodone-acetaminophen (NORCO/VICODIN) 5-325 MG tablet Take 1 tablet every 6 (six) hours as needed by mouth for moderate pain.  . Ibuprofen-Diphenhydramine Cit (ADVIL PM) 200-38 MG TABS Take 2 tablets by mouth at bedtime as needed (for pain/sleep).  . metoprolol tartrate (LOPRESSOR) 25 MG tablet Take 1 tablet (25 mg total) by mouth 2 (two) times daily.  . [DISCONTINUED] ciprofloxacin (CIPRO) 500 MG tablet Take 1 tablet (500 mg total) by mouth 2 (two) times daily.  . [DISCONTINUED] ciprofloxacin (CIPRO) 500 MG tablet Take 1 tablet (500 mg total) by mouth 2 (two) times daily.  . ciprofloxacin (CIPRO) 500 MG tablet Take 1 tablet (500 mg total) by mouth 2 (two) times daily.  . fluconazole (DIFLUCAN) 200 MG tablet Take 2 tablets (400 mg total) by mouth daily.     No facility-administered encounter medications on file as of 05/29/2017.      Patient Active Problem List   Diagnosis Date Noted  . Perirectal abscess 01/26/2017  . Diarrhea 01/12/2017  . Genital herpes 03/12/2014  . Penile wart 03/12/2014  . Urethritis, nongonococcal 03/06/2013  . HYPERLIPIDEMIA 05/02/2007  . WEIGHT LOSS 08/30/2006  . HIV (human immunodeficiency virus infection) (HCC) 05/30/2006  . Essential hypertension 05/30/2006  . DENTAL CARIES 05/30/2006  . METHICILLIN RESISTANT STAPHYLOCOCCUS AUREUS INFECTION 10/31/2004     Health Maintenance Due  Topic Date Due  . COLONOSCOPY  09/11/2006  . TETANUS/TDAP  05/01/2017     Review of Systems Soreness about buttocks, affected area. No fever, chills, nightsweats. 12 point ros is negative Physical Exam   BP 126/89   Pulse (!) 106   Temp 98.7 F (37.1 C) (Oral)   Wt 179 lb (81.2 kg)   BMI 24.28 kg/m   Physical Exam  Constitutional: He is oriented to person, place, and time. He appears well-developed and well-nourished. No distress.  HENT:  Mouth/Throat: Oropharynx is clear and moist. No oropharyngeal exudate.  Cardiovascular: Normal rate, regular rhythm and normal heart sounds. Exam reveals no gallop and no friction rub.  No murmur heard.  Pulmonary/Chest: Effort normal and breath sounds normal. No respiratory distress. He has no wheezes.  Abdominal: Soft. Bowel sounds are normal. He exhibits no distension. There is no tenderness.  Gu= left buttock has 2 incisional-1cm regions near  anus that still have milky drainage mild induration, no notable erythema Neurological: He is alert and oriented to person, place, and time.  Skin: Skin is warm and dry. No rash noted. No erythema.  Psychiatric: He has a normal mood and affect. His behavior is normal.    Lab Results  Component Value Date   CD4TCELL 23 (L) 01/12/2017   Lab Results  Component Value Date   CD4TABS 720 01/12/2017   CD4TABS 930 10/24/2016   CD4TABS 480  07/19/2016   Lab Results  Component Value Date   HIV1RNAQUANT <20 NOT DETECTED 01/12/2017   Lab Results  Component Value Date   HEPBSAB YES 06/12/2006   Lab Results  Component Value Date   LABRPR NON REAC 07/19/2016    CBC Lab Results  Component Value Date   WBC 17.1 (H) 01/29/2017   RBC 4.10 (L) 01/29/2017   HGB 12.6 (L) 01/29/2017   HCT 36.0 (L) 01/29/2017   PLT 508 (H) 01/29/2017   MCV 87.8 01/29/2017   MCH 30.7 01/29/2017   MCHC 35.0 01/29/2017   RDW 12.9 01/29/2017   LYMPHSABS 3.3 01/26/2017   MONOABS 0.9 01/26/2017   EOSABS 0.1 01/26/2017    BMET Lab Results  Component Value Date   NA 137 01/29/2017   K 3.8 01/29/2017   CL 103 01/29/2017   CO2 23 01/29/2017   GLUCOSE 101 (H) 01/29/2017   BUN 15 01/29/2017   CREATININE 1.16 01/29/2017   CALCIUM 9.6 01/29/2017   GFRNONAA >60 01/29/2017   GFRAA >60 01/29/2017      Assessment and Plan  hiv disease= plan on continuing his current regimen. Will check to labs today to see that he is still undetectable  Long term medication use of ART = will check kidney function. Appears stable. Can continue with current regimen  Polymicrobial abscess= appears draining, due to tenderness about the area suggests still inflammed. Will give - give 2 wk course of cipro plus fluconazole to treat polymicrobial infection  Health maintenance = will give tdap today  Relationship in discordant couple= Discussed that can provide PrEP for his male partner if she has concerns

## 2017-05-30 LAB — COMPLETE METABOLIC PANEL WITH GFR
AG RATIO: 1.1 (calc) (ref 1.0–2.5)
ALKALINE PHOSPHATASE (APISO): 91 U/L (ref 40–115)
ALT: 21 U/L (ref 9–46)
AST: 23 U/L (ref 10–35)
Albumin: 4 g/dL (ref 3.6–5.1)
BILIRUBIN TOTAL: 0.3 mg/dL (ref 0.2–1.2)
BUN/Creatinine Ratio: 13 (calc) (ref 6–22)
BUN: 17 mg/dL (ref 7–25)
CHLORIDE: 106 mmol/L (ref 98–110)
CO2: 26 mmol/L (ref 20–32)
Calcium: 9.7 mg/dL (ref 8.6–10.3)
Creat: 1.33 mg/dL — ABNORMAL HIGH (ref 0.70–1.25)
GFR, Est African American: 67 mL/min/{1.73_m2} (ref >=60)
GFR, Est Non African American: 58 mL/min/{1.73_m2} — ABNORMAL LOW (ref >=60)
Globulin: 3.8 g/dL (calc) — ABNORMAL HIGH (ref 1.9–3.7)
Glucose, Bld: 112 mg/dL — ABNORMAL HIGH (ref 65–99)
POTASSIUM: 4.1 mmol/L (ref 3.5–5.3)
Sodium: 141 mmol/L (ref 135–146)
Total Protein: 7.8 g/dL (ref 6.1–8.1)

## 2017-05-30 LAB — CBC WITH DIFFERENTIAL/PLATELET
Basophils Absolute: 62 cells/uL (ref 0–200)
Basophils Relative: 0.9 %
Eosinophils Absolute: 138 cells/uL (ref 15–500)
Eosinophils Relative: 2 %
HCT: 37 % — ABNORMAL LOW (ref 38.5–50.0)
Hemoglobin: 12.5 g/dL — ABNORMAL LOW (ref 13.2–17.1)
Lymphs Abs: 2284 cells/uL (ref 850–3900)
MCH: 30 pg (ref 27.0–33.0)
MCHC: 33.8 g/dL (ref 32.0–36.0)
MCV: 88.9 fL (ref 80.0–100.0)
MPV: 9.8 fL (ref 7.5–12.5)
Monocytes Relative: 11.5 %
NEUTROS PCT: 52.5 %
Neutro Abs: 3623 cells/uL (ref 1500–7800)
PLATELETS: 359 10*3/uL (ref 140–400)
RBC: 4.16 10*6/uL — ABNORMAL LOW (ref 4.20–5.80)
RDW: 12.6 % (ref 11.0–15.0)
TOTAL LYMPHOCYTE: 33.1 %
WBC mixed population: 794 cells/uL (ref 200–950)
WBC: 6.9 10*3/uL (ref 3.8–10.8)

## 2017-05-30 LAB — T-HELPER CELL (CD4) - (RCID CLINIC ONLY)
CD4 T CELL ABS: 670 /uL (ref 400–2700)
CD4 T CELL HELPER: 27 % — AB (ref 33–55)

## 2017-05-30 LAB — RPR: RPR Ser Ql: NONREACTIVE

## 2017-05-31 LAB — HIV-1 RNA QUANT-NO REFLEX-BLD
HIV 1 RNA QUANT: DETECTED {copies}/mL — AB
HIV-1 RNA QUANT, LOG: DETECTED {Log_copies}/mL — AB

## 2017-06-28 ENCOUNTER — Ambulatory Visit (INDEPENDENT_AMBULATORY_CARE_PROVIDER_SITE_OTHER): Payer: Self-pay | Admitting: Internal Medicine

## 2017-06-28 ENCOUNTER — Encounter: Payer: Self-pay | Admitting: Internal Medicine

## 2017-06-28 VITALS — BP 171/97 | HR 88 | Temp 98.0°F | Ht 72.0 in | Wt 176.0 lb

## 2017-06-28 DIAGNOSIS — K610 Anal abscess: Secondary | ICD-10-CM

## 2017-06-28 DIAGNOSIS — B20 Human immunodeficiency virus [HIV] disease: Secondary | ICD-10-CM

## 2017-06-28 DIAGNOSIS — A630 Anogenital (venereal) warts: Secondary | ICD-10-CM

## 2017-06-28 DIAGNOSIS — I10 Essential (primary) hypertension: Secondary | ICD-10-CM

## 2017-06-28 MED ORDER — IMIQUIMOD 5 % EX CREA: TOPICAL_CREAM | CUTANEOUS | 0 refills | Status: DC

## 2017-06-28 MED ORDER — GABAPENTIN 300 MG PO CAPS: 300.0000 mg | ORAL_CAPSULE | Freq: Two times a day (BID) | ORAL | 1 refills | Status: DC

## 2017-06-28 NOTE — Progress Notes (Signed)
RFV: follow up for hiv disease and rectal abscess  Patient ID: [name], male   DOB: 04/15/1956, 62 y.o.   MRN: [id number]  HPI [name] is a 62yo M with hiv disease and most recently getting care for rectal abscess requiring I x D but has not returned back to surgery for follow up due to outstanding balance. He was placed on oral abtx due to ongoing drainage purulence from wound but now he has completed course of abtx. has Less drainage but still tender in the area, and he also long standing plaque/wart lesions to shaft of penis that he would like addressed Outpatient Encounter Medications as of 06/28/2017  Medication Sig  . amLODipine (NORVASC) 10 MG tablet Take 1 tablet (10 mg total) by mouth daily.  . ciprofloxacin (CIPRO) 500 MG tablet Take 1 tablet (500 mg total) by mouth 2 (two) times daily.  . CRESTOR 10 MG tablet Take 10 mg by mouth at bedtime.   . dolutegravir (TIVICAY) 50 MG tablet Take 1 tablet (50 mg total) by mouth daily.  Marland Kitchen emtricitabine-tenofovir AF (DESCOVY) 200-25 MG tablet Take 1 tablet by mouth daily.  . famotidine (PEPCID) 20 MG tablet TAKE 1 TABLET BY MOUTH DAILY AS NEEDED FOR HEARTBURN OR INDIGESTION.  . fluconazole (DIFLUCAN) 200 MG tablet Take 2 tablets (400 mg total) by mouth daily.  Marland Kitchen gabapentin (NEURONTIN) 300 MG capsule Take 1 capsule (300 mg total) by mouth 2 (two) times daily.  . hydrochlorothiazide (HYDRODIURIL) 25 MG tablet Take 1 tablet (25 mg total) by mouth daily.  Marland Kitchen HYDROcodone-acetaminophen (NORCO/VICODIN) 5-325 MG tablet Take 1 tablet every 6 (six) hours as needed by mouth for moderate pain.  . Ibuprofen-Diphenhydramine Cit (ADVIL PM) 200-38 MG TABS Take 2 tablets by mouth at bedtime as needed (for pain/sleep).  . metoprolol tartrate (LOPRESSOR) 25 MG tablet Take 1 tablet (25 mg total) by mouth 2 (two) times daily.   No facility-administered encounter medications on file as of 06/28/2017.      Patient Active Problem List   Diagnosis Date Noted  .  Perirectal abscess 01/26/2017  . Diarrhea 01/12/2017  . Genital herpes 03/12/2014  . Penile wart 03/12/2014  . Urethritis, nongonococcal 03/06/2013  . HYPERLIPIDEMIA 05/02/2007  . WEIGHT LOSS 08/30/2006  . HIV (human immunodeficiency virus infection) (HCC) 05/30/2006  . Essential hypertension 05/30/2006  . DENTAL CARIES 05/30/2006  . METHICILLIN RESISTANT STAPHYLOCOCCUS AUREUS INFECTION 10/31/2004     Health Maintenance Due  Topic Date Due  . COLONOSCOPY  09/11/2006     Review of Systems Review of Systems  Constitutional: Negative for fever, chills, diaphoresis, activity change, appetite change, fatigue and unexpected weight change.  HENT: Negative for congestion, sore throat, rhinorrhea, sneezing, trouble swallowing and sinus pressure.  Eyes: Negative for photophobia and visual disturbance.  Respiratory: Negative for cough, chest tightness, shortness of breath, wheezing and stridor.  Cardiovascular: Negative for chest pain, palpitations and leg swelling.  Gastrointestinal: Negative for nausea, vomiting, abdominal pain, diarrhea, constipation, blood in stool, abdominal distention and anal bleeding.  Genitourinary: Negative for dysuria, hematuria, flank pain and difficulty urinating.  Musculoskeletal: Negative for myalgias, back pain, joint swelling, arthralgias and gait problem.  Skin: perirectal lesion still tender. And lesion to penis unchanged  Neurological: Negative for dizziness, tremors, weakness and light-headedness.  Hematological: Negative for adenopathy. Does not bruise/bleed easily.  Psychiatric/Behavioral: Negative for behavioral problems, confusion, sleep disturbance, dysphoric mood, decreased concentration and agitation.    Physical Exam   Temp 98 F (36.7 C)  Ht 6' (1.829 m)   Wt 176 lb (79.8 kg)   BMI 23.87 kg/m   Physical Exam  Constitutional: He is oriented to person, place, and time. He appears well-developed and well-nourished. No distress.  HENT:    Mouth/Throat: Oropharynx is clear and moist. No oropharyngeal exudate.  Cardiovascular: Normal rate, regular rhythm and normal heart sounds. Exam reveals no gallop and no friction rub.  No murmur heard.  Pulmonary/Chest: Effort normal and breath sounds normal. No respiratory distress. He has no wheezes.  Gu= right buttock/perirectal area that was I x D, has serous drainage slow to heal. Shaft of penis has large wart on shaft of penis Neurological: He is alert and oriented to person, place, and time.  Skin: Skin is warm and dry. No rash noted. No erythema.  Psychiatric: He has a normal mood and affect. His behavior is normal.    Lab Results  Component Value Date   CD4TCELL 27 (L) 05/29/2017   Lab Results  Component Value Date   CD4TABS 670 05/29/2017   CD4TABS 720 01/12/2017   CD4TABS 930 10/24/2016   Lab Results  Component Value Date   HIV1RNAQUANT <20 DETECTED (A) 05/29/2017   Lab Results  Component Value Date   HEPBSAB YES 06/12/2006   Lab Results  Component Value Date   LABRPR NON-REACTIVE 05/29/2017    CBC Lab Results  Component Value Date   WBC 6.9 05/29/2017   RBC 4.16 (L) 05/29/2017   HGB 12.5 (L) 05/29/2017   HCT 37.0 (L) 05/29/2017   PLT 359 05/29/2017   MCV 88.9 05/29/2017   MCH 30.0 05/29/2017   MCHC 33.8 05/29/2017   RDW 12.6 05/29/2017   LYMPHSABS 2,284 05/29/2017   MONOABS 0.9 01/26/2017   EOSABS 138 05/29/2017    BMET Lab Results  Component Value Date   NA 141 05/29/2017   K 4.1 05/29/2017   CL 106 05/29/2017   CO2 26 05/29/2017   GLUCOSE 112 (H) 05/29/2017   BUN 17 05/29/2017   CREATININE 1.33 (H) 05/29/2017   CALCIUM 9.7 05/29/2017   GFRNONAA 58 (L) 05/29/2017   GFRAA 67 05/29/2017      Assessment and Plan   htn = poorly controlled - since he did not take meds today. He is surprisingly asymptomatic. Will ask him to take his BP once he gets home and. Take religiously.Marland Kitchenevery day. And see him back on Monday for rn visit to check  his bp and see if need to adjust meds  Refill gabapentin  Penile wart = will give aldara cream to use, gave instructions to use. I suspect this will need surgical intervention. Will refer to urology  hiv disease= continue with taking meds daily. Well controlled  Rectal abscess = appears improved. Continue with daily dressing changes. No need for abtx at this time

## 2017-08-01 ENCOUNTER — Ambulatory Visit: Payer: Self-pay | Admitting: Internal Medicine

## 2017-08-08 ENCOUNTER — Other Ambulatory Visit: Payer: Self-pay

## 2017-08-08 ENCOUNTER — Encounter: Payer: Self-pay | Admitting: Internal Medicine

## 2017-08-08 DIAGNOSIS — B20 Human immunodeficiency virus [HIV] disease: Secondary | ICD-10-CM

## 2017-08-08 MED ORDER — DOLUTEGRAVIR SODIUM 50 MG PO TABS: 50.0000 mg | ORAL_TABLET | Freq: Every day | ORAL | 5 refills | Status: DC

## 2017-08-08 MED ORDER — EMTRICITABINE-TENOFOVIR AF 200-25 MG PO TABS: 1.0000 | ORAL_TABLET | Freq: Every day | ORAL | 5 refills | Status: DC

## 2017-08-30 ENCOUNTER — Ambulatory Visit (INDEPENDENT_AMBULATORY_CARE_PROVIDER_SITE_OTHER): Payer: Self-pay | Admitting: Internal Medicine

## 2017-08-30 ENCOUNTER — Encounter: Payer: Self-pay | Admitting: Internal Medicine

## 2017-08-30 VITALS — BP 127/74 | HR 76 | Temp 98.1°F | Wt 182.0 lb

## 2017-08-30 DIAGNOSIS — K610 Anal abscess: Secondary | ICD-10-CM

## 2017-08-30 DIAGNOSIS — B20 Human immunodeficiency virus [HIV] disease: Secondary | ICD-10-CM

## 2017-08-30 DIAGNOSIS — Z23 Encounter for immunization: Secondary | ICD-10-CM

## 2017-08-30 MED ORDER — PNEUMOCOCCAL 13-VAL CONJ VACC IM SUSP
0.5000 mL | INTRAMUSCULAR | Status: AC
  Administered 2017-08-30: 0.5 mL via INTRAMUSCULAR

## 2017-08-30 MED ORDER — TRAMADOL HCL 50 MG PO TABS: 50.0000 mg | ORAL_TABLET | Freq: Four times a day (QID) | ORAL | 0 refills | Status: DC | PRN

## 2017-08-30 NOTE — Progress Notes (Signed)
RFV: follow up for hiv disease  Patient ID: [name], male   DOB: 1955-10-28, 62 y.o.   MRN: [id number]  HPI [name] is a 62yo M with hiv disease, CD 4 count of 670/VL<20 had perirectal abscess I x D in late summer 2019- still has tissue defect for I x D of perirectal abscess.does not have sufficient funds for surgery of $800. Still has associated pain, takes ibuprofen and tylenol PRN. He wears a pad for serous like drainage. He states that it is still sore in the region. Has not had any recent abtx.  Currently only on norvasc - but BP well tolerated  Girlfriend is an Charity fundraiser, HIV negative  Outpatient Encounter Medications as of 08/30/2017  Medication Sig  . amLODipine (NORVASC) 10 MG tablet Take 1 tablet (10 mg total) by mouth daily.  . CRESTOR 10 MG tablet Take 10 mg by mouth at bedtime.   . dolutegravir (TIVICAY) 50 MG tablet Take 1 tablet (50 mg total) by mouth daily.  Marland Kitchen emtricitabine-tenofovir AF (DESCOVY) 200-25 MG tablet Take 1 tablet by mouth daily.  . famotidine (PEPCID) 20 MG tablet TAKE 1 TABLET BY MOUTH DAILY AS NEEDED FOR HEARTBURN OR INDIGESTION.  . fluconazole (DIFLUCAN) 200 MG tablet Take 2 tablets (400 mg total) by mouth daily.  Marland Kitchen gabapentin (NEURONTIN) 300 MG capsule Take 1 capsule (300 mg total) by mouth 2 (two) times daily.  . hydrochlorothiazide (HYDRODIURIL) 25 MG tablet Take 1 tablet (25 mg total) by mouth daily.  Marland Kitchen HYDROcodone-acetaminophen (NORCO/VICODIN) 5-325 MG tablet Take 1 tablet every 6 (six) hours as needed by mouth for moderate pain.  . Ibuprofen-Diphenhydramine Cit (ADVIL PM) 200-38 MG TABS Take 2 tablets by mouth at bedtime as needed (for pain/sleep).  . imiquimod (ALDARA) 5 % cream Apply topically 3 (three) times a week. Apply at bedtime on affected area and wash off in the morning  . metoprolol tartrate (LOPRESSOR) 25 MG tablet Take 1 tablet (25 mg total) by mouth 2 (two) times daily.  . ciprofloxacin (CIPRO) 500 MG tablet Take 1 tablet (500 mg total)  by mouth 2 (two) times daily. (Patient not taking: Reported on 06/28/2017)   No facility-administered encounter medications on file as of 08/30/2017.      Patient Active Problem List   Diagnosis Date Noted  . Perirectal abscess 01/26/2017  . Diarrhea 01/12/2017  . Genital herpes 03/12/2014  . Penile wart 03/12/2014  . Urethritis, nongonococcal 03/06/2013  . HYPERLIPIDEMIA 05/02/2007  . WEIGHT LOSS 08/30/2006  . HIV (human immunodeficiency virus infection) (HCC) 05/30/2006  . Essential hypertension 05/30/2006  . DENTAL CARIES 05/30/2006  . METHICILLIN RESISTANT STAPHYLOCOCCUS AUREUS INFECTION 10/31/2004     Health Maintenance Due  Topic Date Due  . COLONOSCOPY  09/11/2006    Social History   Tobacco Use  . Smoking status: Former Smoker    Packs/day: 0.25    Years: 6.00    Pack years: 1.50    Types: Cigarettes  . Smokeless tobacco: Never Used  . Tobacco comment: quit 20 yrs ago  Substance Use Topics  . Alcohol use: Yes    Alcohol/week: 0.0 oz    Comment: occ  . Drug use: Yes    Frequency: 3.0 times per week    Types: Marijuana    Comment: occ marijuana   Review of Systems Buttock pain. Otherwise 12 point ros Physical Exam   BP 127/74   Pulse 76   Temp 98.1 F (36.7 C) (Oral)   Wt 182 lb (  82.6 kg)   BMI 24.68 kg/m   Physical Exam  Constitutional: He is oriented to person, place, and time. He appears well-developed and well-nourished. No distress.  HENT:  Mouth/Throat: Oropharynx is clear and moist. No oropharyngeal exudate.  Cardiovascular: Normal rate, regular rhythm and normal heart sounds. Exam reveals no gallop and no friction rub.  No murmur heard.  Pulmonary/Chest: Effort normal and breath sounds normal. No respiratory distress. He has no wheezes.  gu = 2 areas soft tissue good granualtion bed but yellowish exudate on pad. No induration appreciated. Neurological: He is alert and oriented to person, place, and time.  Skin: Skin is warm and dry. No rash  noted. No erythema.  Psychiatric: He has a normal mood and affect. His behavior is normal.    Lab Results  Component Value Date   CD4TCELL 27 (L) 05/29/2017   Lab Results  Component Value Date   CD4TABS 670 05/29/2017   CD4TABS 720 01/12/2017   CD4TABS 930 10/24/2016   Lab Results  Component Value Date   HIV1RNAQUANT <20 DETECTED (A) 05/29/2017   Lab Results  Component Value Date   HEPBSAB YES 06/12/2006   Lab Results  Component Value Date   LABRPR NON-REACTIVE 05/29/2017    CBC Lab Results  Component Value Date   WBC 6.9 05/29/2017   RBC 4.16 (L) 05/29/2017   HGB 12.5 (L) 05/29/2017   HCT 37.0 (L) 05/29/2017   PLT 359 05/29/2017   MCV 88.9 05/29/2017   MCH 30.0 05/29/2017   MCHC 33.8 05/29/2017   RDW 12.6 05/29/2017   LYMPHSABS 2,284 05/29/2017   MONOABS 0.9 01/26/2017   EOSABS 138 05/29/2017    BMET Lab Results  Component Value Date   NA 141 05/29/2017   K 4.1 05/29/2017   CL 106 05/29/2017   CO2 26 05/29/2017   GLUCOSE 112 (H) 05/29/2017   BUN 17 05/29/2017   CREATININE 1.33 (H) 05/29/2017   CALCIUM 9.7 05/29/2017   GFRNONAA 58 (L) 05/29/2017   GFRAA 67 05/29/2017      Assessment and Plan  buttock pain = will give a few tabs of tramadol  Hx of perirectal abcess with tissue defect =Refer to [facility] to see if needs further I x D vs. Means to have this region heal after prolonged period that has not currently healed. Unclear if he has fistulous tract that prevents from healing.  hiv disease= well controlled  Health maintenance = prevnar 13

## 2017-09-03 ENCOUNTER — Other Ambulatory Visit: Payer: Self-pay | Admitting: Internal Medicine

## 2017-09-03 DIAGNOSIS — B20 Human immunodeficiency virus [HIV] disease: Secondary | ICD-10-CM

## 2017-09-19 ENCOUNTER — Other Ambulatory Visit: Payer: Self-pay | Admitting: Internal Medicine

## 2017-09-19 DIAGNOSIS — I10 Essential (primary) hypertension: Secondary | ICD-10-CM

## 2017-09-19 DIAGNOSIS — B20 Human immunodeficiency virus [HIV] disease: Secondary | ICD-10-CM

## 2017-09-20 ENCOUNTER — Other Ambulatory Visit: Payer: Self-pay | Admitting: Pharmacist

## 2017-09-20 DIAGNOSIS — I10 Essential (primary) hypertension: Secondary | ICD-10-CM

## 2017-09-20 MED ORDER — HYDROCHLOROTHIAZIDE 25 MG PO TABS: 25.0000 mg | ORAL_TABLET | Freq: Every day | ORAL | 5 refills | Status: DC

## 2017-10-06 ENCOUNTER — Telehealth: Payer: Self-pay

## 2017-10-06 NOTE — Telephone Encounter (Signed)
Pt called today stating he has not heard from [facility] about an appt; will reach out to referral coordinator Timmothy Soursshley Rehner to check on the status of the referral.  PT also stated he has been having some "leakage" since his surgery in October and would like a [facility] [address]. Will route message to Dr. Drue SecondSnider to see if it is okay to send refill for Cipro for pt. Lorenso CourierJose L Jood Retana, [place]MA

## 2017-10-06 NOTE — Telephone Encounter (Signed)
Can he be seen by greg or cassie early next week. I can't just keep giving abtx until we see what is going on with him.sorry

## 2017-10-09 ENCOUNTER — Encounter: Payer: Self-pay | Admitting: Infectious Diseases

## 2017-10-09 ENCOUNTER — Ambulatory Visit (INDEPENDENT_AMBULATORY_CARE_PROVIDER_SITE_OTHER): Payer: Self-pay | Admitting: Infectious Diseases

## 2017-10-09 ENCOUNTER — Ambulatory Visit: Payer: Self-pay

## 2017-10-09 DIAGNOSIS — K611 Rectal abscess: Secondary | ICD-10-CM

## 2017-10-09 DIAGNOSIS — Z21 Asymptomatic human immunodeficiency virus [HIV] infection status: Secondary | ICD-10-CM

## 2017-10-09 NOTE — Assessment & Plan Note (Signed)
HIV 1 RNA Quant (copies/mL)  Date Value  05/29/2017 <20 DETECTED (A)  01/12/2017 <20 NOT DETECTED  10/10/2016 60 (H)   CD4 T Cell Abs (/uL)  Date Value  05/29/2017 670  01/12/2017 720  10/24/2016 930   Excellent adherence on his medications with last VL < 20 and CD4 670 back in February. He has an upcoming appointment with Dr. Drue SecondSnider in August. Has all medications readily accessible.

## 2017-10-09 NOTE — Patient Instructions (Addendum)
Continue doing what you are doing to care for the area. This does not look to be infected today.   I want you to call back if you experience any redness, swelling/firmness surrounding the site, worsening pain.   Will see you back as needed for this. Follow up with Dr. Drue SecondSnider as you are currently scheduled.

## 2017-10-09 NOTE — Assessment & Plan Note (Signed)
Previous surgery in October 2018 - this area has appeared to healed over but hyper-granulation tissue present likely in the setting of overly wet wound environment during healing process. This does not appear to be infected today. I have recommended to continue with local wound care and keeping area clean. [facility] - I have provided him with the number to their general surgery program today to contact for an appointment. Follow up here as needed for changes to reassess antibiotic need. Hopefully he can get in with surgery soon to resolve this issue.

## 2017-10-09 NOTE — Progress Notes (Signed)
Patient: Andre EvesDwight A Case  DOB: 22-Mar-1956 MRN: [id number] PCP: Judyann MunsonSnider, Cynthia, MD   Chief Complaint  Patient presents with  . Cyst    left butocks      Patient Active Problem List   Diagnosis Date Noted  . Perirectal abscess 01/26/2017  . Diarrhea 01/12/2017  . Genital herpes 03/12/2014  . Penile wart 03/12/2014  . Urethritis, nongonococcal 03/06/2013  . HYPERLIPIDEMIA 05/02/2007  . WEIGHT LOSS 08/30/2006  . HIV (human immunodeficiency virus infection) (HCC) 05/30/2006  . Essential hypertension 05/30/2006  . DENTAL CARIES 05/30/2006  . METHICILLIN RESISTANT STAPHYLOCOCCUS AUREUS INFECTION 10/31/2004     Subjective:  [name] is a 62 y.o. M with HIV disease and follows in care here with Dr. Drue SecondSnider. He is well controlled on his Tivicay and Descovy with last CD4 670, VL < 20. He last saw Dr. Drue SecondSnider in 08/30/17 where he was still troubled from a perirectal abscess; [facility] but he has insufficient funds for surgery. He has taken cipro for this in the past. Last culture I see is from October 2018 after drainage where he had 2 strains of E. Coli (R-Amp, Amp-Sulb; R-amp, I-amp sulb --> also varying MIC to cefazolin 16 and < 4 respectively). He called the office on 10/06/17 to request refill on Cipro as he was experiencing more drainage/leakage from this site. This site has never fully experienced healing since his surgery. He has had more drainage but pain has not increased, no swelling or redness per his girlfriend's assessment. No fevers or chills. He continues doing sitz baths and keeps it very clean with maxi-pad dressings. He [facility] not heard from [facility] about referral.   Review of Systems  Constitutional: Negative for chills, fever, malaise/fatigue and weight loss.  Respiratory: Negative for cough.   Cardiovascular: Negative for chest pain.  Gastrointestinal: Negative for abdominal pain, blood in stool, diarrhea and vomiting.       Clear/cloudy drainage  from previous peri-rectal surgery site.   Genitourinary: Negative for dysuria.  Musculoskeletal: Negative for myalgias.  Skin: Negative for rash.  Neurological: Negative for dizziness, tingling and headaches.  Psychiatric/Behavioral: Negative for depression.    Past Medical History:  Diagnosis Date  . HIV infection (HCC)   . Hypertension     Outpatient Medications Prior to Visit  Medication Sig Dispense Refill  . amLODipine (NORVASC) 10 MG tablet TAKE 1 TABLET(10 MG) BY MOUTH DAILY 30 tablet 5  . CRESTOR 10 MG tablet Take 10 mg by mouth at bedtime.   4  . DESCOVY 200-25 MG tablet TAKE 1 TABLET BY MOUTH DAILY 30 tablet 5  . famotidine (PEPCID) 20 MG tablet TAKE 1 TABLET BY MOUTH DAILY AS NEEDED FOR HEARTBURN OR INDIGESTION. 30 tablet 2  . fluconazole (DIFLUCAN) 200 MG tablet Take 2 tablets (400 mg total) by mouth daily. 28 tablet 0  . gabapentin (NEURONTIN) 300 MG capsule Take 1 capsule (300 mg total) by mouth 2 (two) times daily. 60 capsule 1  . hydrochlorothiazide (HYDRODIURIL) 25 MG tablet Take 1 tablet (25 mg total) by mouth daily. 30 tablet 5  . HYDROcodone-acetaminophen (NORCO/VICODIN) 5-325 MG tablet Take 1 tablet every 6 (six) hours as needed by mouth for moderate pain. 10 tablet 0  . Ibuprofen-Diphenhydramine Cit (ADVIL PM) 200-38 MG TABS Take 2 tablets by mouth at bedtime as needed (for pain/sleep).    . imiquimod (ALDARA) 5 % cream Apply topically 3 (three) times a week. Apply at bedtime on affected  area and wash off in the morning 24 each 0  . metoprolol tartrate (LOPRESSOR) 25 MG tablet Take 1 tablet (25 mg total) by mouth 2 (two) times daily. 60 tablet 0  . TIVICAY 50 MG tablet TAKE 1 TABLET(50 MG) BY MOUTH DAILY 30 tablet 5  . traMADol (ULTRAM) 50 MG tablet Take 1 tablet (50 mg total) by mouth every 6 (six) hours as needed for moderate pain. 30 tablet 0  . ciprofloxacin (CIPRO) 500 MG tablet Take 1 tablet (500 mg total) by mouth 2 (two) times daily. (Patient not taking:  Reported on 06/28/2017) 28 tablet 0   No facility-administered medications prior to visit.      Allergies  Allergen Reactions  . Zosyn [Piperacillin Sod-Tazobactam So] Other (See Comments)    Lips swelling    Social History   Tobacco Use  . Smoking status: Former Smoker    Packs/day: 0.25    Years: 6.00    Pack years: 1.50    Types: Cigarettes  . Smokeless tobacco: Never Used  . Tobacco comment: quit 20 yrs ago  Substance Use Topics  . Alcohol use: Yes    Alcohol/week: 0.0 oz    Comment: occ  . Drug use: Yes    Frequency: 3.0 times per week    Types: Marijuana    Comment: occ marijuana    Family History  Problem Relation Age of Onset  . Diabetes Mother   . Hypertension Mother     Objective:   Vitals:   10/09/17 1025  BP: (!) 141/89  Pulse: 87  Resp: 16  Temp: 98 F (36.7 C)  TempSrc: Oral  SpO2: 100%  Weight: 175 lb (79.4 kg)  Height: 6' (1.829 m)   Body mass index is 23.73 kg/m.  Physical Exam  Constitutional: He is oriented to person, place, and time.  Seated comfortably on chair today.   HENT:  Mouth/Throat: No oral lesions. Normal dentition. No dental caries.  Cardiovascular: Normal rate, regular rhythm and normal heart sounds.  Pulmonary/Chest: Effort normal and breath sounds normal.  Abdominal: Soft. He exhibits no distension. There is no tenderness.  Genitourinary:     Lymphadenopathy:    He has no cervical adenopathy.       Right: No inguinal adenopathy present.       Left: No inguinal adenopathy present.  Neurological: He is alert and oriented to person, place, and time.  Skin: Skin is warm and dry. Capillary refill takes less than 2 seconds. No rash noted.  Vitals reviewed.  Lab Results: Lab Results  Component Value Date   WBC 6.9 05/29/2017   HGB 12.5 (L) 05/29/2017   HCT 37.0 (L) 05/29/2017   MCV 88.9 05/29/2017   PLT 359 05/29/2017    Lab Results  Component Value Date   CREATININE 1.33 (H) 05/29/2017   BUN 17 05/29/2017    NA 141 05/29/2017   K 4.1 05/29/2017   CL 106 05/29/2017   CO2 26 05/29/2017    Lab Results  Component Value Date   ALT 21 05/29/2017   AST 23 05/29/2017   ALKPHOS 80 01/26/2017   BILITOT 0.3 05/29/2017     Assessment & Plan:   Problem List Items Addressed This Visit      Other   Perirectal abscess    Previous surgery in October 2018 - this area has appeared to healed over but hyper-granulation tissue present likely in the setting of overly wet wound environment during healing process. This does not appear  to be infected today. I have recommended to continue with local wound care and keeping area clean. [facility] paperwork - I have provided him with the number to their general surgery program today to contact for an appointment. Follow up here as needed for changes to reassess antibiotic need. Hopefully he can get in with surgery soon to resolve this issue.       HIV (human immunodeficiency virus infection) (HCC)    HIV 1 RNA Quant (copies/mL)  Date Value  05/29/2017 <20 DETECTED (A)  01/12/2017 <20 NOT DETECTED  10/10/2016 60 (H)   CD4 T Cell Abs (/uL)  Date Value  05/29/2017 670  01/12/2017 720  10/24/2016 930   Excellent adherence on his medications with last VL < 20 and CD4 670 back in February. He has an upcoming appointment with Dr. Drue Second in August. Has all medications readily accessible.         Rexene Alberts, MSN, NP-C [facility] Pager: [phone] Office: [phone]  10/09/17  11:00 AM

## 2017-10-20 ENCOUNTER — Telehealth: Payer: Self-pay | Admitting: *Deleted

## 2017-10-20 NOTE — Telephone Encounter (Signed)
Patietn left message in traige asking for refill of tramadol until his surgery. Last written 50 mg #30 5/15. RN [facility]. Please advise on tramadol refill. Andree CossHowell, Kalisi Bevill M

## 2017-10-20 NOTE — Telephone Encounter (Signed)
RN received call from [place] at [facility] asking for clarification as to why [name] was trying to schedule an [facility].  Per chart, patient was referred in May for surgical evaluation of perirectal abscess. Per [place], no records available in their system and basic demographic information was incorrect. RN resent last 2 office notes, labs, current demographics to [phone]. Andree CossHowell, Kimm Ungaro M, RN

## 2017-11-02 ENCOUNTER — Other Ambulatory Visit: Payer: Self-pay | Admitting: Behavioral Health

## 2017-11-02 NOTE — Telephone Encounter (Signed)
Patient called requesting refill for Tramadol.  Patient states he has extreme perirectal pain.  Patient states he will see a surgeon November 17, 2017 but is very uncomfortable.  Patient rates pain 10/10.  Informed patient Dr. Drue SecondSnider is currently out of the office and would have to check with her to see if a refill is appropriate at this time. Angeline SlimAshley Kynzleigh Bandel RN

## 2017-11-06 ENCOUNTER — Other Ambulatory Visit: Payer: Self-pay | Admitting: Behavioral Health

## 2017-11-06 DIAGNOSIS — K611 Rectal abscess: Secondary | ICD-10-CM

## 2017-11-06 MED ORDER — TRAMADOL HCL 50 MG PO TABS: 50.0000 mg | ORAL_TABLET | Freq: Four times a day (QID) | ORAL | 0 refills | Status: DC | PRN

## 2017-11-06 NOTE — Telephone Encounter (Signed)
Can have a refill of tramadol for him. I would repeat the RX which was last given to him. Thank you.

## 2017-11-06 NOTE — Telephone Encounter (Signed)
Tramadol 50mg  Every 6 hours prn for moderate pain called into [facility] on [address].  Patient notified. Angeline SlimAshley Arno Cullers RN

## 2017-11-17 DIAGNOSIS — K603 Anal fistula, unspecified: Secondary | ICD-10-CM | POA: Insufficient documentation

## 2017-11-20 ENCOUNTER — Other Ambulatory Visit: Payer: Self-pay

## 2017-11-21 ENCOUNTER — Other Ambulatory Visit: Payer: Self-pay

## 2017-12-04 ENCOUNTER — Ambulatory Visit (INDEPENDENT_AMBULATORY_CARE_PROVIDER_SITE_OTHER): Payer: Self-pay | Admitting: Internal Medicine

## 2017-12-04 ENCOUNTER — Encounter: Payer: Self-pay | Admitting: Internal Medicine

## 2017-12-04 ENCOUNTER — Other Ambulatory Visit: Payer: Self-pay

## 2017-12-04 VITALS — BP 157/97 | HR 79 | Temp 98.0°F | Ht 72.0 in | Wt 176.0 lb

## 2017-12-04 DIAGNOSIS — N183 Chronic kidney disease, stage 3 unspecified: Secondary | ICD-10-CM

## 2017-12-04 DIAGNOSIS — K604 Rectal fistula: Secondary | ICD-10-CM

## 2017-12-04 DIAGNOSIS — I10 Essential (primary) hypertension: Secondary | ICD-10-CM

## 2017-12-04 DIAGNOSIS — B20 Human immunodeficiency virus [HIV] disease: Secondary | ICD-10-CM

## 2017-12-04 DIAGNOSIS — K611 Rectal abscess: Secondary | ICD-10-CM

## 2017-12-04 MED ORDER — GABAPENTIN 300 MG PO CAPS: 300.0000 mg | ORAL_CAPSULE | Freq: Two times a day (BID) | ORAL | 3 refills | Status: DC

## 2017-12-04 MED ORDER — TRAMADOL HCL 50 MG PO TABS: 50.0000 mg | ORAL_TABLET | Freq: Four times a day (QID) | ORAL | 0 refills | Status: DC | PRN

## 2017-12-04 NOTE — Progress Notes (Signed)
RFV: follow up for hiv disease  Patient ID: [name], male   DOB: 01/23/1956, 62 y.o.   MRN: [id number]  HPI [name] is a 62yo M with hiv disease,CD 4 count of670/VL<20 in Feb, hx of perirectal abscess in Fall of 2018 but this year complicated by poorly healing perianal draining sinus. He was able to see surgery at [facility] - who agree for repairing fistula. Uses tramadol and ibuprofen for pain. He doesn't have current date for repair. Colonoscopy in scheduled at [facility] for mid oct.  Outpatient Encounter Medications as of 12/04/2017  Medication Sig  . amLODipine (NORVASC) 10 MG tablet TAKE 1 TABLET(10 MG) BY MOUTH DAILY  . CRESTOR 10 MG tablet Take 10 mg by mouth at bedtime.   . DESCOVY 200-25 MG tablet TAKE 1 TABLET BY MOUTH DAILY  . famotidine (PEPCID) 20 MG tablet TAKE 1 TABLET BY MOUTH DAILY AS NEEDED FOR HEARTBURN OR INDIGESTION.  . hydrochlorothiazide (HYDRODIURIL) 25 MG tablet Take 1 tablet (25 mg total) by mouth daily.  . imiquimod (ALDARA) 5 % cream Apply topically 3 (three) times a week. Apply at bedtime on affected area and wash off in the morning  . TIVICAY 50 MG tablet TAKE 1 TABLET(50 MG) BY MOUTH DAILY  . traMADol (ULTRAM) 50 MG tablet Take 1 tablet (50 mg total) by mouth every 6 (six) hours as needed for moderate pain.  Marland Kitchen. gabapentin (NEURONTIN) 300 MG capsule Take 1 capsule (300 mg total) by mouth 2 (two) times daily. (Patient not taking: Reported on 12/04/2017)  . [DISCONTINUED] ciprofloxacin (CIPRO) 500 MG tablet Take 1 tablet (500 mg total) by mouth 2 (two) times daily. (Patient not taking: Reported on 06/28/2017)  . [DISCONTINUED] fluconazole (DIFLUCAN) 200 MG tablet Take 2 tablets (400 mg total) by mouth daily.  . [DISCONTINUED] HYDROcodone-acetaminophen (NORCO/VICODIN) 5-325 MG tablet Take 1 tablet every 6 (six) hours as needed by mouth for moderate pain.  . [DISCONTINUED] Ibuprofen-Diphenhydramine Cit (ADVIL PM) 200-38 MG TABS Take 2 tablets by mouth at bedtime as  needed (for pain/sleep).  . [DISCONTINUED] metoprolol tartrate (LOPRESSOR) 25 MG tablet Take 1 tablet (25 mg total) by mouth 2 (two) times daily. (Patient not taking: Reported on 12/04/2017)   No facility-administered encounter medications on file as of 12/04/2017.      Patient Active Problem List   Diagnosis Date Noted  . Perirectal abscess 01/26/2017  . Diarrhea 01/12/2017  . Genital herpes 03/12/2014  . Penile wart 03/12/2014  . Urethritis, nongonococcal 03/06/2013  . HYPERLIPIDEMIA 05/02/2007  . WEIGHT LOSS 08/30/2006  . HIV (human immunodeficiency virus infection) (HCC) 05/30/2006  . Essential hypertension 05/30/2006  . DENTAL CARIES 05/30/2006  . METHICILLIN RESISTANT STAPHYLOCOCCUS AUREUS INFECTION 10/31/2004     Health Maintenance Due  Topic Date Due  . COLONOSCOPY  09/11/2006  . INFLUENZA VACCINE  11/16/2017     Review of Systems Perirectal pain associated with fistula. 12 point ros is otherwise negative Physical Exam   BP (!) 157/97   Pulse 79   Temp 98 F (36.7 C) (Oral)   Ht 6' (1.829 m)   Wt 176 lb (79.8 kg)   BMI 23.87 kg/m  Physical Exam  Constitutional: He is oriented to person, place, and time. He appears well-developed and well-nourished. No distress.  HENT:  Mouth/Throat: Oropharynx is clear and moist. No oropharyngeal exudate.  Cardiovascular: Normal rate, regular rhythm and normal heart sounds. Exam reveals no gallop and no friction rub.  No murmur heard.  Pulmonary/Chest: Effort normal and breath  sounds normal. No respiratory distress. He has no wheezes.  Abdominal: Soft. Bowel sounds are normal. He exhibits no distension. There is no tenderness.  Lymphadenopathy:  He has no cervical adenopathy.  Neurological: He is alert and oriented to person, place, and time.  Skin: Skin is warm and dry. No rash noted. No erythema.  Psychiatric: He has a normal mood and affect. His behavior is normal.      Lab Results  Component Value Date   CD4TCELL 27  (L) 05/29/2017   Lab Results  Component Value Date   CD4TABS 670 05/29/2017   CD4TABS 720 01/12/2017   CD4TABS 930 10/24/2016   Lab Results  Component Value Date   HIV1RNAQUANT <20 DETECTED (A) 05/29/2017   Lab Results  Component Value Date   HEPBSAB YES 06/12/2006   Lab Results  Component Value Date   LABRPR NON-REACTIVE 05/29/2017    CBC Lab Results  Component Value Date   WBC 6.9 05/29/2017   RBC 4.16 (L) 05/29/2017   HGB 12.5 (L) 05/29/2017   HCT 37.0 (L) 05/29/2017   PLT 359 05/29/2017   MCV 88.9 05/29/2017   MCH 30.0 05/29/2017   MCHC 33.8 05/29/2017   RDW 12.6 05/29/2017   LYMPHSABS 2,284 05/29/2017   MONOABS 0.9 01/26/2017   EOSABS 138 05/29/2017    BMET Lab Results  Component Value Date   NA 141 05/29/2017   K 4.1 05/29/2017   CL 106 05/29/2017   CO2 26 05/29/2017   GLUCOSE 112 (H) 05/29/2017   BUN 17 05/29/2017   CREATININE 1.33 (H) 05/29/2017   CALCIUM 9.7 05/29/2017   GFRNONAA 58 (L) 05/29/2017   GFRAA 67 05/29/2017      Assessment and Plan   hiv disease = continue on current regimen of tivicay/descovy. 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

## 2017-12-05 LAB — T-HELPER CELL (CD4) - (RCID CLINIC ONLY)
CD4 % Helper T Cell: 27 % — ABNORMAL LOW (ref 33–55)
CD4 T CELL ABS: 710 /uL (ref 400–2700)

## 2017-12-06 LAB — COMPLETE METABOLIC PANEL WITH GFR
AG Ratio: 1.1 (calc) (ref 1.0–2.5)
ALT: 16 U/L (ref 9–46)
AST: 17 U/L (ref 10–35)
Albumin: 4.1 g/dL (ref 3.6–5.1)
Alkaline phosphatase (APISO): 92 U/L (ref 40–115)
BUN / CREAT RATIO: 11 (calc) (ref 6–22)
BUN: 15 mg/dL (ref 7–25)
CALCIUM: 9.7 mg/dL (ref 8.6–10.3)
CO2: 28 mmol/L (ref 20–32)
Chloride: 103 mmol/L (ref 98–110)
Creat: 1.32 mg/dL — ABNORMAL HIGH (ref 0.70–1.25)
GFR, EST AFRICAN AMERICAN: 67 mL/min/{1.73_m2} (ref >=60)
GFR, EST NON AFRICAN AMERICAN: 58 mL/min/{1.73_m2} — AB (ref >=60)
GLOBULIN: 3.6 g/dL (ref 1.9–3.7)
Glucose, Bld: 196 mg/dL — ABNORMAL HIGH (ref 65–99)
Potassium: 4 mmol/L (ref 3.5–5.3)
SODIUM: 141 mmol/L (ref 135–146)
TOTAL PROTEIN: 7.7 g/dL (ref 6.1–8.1)
Total Bilirubin: 0.4 mg/dL (ref 0.2–1.2)

## 2017-12-06 LAB — CBC WITH DIFFERENTIAL/PLATELET
BASOS PCT: 0.6 %
Basophils Absolute: 38 cells/uL (ref 0–200)
Eosinophils Absolute: 237 cells/uL (ref 15–500)
Eosinophils Relative: 3.7 %
HEMATOCRIT: 37.9 % — AB (ref 38.5–50.0)
Hemoglobin: 12.5 g/dL — ABNORMAL LOW (ref 13.2–17.1)
LYMPHS ABS: 2534 {cells}/uL (ref 850–3900)
MCH: 29.7 pg (ref 27.0–33.0)
MCHC: 33 g/dL (ref 32.0–36.0)
MCV: 90 fL (ref 80.0–100.0)
MPV: 9.7 fL (ref 7.5–12.5)
Monocytes Relative: 6.7 %
NEUTROS ABS: 3162 {cells}/uL (ref 1500–7800)
Neutrophils Relative %: 49.4 %
Platelets: 397 10*3/uL (ref 140–400)
RBC: 4.21 10*6/uL (ref 4.20–5.80)
RDW: 13 % (ref 11.0–15.0)
Total Lymphocyte: 39.6 %
WBC: 6.4 10*3/uL (ref 3.8–10.8)
WBCMIX: 429 {cells}/uL (ref 200–950)

## 2017-12-06 LAB — RPR: RPR Ser Ql: NONREACTIVE

## 2017-12-06 LAB — HIV-1 RNA QUANT-NO REFLEX-BLD
HIV 1 RNA Quant: <20 copies/mL — AB
HIV-1 RNA Quant, Log: <1.3 Log copies/mL — AB

## 2018-01-16 ENCOUNTER — Ambulatory Visit: Payer: Self-pay

## 2018-02-20 ENCOUNTER — Other Ambulatory Visit: Payer: Self-pay

## 2018-02-20 DIAGNOSIS — K610 Anal abscess: Secondary | ICD-10-CM

## 2018-02-21 LAB — T-HELPER CELL (CD4) - (RCID CLINIC ONLY)
CD4 T CELL HELPER: 28 % — AB (ref 33–55)
CD4 T Cell Abs: 630 /uL (ref 400–2700)

## 2018-02-23 LAB — CBC WITH DIFFERENTIAL/PLATELET
BASOS PCT: 0.9 %
Basophils Absolute: 50 cells/uL (ref 0–200)
EOS PCT: 4.2 %
Eosinophils Absolute: 231 cells/uL (ref 15–500)
HEMATOCRIT: 34.7 % — AB (ref 38.5–50.0)
Hemoglobin: 12.1 g/dL — ABNORMAL LOW (ref 13.2–17.1)
LYMPHS ABS: 2112 {cells}/uL (ref 850–3900)
MCH: 31.1 pg (ref 27.0–33.0)
MCHC: 34.9 g/dL (ref 32.0–36.0)
MCV: 89.2 fL (ref 80.0–100.0)
MPV: 9.7 fL (ref 7.5–12.5)
Monocytes Relative: 8 %
NEUTROS ABS: 2668 {cells}/uL (ref 1500–7800)
Neutrophils Relative %: 48.5 %
Platelets: 377 10*3/uL (ref 140–400)
RBC: 3.89 10*6/uL — AB (ref 4.20–5.80)
RDW: 12.8 % (ref 11.0–15.0)
Total Lymphocyte: 38.4 %
WBC: 5.5 10*3/uL (ref 3.8–10.8)
WBCMIX: 440 {cells}/uL (ref 200–950)

## 2018-02-23 LAB — HIV-1 RNA QUANT-NO REFLEX-BLD
HIV 1 RNA QUANT: NOT DETECTED {copies}/mL
HIV-1 RNA QUANT, LOG: NOT DETECTED {Log_copies}/mL

## 2018-02-23 LAB — BASIC METABOLIC PANEL
BUN / CREAT RATIO: 12 (calc) (ref 6–22)
BUN: 18 mg/dL (ref 7–25)
CHLORIDE: 106 mmol/L (ref 98–110)
CO2: 29 mmol/L (ref 20–32)
CREATININE: 1.53 mg/dL — AB (ref 0.70–1.25)
Calcium: 9.6 mg/dL (ref 8.6–10.3)
Glucose, Bld: 134 mg/dL — ABNORMAL HIGH (ref 65–99)
POTASSIUM: 4.3 mmol/L (ref 3.5–5.3)
Sodium: 141 mmol/L (ref 135–146)

## 2018-03-19 ENCOUNTER — Encounter: Payer: Self-pay | Admitting: Internal Medicine

## 2018-05-10 ENCOUNTER — Telehealth: Payer: Self-pay

## 2018-05-10 MED ORDER — OSELTAMIVIR PHOSPHATE 75 MG PO CAPS: 75.0000 mg | ORAL_CAPSULE | Freq: Two times a day (BID) | ORAL | 0 refills | Status: AC

## 2018-05-10 NOTE — Telephone Encounter (Signed)
Patient called stating he has the Flu. Dexcribes symptoms as fevers, chills, cough. Patient states symptoms started  On Tuesday.  Per Marcos Eke NP patient to start Tamiflu 75mg  Po 2 times daily for 5 days.

## 2018-05-31 ENCOUNTER — Ambulatory Visit: Payer: Self-pay

## 2018-07-30 IMAGING — CT CT ABD-PELV W/ CM
2 of 5 series · 15 of 46 positions shown, 17 images · IV contrast (ISOVUE)
Comparison: None.

CLINICAL DATA: 61-year-old hypertensive male with rectal abscess.
HIV. Initial encounter.

EXAM:
CT ABDOMEN AND PELVIS WITH CONTRAST
TECHNIQUE: Multidetector CT imaging of the abdomen and pelvis was performed
using the standard protocol following bolus administration of
intravenous contrast.
CONTRAST:  100mL 31VJWZ-4TT IOPAMIDOL (31VJWZ-4TT) INJECTION 61%

[Series 2: abd/pel with · axial · 0.91mm/px · z∈[-482,-57]mm · 12 of 99 slices shown, 14 images]
[im 7/99  soft-tissue]
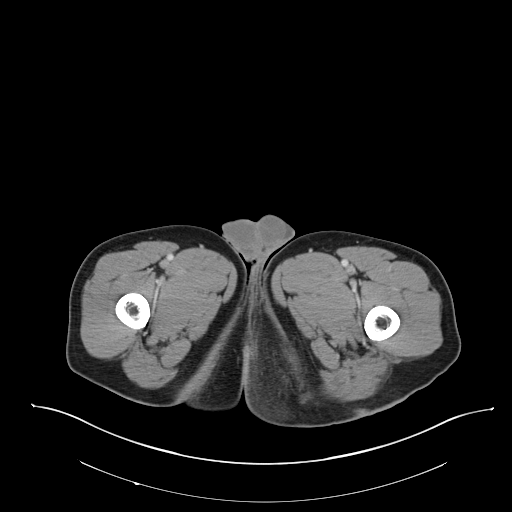
[im 7/99  bone]
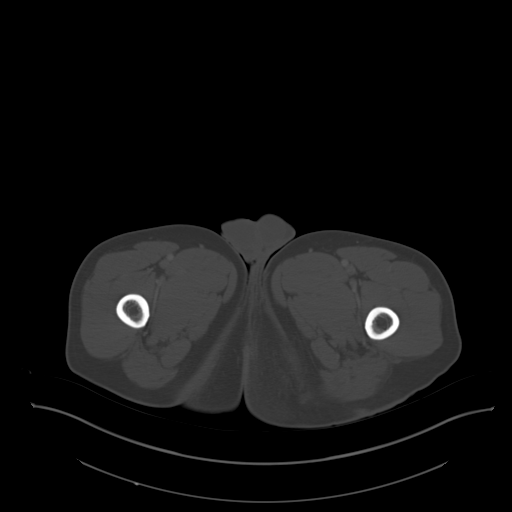
[im 13/99  soft-tissue]
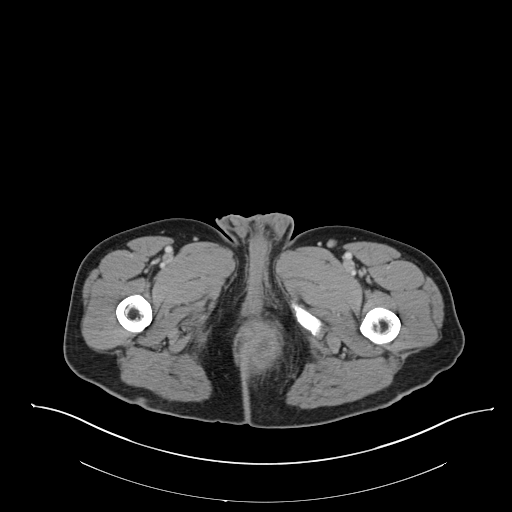
[im 25/99  soft-tissue]
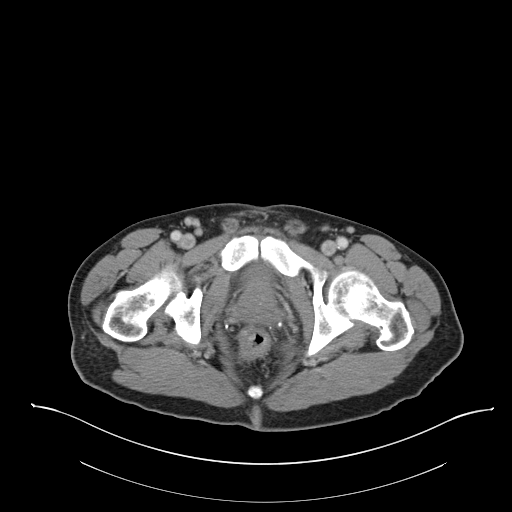
[im 31/99  soft-tissue]
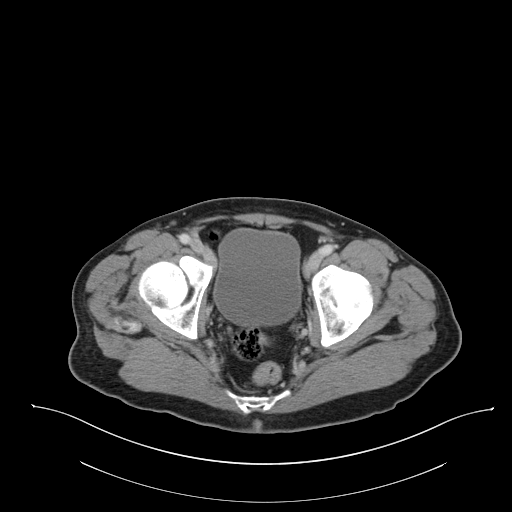
[im 37/99  soft-tissue]
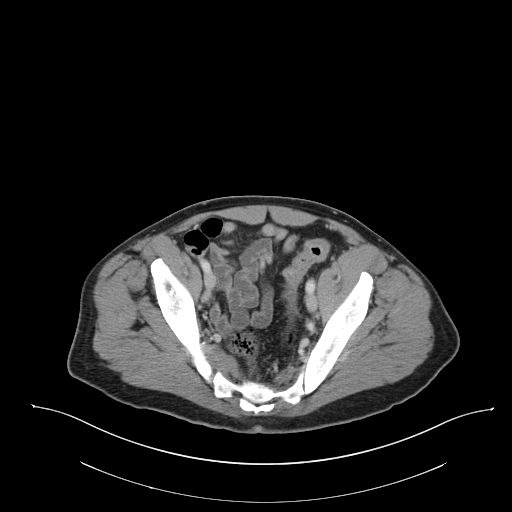
[im 43/99  soft-tissue]
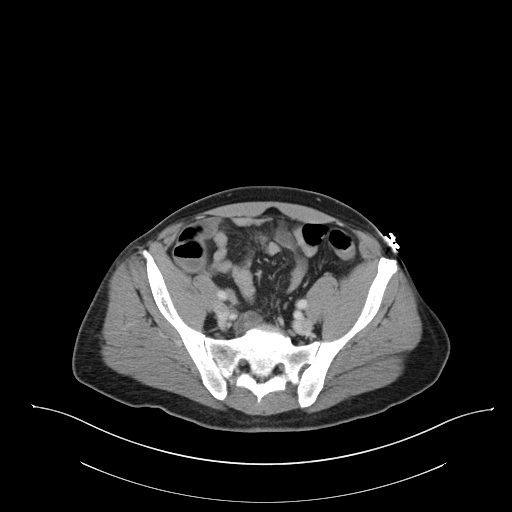
[im 56/99  soft-tissue]
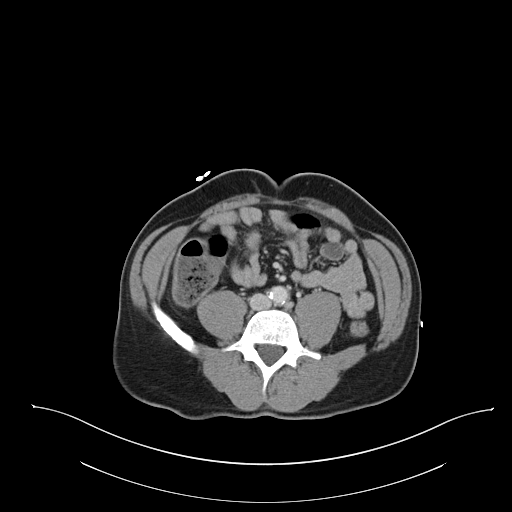
[im 62/99  soft-tissue]
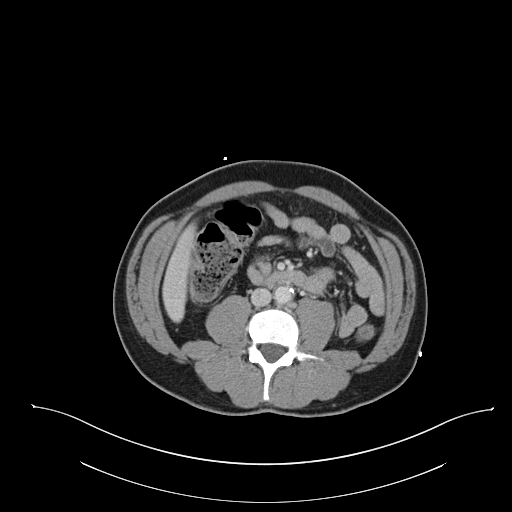
[im 68/99  soft-tissue]
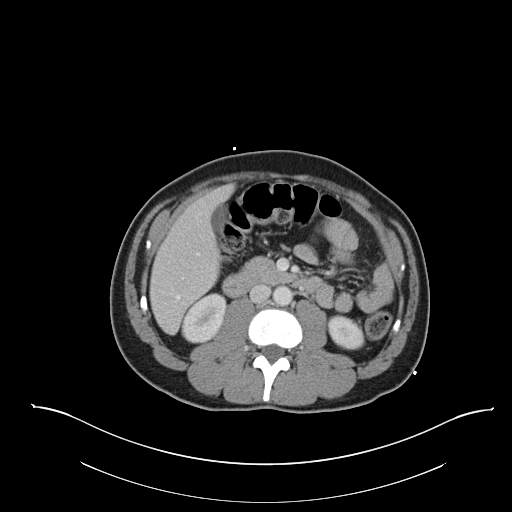
[im 68/99  bone]
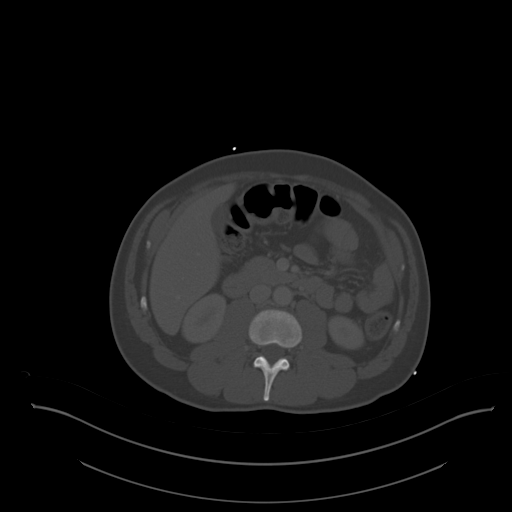
[im 74/99  soft-tissue]
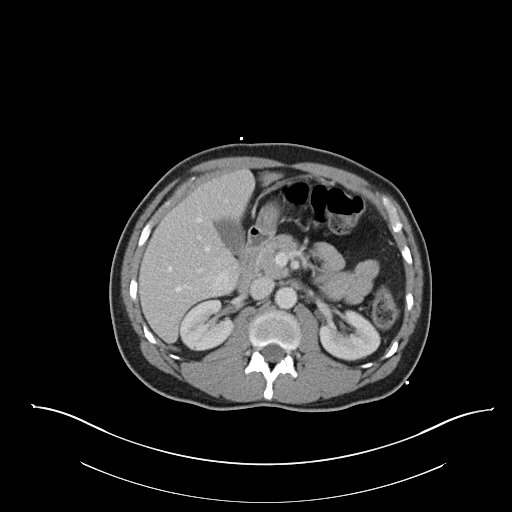
[im 86/99  soft-tissue]
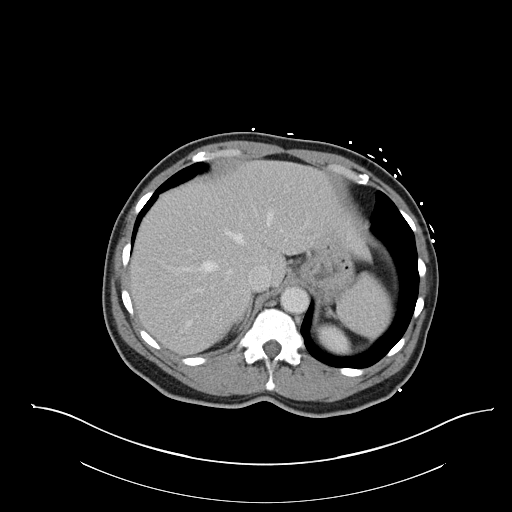
[im 92/99  soft-tissue]
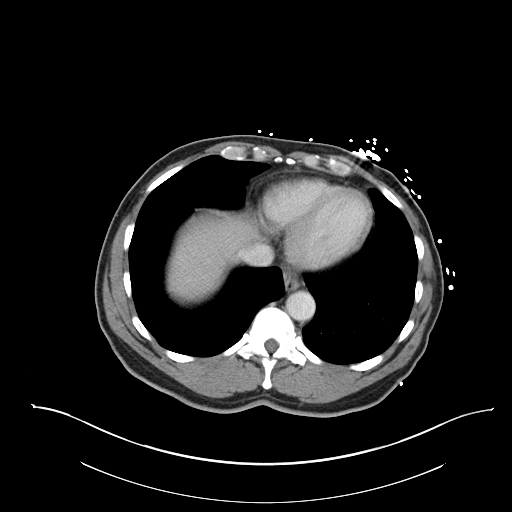

[Series 6: coronal a/|p · coronal · 0.75mm/px · 3 of 145 slices shown]
[im 49/145  soft-tissue]
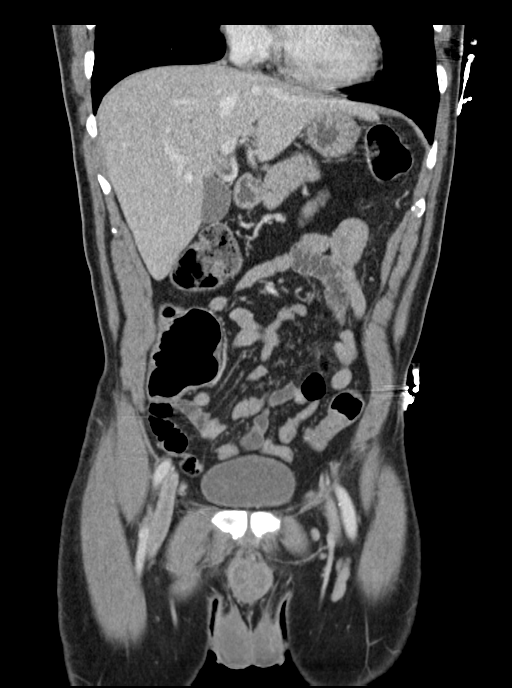
[im 65/145  soft-tissue]
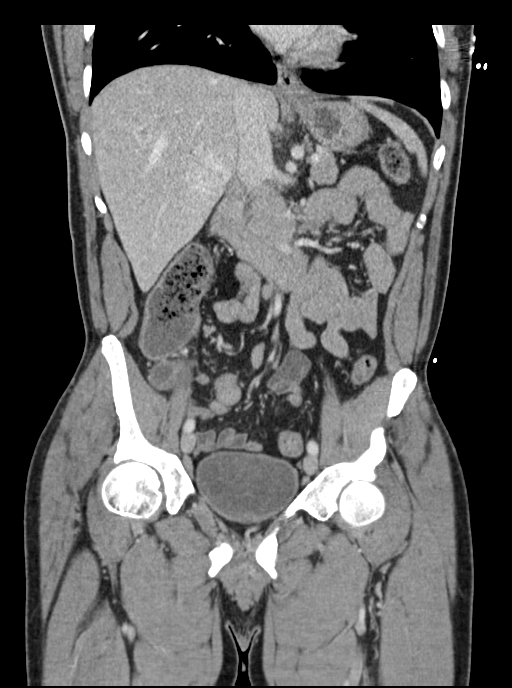
[im 81/145  soft-tissue]
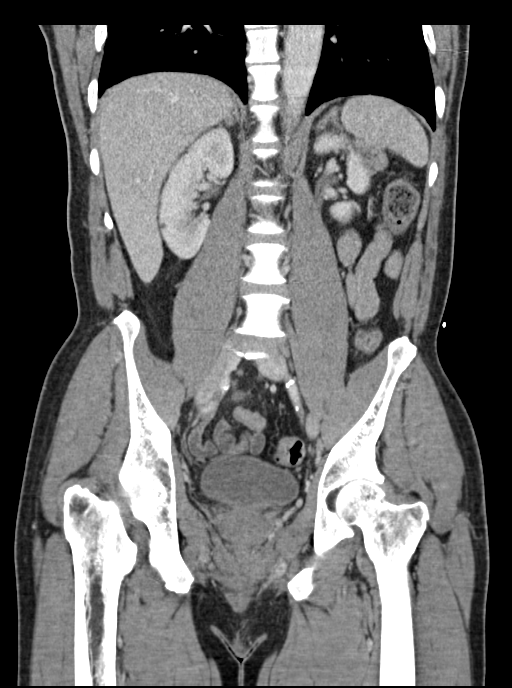

[15 of 46 positions shown; findings below may reference images not displayed]

FINDINGS: Lower chest: Scarring/atelectasis lung bases.  Normal heart size.

Hepatobiliary: 4.8 cm enhancing lesion posterior medial right lobe
of liver (segment 6). 1.7 cm enhancing lesion mid aspect segment 6.

No calcified gallstone.

Pancreas: No pancreatic mass or inflammation.

Spleen: No splenic mass or enlargement.

Adrenals/Urinary Tract: Subcentimeter low-density structure right
adrenal gland probably a small adenoma. No left adrenal lesion.

No renal obstructing stone or hydronephrosis. Bilateral renal
low-density structures, larger ones which are cysts, others too
small to characterize although statistically likely cysts.

Stomach/Bowel: 3.5 x 2 x 4.7 cm abscess located between the left
rectum/anus and left external sphincter. Prominent thickening of the
left external sphincter with haziness of fat planes within the left
ischialanal/ ischialrectal fossa consistent with spread of
inflammatory process. There is also hazy infiltration of the
subcutaneous fat of the left buttock consistent with cellulitis.
With the spread of infection, possibility of fistula is raised
although not well delineated. Abscess causes narrowing of the distal
rectum. Perianal abscess extends towards the base of the penis.

Portions of bowel/ stomach under distended and evaluation limited.
Partially fluid-filled cecum. No other evidence of extraluminal
bowel inflammatory process. No free intraperitoneal air.

Vascular/Lymphatic: Atherosclerotic changes aorta and aortic branch
vessels. No aortic aneurysm or large vessel occlusion.

Shotty inguinal lymph nodes with fatty centers within normal limits.
Normal size retroperitoneal lymph nodes.

Reproductive: Perianal abscess extends towards the base of the
penis. Tiny prostate gland calcifications.

Other: No free intraperitoneal air.

Musculoskeletal: No worrisome osseous lesion.
IMPRESSION: Left perianal abscess with adjacent cellulitis as detailed above.

4.8 cm enhancing lesion posterior medial right lobe of liver
(segment 6). 1.7 cm enhancing lesion mid aspect segment 6. It is
possible these areas represent hemangiomas although incompletely
assessed on present exam. Findings can be evaluated with dedicated
contrast enhanced liver MR after present acute episode has cleared.

Aortic Atherosclerosis ([id number].R).

## 2018-08-30 ENCOUNTER — Ambulatory Visit: Payer: Self-pay

## 2018-08-30 ENCOUNTER — Other Ambulatory Visit: Payer: Self-pay

## 2018-08-30 ENCOUNTER — Other Ambulatory Visit: Payer: Self-pay | Admitting: *Deleted

## 2018-08-30 DIAGNOSIS — B20 Human immunodeficiency virus [HIV] disease: Secondary | ICD-10-CM

## 2018-08-31 LAB — T-HELPER CELL (CD4) - (RCID CLINIC ONLY)
CD4 % Helper T Cell: 27 % — ABNORMAL LOW (ref 33–65)
CD4 T Cell Abs: 696 /uL (ref 400–1790)

## 2018-09-05 LAB — COMPLETE METABOLIC PANEL WITH GFR
AG Ratio: 1.1 (calc) (ref 1.0–2.5)
ALT: 15 U/L (ref 9–46)
AST: 15 U/L (ref 10–35)
Albumin: 4 g/dL (ref 3.6–5.1)
Alkaline phosphatase (APISO): 94 U/L (ref 35–144)
BUN: 12 mg/dL (ref 7–25)
CO2: 29 mmol/L (ref 20–32)
Calcium: 9.8 mg/dL (ref 8.6–10.3)
Chloride: 107 mmol/L (ref 98–110)
Creat: 1.2 mg/dL (ref 0.70–1.25)
GFR, Est African American: 75 mL/min/{1.73_m2} (ref >=60)
GFR, Est Non African American: 65 mL/min/{1.73_m2} (ref >=60)
Globulin: 3.6 g/dL (calc) (ref 1.9–3.7)
Glucose, Bld: 102 mg/dL — ABNORMAL HIGH (ref 65–99)
Potassium: 4.3 mmol/L (ref 3.5–5.3)
Sodium: 141 mmol/L (ref 135–146)
Total Bilirubin: 0.3 mg/dL (ref 0.2–1.2)
Total Protein: 7.6 g/dL (ref 6.1–8.1)

## 2018-09-05 LAB — CBC WITH DIFFERENTIAL/PLATELET
Absolute Monocytes: 501 cells/uL (ref 200–950)
Basophils Absolute: 61 cells/uL (ref 0–200)
Basophils Relative: 1.1 %
Eosinophils Absolute: 110 cells/uL (ref 15–500)
Eosinophils Relative: 2 %
HCT: 39 % (ref 38.5–50.0)
Hemoglobin: 13.3 g/dL (ref 13.2–17.1)
Lymphs Abs: 2580 cells/uL (ref 850–3900)
MCH: 30.4 pg (ref 27.0–33.0)
MCHC: 34.1 g/dL (ref 32.0–36.0)
MCV: 89 fL (ref 80.0–100.0)
MPV: 9.7 fL (ref 7.5–12.5)
Monocytes Relative: 9.1 %
Neutro Abs: 2250 cells/uL (ref 1500–7800)
Neutrophils Relative %: 40.9 %
Platelets: 354 10*3/uL (ref 140–400)
RBC: 4.38 10*6/uL (ref 4.20–5.80)
RDW: 12.2 % (ref 11.0–15.0)
Total Lymphocyte: 46.9 %
WBC: 5.5 10*3/uL (ref 3.8–10.8)

## 2018-09-05 LAB — HIV-1 RNA QUANT-NO REFLEX-BLD
HIV 1 RNA Quant: <20 copies/mL
HIV-1 RNA Quant, Log: <1.3 Log copies/mL

## 2018-09-17 ENCOUNTER — Other Ambulatory Visit: Payer: Self-pay

## 2018-09-17 ENCOUNTER — Encounter: Payer: Self-pay | Admitting: Internal Medicine

## 2018-09-17 ENCOUNTER — Ambulatory Visit (INDEPENDENT_AMBULATORY_CARE_PROVIDER_SITE_OTHER): Payer: Self-pay | Admitting: Internal Medicine

## 2018-09-17 ENCOUNTER — Other Ambulatory Visit: Payer: Self-pay | Admitting: Pharmacist

## 2018-09-17 VITALS — BP 157/89 | HR 86 | Temp 98.2°F | Wt 175.0 lb

## 2018-09-17 DIAGNOSIS — B20 Human immunodeficiency virus [HIV] disease: Secondary | ICD-10-CM

## 2018-09-17 MED ORDER — DARUN-COBIC-EMTRICIT-TENOFAF 800-150-200-10 MG PO TABS: 1.0000 | ORAL_TABLET | Freq: Every day | ORAL | 11 refills | Status: DC

## 2018-09-17 NOTE — Progress Notes (Signed)
RFV: follow up for hiv disease  Patient ID: [name], male   DOB: 07-May-1955, 63 y.o.   MRN: [id number]  HPI Has been on monotherapy with tivicay alone instead of combined tivicay-descovy.(previously on genvoya) He reports monotherapy x 3 months. He is stating that he knows better, just didn't know what to do with covid in community and capacity to come to clinic  Outpatient Encounter Medications as of 09/17/2018  Medication Sig  . amLODipine (NORVASC) 10 MG tablet TAKE 1 TABLET(10 MG) BY MOUTH DAILY  . CRESTOR 10 MG tablet Take 10 mg by mouth at bedtime.   . DESCOVY 200-25 MG tablet TAKE 1 TABLET BY MOUTH DAILY  . famotidine (PEPCID) 20 MG tablet TAKE 1 TABLET BY MOUTH DAILY AS NEEDED FOR HEARTBURN OR INDIGESTION.  Marland Kitchen gabapentin (NEURONTIN) 300 MG capsule Take 1 capsule (300 mg total) by mouth 2 (two) times daily.  . hydrochlorothiazide (HYDRODIURIL) 25 MG tablet Take 1 tablet (25 mg total) by mouth daily.  . imiquimod (ALDARA) 5 % cream Apply topically 3 (three) times a week. Apply at bedtime on affected area and wash off in the morning  . TIVICAY 50 MG tablet TAKE 1 TABLET(50 MG) BY MOUTH DAILY  . traMADol (ULTRAM) 50 MG tablet Take 1 tablet (50 mg total) by mouth every 6 (six) hours as needed for moderate pain.   No facility-administered encounter medications on file as of 09/17/2018.      Patient Active Problem List   Diagnosis Date Noted  . Perirectal abscess 01/26/2017  . Diarrhea 01/12/2017  . Genital herpes 03/12/2014  . Penile wart 03/12/2014  . Urethritis, nongonococcal 03/06/2013  . HYPERLIPIDEMIA 05/02/2007  . WEIGHT LOSS 08/30/2006  . HIV (human immunodeficiency virus infection) (HCC) 05/30/2006  . Essential hypertension 05/30/2006  . DENTAL CARIES 05/30/2006  . METHICILLIN RESISTANT STAPHYLOCOCCUS AUREUS INFECTION 10/31/2004     Health Maintenance Due  Topic Date Due  . COLONOSCOPY  09/11/2006     Review of Systems Review of Systems  Constitutional:  Negative for fever, chills, diaphoresis, activity change, appetite change, fatigue and unexpected weight change.  HENT: Negative for congestion, sore throat, rhinorrhea, sneezing, trouble swallowing and sinus pressure.  Eyes: Negative for photophobia and visual disturbance.  Respiratory: Negative for cough, chest tightness, shortness of breath, wheezing and stridor.  Cardiovascular: Negative for chest pain, palpitations and leg swelling.  Gastrointestinal: Negative for nausea, vomiting, abdominal pain, diarrhea, constipation, blood in stool, abdominal distention and anal bleeding.  Genitourinary: Negative for dysuria, hematuria, flank pain and difficulty urinating.  Musculoskeletal: Negative for myalgias, back pain, joint swelling, arthralgias and gait problem.  Skin: Negative for color change, pallor, rash and wound.  Neurological: Negative for dizziness, tremors, weakness and light-headedness.  Hematological: Negative for adenopathy. Does not bruise/bleed easily.  Psychiatric/Behavioral: Negative for behavioral problems, confusion, sleep disturbance, dysphoric mood, decreased concentration and agitation.    Physical Exam   BP (!) 157/89   Pulse 86   Temp 98.2 F (36.8 C) (Oral)   Wt 175 lb (79.4 kg)   BMI 23.73 kg/m   Physical Exam  Constitutional: He is oriented to person, place, and time. He appears well-developed and well-nourished. No distress.  HENT:  Mouth/Throat: Oropharynx is clear and moist. No oropharyngeal exudate.  Cardiovascular: Normal rate, regular rhythm and normal heart sounds. Exam reveals no gallop and no friction rub.  No murmur heard.  Pulmonary/Chest: Effort normal and breath sounds normal. No respiratory distress. He has no wheezes.  Abdominal: Soft. Bowel  sounds are normal. He exhibits no distension. There is no tenderness.  Lymphadenopathy:  He has no cervical adenopathy.  Neurological: He is alert and oriented to person, place, and time.  Skin: Skin is  warm and dry. No rash noted. No erythema.  Psychiatric: He has a normal mood and affect. His behavior is normal.    Lab Results  Component Value Date   CD4TCELL 27 (L) 08/30/2018   Lab Results  Component Value Date   CD4TABS 696 08/30/2018   CD4TABS 630 02/20/2018   CD4TABS 710 12/04/2017   Lab Results  Component Value Date   HIV1RNAQUANT <20 NOT DETECTED 08/30/2018   Lab Results  Component Value Date   HEPBSAB YES 06/12/2006   Lab Results  Component Value Date   LABRPR NON-REACTIVE 12/04/2017    CBC Lab Results  Component Value Date   WBC 5.5 08/30/2018   RBC 4.38 08/30/2018   HGB 13.3 08/30/2018   HCT 39.0 08/30/2018   PLT 354 08/30/2018   MCV 89.0 08/30/2018   MCH 30.4 08/30/2018   MCHC 34.1 08/30/2018   RDW 12.2 08/30/2018   LYMPHSABS 2,580 08/30/2018   MONOABS 0.9 01/26/2017   EOSABS 110 08/30/2018    BMET Lab Results  Component Value Date   NA 141 08/30/2018   K 4.3 08/30/2018   CL 107 08/30/2018   CO2 29 08/30/2018   GLUCOSE 102 (H) 08/30/2018   BUN 12 08/30/2018   CREATININE 1.20 08/30/2018   CALCIUM 9.8 08/30/2018   GFRNONAA 65 08/30/2018   GFRAA 75 08/30/2018      Assessment and Plan  htn = poorly controlled. Reminded him to take medicaitons.  hiv disease= his VL is undetectable despite being on monotherapy in may. We will check genotype. adherence counseling change to pi based regimen in the meantime to get genotype back ot see if R to integrase inhibitor class. Will switch to symtuza  Back with cassie in 3 wk  Back to see me in 3 mo.

## 2018-09-17 NOTE — Progress Notes (Signed)
HPI: [name] is a 63 y.o. male who presents to the [facility] clinic for HIV follow-up.  Patient Active Problem List   Diagnosis Date Noted  . Perirectal abscess 01/26/2017  . Diarrhea 01/12/2017  . Genital herpes 03/12/2014  . Penile wart 03/12/2014  . Urethritis, nongonococcal 03/06/2013  . HYPERLIPIDEMIA 05/02/2007  . WEIGHT LOSS 08/30/2006  . HIV (human immunodeficiency virus infection) (HCC) 05/30/2006  . Essential hypertension 05/30/2006  . DENTAL CARIES 05/30/2006  . METHICILLIN RESISTANT STAPHYLOCOCCUS AUREUS INFECTION 10/31/2004    Patient's Medications  New Prescriptions   DARUNAVIR-COBICISCTAT-EMTRICITABINE-TENOFOVIR ALAFENAMIDE (SYMTUZA) 800-150-200-10 MG TABS    Take 1 tablet by mouth daily with breakfast.  Previous Medications   AMLODIPINE (NORVASC) 10 MG TABLET    TAKE 1 TABLET(10 MG) BY MOUTH DAILY   CRESTOR 10 MG TABLET    Take 10 mg by mouth at bedtime.    DESCOVY 200-25 MG TABLET    TAKE 1 TABLET BY MOUTH DAILY   FAMOTIDINE (PEPCID) 20 MG TABLET    TAKE 1 TABLET BY MOUTH DAILY AS NEEDED FOR HEARTBURN OR INDIGESTION.   GABAPENTIN (NEURONTIN) 300 MG CAPSULE    Take 1 capsule (300 mg total) by mouth 2 (two) times daily.   HYDROCHLOROTHIAZIDE (HYDRODIURIL) 25 MG TABLET    Take 1 tablet (25 mg total) by mouth daily.   IMIQUIMOD (ALDARA) 5 % CREAM    Apply topically 3 (three) times a week. Apply at bedtime on affected area and wash off in the morning   TIVICAY 50 MG TABLET    TAKE 1 TABLET(50 MG) BY MOUTH DAILY   TRAMADOL (ULTRAM) 50 MG TABLET    Take 1 tablet (50 mg total) by mouth every 6 (six) hours as needed for moderate pain.  Modified Medications   No medications on file  Discontinued Medications   No medications on file    Allergies: Allergies  Allergen Reactions  . Zosyn [Piperacillin Sod-Tazobactam So] Other (See Comments)    Lips swelling    Past Medical History: Past Medical History:  Diagnosis Date  . HIV infection (HCC)   . Hypertension      Social History: Social History   Socioeconomic History  . Marital status: Single    Spouse name: Not on file  . Number of children: Not on file  . Years of education: Not on file  . Highest education level: Not on file  Occupational History  . Not on file  Social Needs  . Financial resource strain: Not on file  . Food insecurity:    Worry: Not on file    Inability: Not on file  . Transportation needs:    Medical: Not on file    Non-medical: Not on file  Tobacco Use  . Smoking status: Former Smoker    Packs/day: 0.25    Years: 6.00    Pack years: 1.50    Types: Cigarettes  . Smokeless tobacco: Never Used  . Tobacco comment: quit 20 yrs ago  Substance and Sexual Activity  . Alcohol use: Yes    Alcohol/week: 0.0 standard drinks    Comment: occ  . Drug use: Yes    Frequency: 3.0 times per week    Types: Marijuana    Comment: occ marijuana  . Sexual activity: Yes    Partners: Female, Male    Birth control/protection: Condom    Comment: pt. given condoms  Lifestyle  . Physical activity:    Days per week: Not on file  Minutes per session: Not on file  . Stress: Not on file  Relationships  . Social connections:    Talks on phone: Not on file    Gets together: Not on file    Attends religious service: Not on file    Active member of club or organization: Not on file    Attends meetings of clubs or organizations: Not on file    Relationship status: Not on file  Other Topics Concern  . Not on file  Social History Narrative  . Not on file    Labs: Lab Results  Component Value Date   HIV1RNAQUANT <20 NOT DETECTED 08/30/2018   HIV1RNAQUANT <20 NOT DETECTED 02/20/2018   HIV1RNAQUANT <20 DETECTED (A) 12/04/2017   CD4TABS 696 08/30/2018   CD4TABS 630 02/20/2018   CD4TABS 710 12/04/2017    RPR and STI Lab Results  Component Value Date   LABRPR NON-REACTIVE 12/04/2017   LABRPR NON-REACTIVE 05/29/2017   LABRPR NON REAC 07/19/2016   LABRPR NON REAC  07/02/2015   LABRPR NON REAC 07/10/2014    STI Results GC CT  07/19/2016 Negative Negative  12/16/2014 **POSITIVE**(A) **POSITIVE**(A)  03/12/2014 NG: Negative **CT: POSITIVE**(A)    Hepatitis B Lab Results  Component Value Date   HEPBSAB YES 06/12/2006   HEPBSAG NO 06/12/2006   Hepatitis C No results found for: HEPCAB, HCVRNAPCRQN Hepatitis A No results found for: HAV Lipids: Lab Results  Component Value Date   CHOL 138 07/19/2016   TRIG 154 (H) 07/19/2016   HDL 22 (L) 07/19/2016   CHOLHDL 6.3 (H) 07/19/2016   VLDL 31 (H) 07/19/2016   LDLCALC 85 07/19/2016    Current HIV Regimen: Tivicay and Descovy  Assessment: Thorn is here today to follow-up with Dr. Drue Second for his HIV infection.  He is supposed to be taking Tivicay and Descovy together but states that he has just been taking Tivicay for "quite some time". He states that his ADAP ran out and he was trying to get here to renew it but could not. He thinks he has only taken Tivicay for the last 3 months, since the COVID 19 pandemic ramped up. Due to the pandemic, ADAP extended it's deadline for renewal from 3/31 to 5/31, so he should have been able to get his medications.  I spent some significant time with him today explaining the importance of staying on top of his ART and not taking one without the other.  I discussed switching him to a one pill once daily regimen so that this doesn't happen again in the future.  Will switch him to [employer].  Explained that Darrell Jewel is a one pill once daily medication with food and the importance of not missing any doses. Explained resistance and how it develops and why it is so important to take Symtuza daily and not skip days or doses. Counseled patient to take it around the same time each day with a meal and the importance of having food on your stomach in order for the medication to be absorbed properly. Counseled on what to do if dose is missed, if closer to missed dose take immediately, if  closer to next dose then skip and resume normal schedule.   Cautioned on possible side effects the first week or so including nausea, diarrhea, dizziness, fatigue, rash, and headaches but that they should resolve after the first couple of weeks. I reviewed patient medications and found no drug interactions. Discussed with patient to call clinic if he starts a new medication  or herbal supplement. I gave the patient my card and told him to call me with any issues/questions/concerns.  Luckily, his HIV viral load was somehow undetectable a few weeks ago when he had labs drawn. Dr. Drue SecondSnider will get resistance labs today just in case and I will see him back in 3 weeks to discuss/recheck labs on his new regimen of Symtuza.  His ADAP has actually expired now, and he will renew today with Olegario MessierKathy.  I gave him [facility] so that he could pick [facility] up today.  Plan: - Stop Tivicay (and Descovy) - Start Symtuza PO once daily with food - F/u with me 6/24 at 10am   L. , PharmD, BCIDP, AAHIVP, CPP Infectious Diseases Clinical Pharmacist [facility] 09/17/2018, 2:29 PM

## 2018-09-25 LAB — HIV RNA, RTPCR W/R GT (RTI, PI,INT)
HIV 1 RNA Quant: 67 copies/mL — ABNORMAL HIGH
HIV-1 RNA Quant, Log: 1.83 Log copies/mL — ABNORMAL HIGH

## 2018-10-10 ENCOUNTER — Ambulatory Visit (INDEPENDENT_AMBULATORY_CARE_PROVIDER_SITE_OTHER): Payer: Self-pay | Admitting: Pharmacist

## 2018-10-10 ENCOUNTER — Other Ambulatory Visit: Payer: Self-pay

## 2018-10-10 DIAGNOSIS — K611 Rectal abscess: Secondary | ICD-10-CM

## 2018-10-10 DIAGNOSIS — B20 Human immunodeficiency virus [HIV] disease: Secondary | ICD-10-CM

## 2018-10-10 DIAGNOSIS — Z79899 Other long term (current) drug therapy: Secondary | ICD-10-CM

## 2018-10-10 DIAGNOSIS — I10 Essential (primary) hypertension: Secondary | ICD-10-CM

## 2018-10-10 MED ORDER — TRAMADOL HCL 50 MG PO TABS: 50.0000 mg | ORAL_TABLET | Freq: Four times a day (QID) | ORAL | 2 refills | Status: DC | PRN

## 2018-10-10 MED ORDER — HYDROCHLOROTHIAZIDE 25 MG PO TABS: 25.0000 mg | ORAL_TABLET | Freq: Every day | ORAL | 11 refills | Status: DC

## 2018-10-10 MED ORDER — GABAPENTIN 300 MG PO CAPS: 300.0000 mg | ORAL_CAPSULE | Freq: Two times a day (BID) | ORAL | 5 refills | Status: DC

## 2018-10-10 MED ORDER — AMLODIPINE BESYLATE 10 MG PO TABS: 10.0000 mg | ORAL_TABLET | Freq: Every day | ORAL | 11 refills | Status: DC

## 2018-10-10 NOTE — Patient Instructions (Signed)
[facility] - [place]  [address] [place], [place] [address]  [phone]

## 2018-10-10 NOTE — Progress Notes (Signed)
HPI: [name] is a 63 y.o. male who presents to the [facility] clinic for HIV follow-up.  Patient Active Problem List   Diagnosis Date Noted  . Perirectal abscess 01/26/2017  . Diarrhea 01/12/2017  . Genital herpes 03/12/2014  . Penile wart 03/12/2014  . Urethritis, nongonococcal 03/06/2013  . HYPERLIPIDEMIA 05/02/2007  . WEIGHT LOSS 08/30/2006  . HIV (human immunodeficiency virus infection) (HCC) 05/30/2006  . Essential hypertension 05/30/2006  . DENTAL CARIES 05/30/2006  . METHICILLIN RESISTANT STAPHYLOCOCCUS AUREUS INFECTION 10/31/2004    Patient's Medications  New Prescriptions   No medications on file  Previous Medications   DARUNAVIR-COBICISCTAT-EMTRICITABINE-TENOFOVIR ALAFENAMIDE (SYMTUZA) 800-150-200-10 MG TABS    Take 1 tablet by mouth daily with breakfast.   DESCOVY 200-25 MG TABLET    TAKE 1 TABLET BY MOUTH DAILY   IMIQUIMOD (ALDARA) 5 % CREAM    Apply topically 3 (three) times a week. Apply at bedtime on affected area and wash off in the morning  Modified Medications   Modified Medication Previous Medication   AMLODIPINE (NORVASC) 10 MG TABLET amLODipine (NORVASC) 10 MG tablet      Take 1 tablet (10 mg total) by mouth daily.    TAKE 1 TABLET(10 MG) BY MOUTH DAILY   GABAPENTIN (NEURONTIN) 300 MG CAPSULE gabapentin (NEURONTIN) 300 MG capsule      Take 1 capsule (300 mg total) by mouth 2 (two) times daily.    Take 1 capsule (300 mg total) by mouth 2 (two) times daily.   HYDROCHLOROTHIAZIDE (HYDRODIURIL) 25 MG TABLET hydrochlorothiazide (HYDRODIURIL) 25 MG tablet      Take 1 tablet (25 mg total) by mouth daily.    Take 1 tablet (25 mg total) by mouth daily.   TRAMADOL (ULTRAM) 50 MG TABLET traMADol (ULTRAM) 50 MG tablet      Take 1 tablet (50 mg total) by mouth every 6 (six) hours as needed for moderate pain.    Take 1 tablet (50 mg total) by mouth every 6 (six) hours as needed for moderate pain.  Discontinued Medications   CRESTOR 10 MG TABLET    Take 10 mg by  mouth at bedtime.    FAMOTIDINE (PEPCID) 20 MG TABLET    TAKE 1 TABLET BY MOUTH DAILY AS NEEDED FOR HEARTBURN OR INDIGESTION.   TIVICAY 50 MG TABLET    TAKE 1 TABLET(50 MG) BY MOUTH DAILY    Allergies: Allergies  Allergen Reactions  . Zosyn [Piperacillin Sod-Tazobactam So] Other (See Comments)    Lips swelling    Past Medical History: Past Medical History:  Diagnosis Date  . HIV infection (HCC)   . Hypertension     Social History: Social History   Socioeconomic History  . Marital status: Single    Spouse name: Not on file  . Number of children: Not on file  . Years of education: Not on file  . Highest education level: Not on file  Occupational History  . Not on file  Social Needs  . Financial resource strain: Not on file  . Food insecurity    Worry: Not on file    Inability: Not on file  . Transportation needs    Medical: Not on file    Non-medical: Not on file  Tobacco Use  . Smoking status: Former Smoker    Packs/day: 0.25    Years: 6.00    Pack years: 1.50    Types: Cigarettes  . Smokeless tobacco: Never Used  . Tobacco comment: quit 20 yrs  ago  Substance and Sexual Activity  . Alcohol use: Yes    Alcohol/week: 0.0 standard drinks    Comment: occ  . Drug use: Yes    Frequency: 3.0 times per week    Types: Marijuana    Comment: occ marijuana  . Sexual activity: Yes    Partners: Female, Male    Birth control/protection: Condom    Comment: pt. given condoms  Lifestyle  . Physical activity    Days per week: Not on file    Minutes per session: Not on file  . Stress: Not on file  Relationships  . Social Musicianconnections    Talks on phone: Not on file    Gets together: Not on file    Attends religious service: Not on file    Active member of club or organization: Not on file    Attends meetings of clubs or organizations: Not on file    Relationship status: Not on file  Other Topics Concern  . Not on file  Social History Narrative  . Not on file     Labs: Lab Results  Component Value Date   HIV1RNAQUANT 67 (H) 09/17/2018   HIV1RNAQUANT <20 NOT DETECTED 08/30/2018   HIV1RNAQUANT <20 NOT DETECTED 02/20/2018   CD4TABS 696 08/30/2018   CD4TABS 630 02/20/2018   CD4TABS 710 12/04/2017    RPR and STI Lab Results  Component Value Date   LABRPR NON-REACTIVE 12/04/2017   LABRPR NON-REACTIVE 05/29/2017   LABRPR NON REAC 07/19/2016   LABRPR NON REAC 07/02/2015   LABRPR NON REAC 07/10/2014    STI Results GC CT  07/19/2016 Negative Negative  12/16/2014 **POSITIVE**(A) **POSITIVE**(A)  03/12/2014 NG: Negative **CT: POSITIVE**(A)    Hepatitis B Lab Results  Component Value Date   HEPBSAB YES 06/12/2006   HEPBSAG NO 06/12/2006   Hepatitis C No results found for: HEPCAB, HCVRNAPCRQN Hepatitis A No results found for: HAV Lipids: Lab Results  Component Value Date   CHOL 138 07/19/2016   TRIG 154 (H) 07/19/2016   HDL 22 (L) 07/19/2016   CHOLHDL 6.3 (H) 07/19/2016   VLDL 31 (H) 07/19/2016   LDLCALC 85 07/19/2016    Current HIV Regimen: Symtuza  Assessment: [name] comes into the clinic today for HIV follow-up after being on an incomplete regimen for several months.  We recently discontinued his Tivicay and Descovy after he was taking Tivicay alone and switched him to [employer].  He tells me today that he was mistaken and only took Tivicay monotherapy for 3 weeks and not 3 months.  He apologized and said that he ran out of Descovy at the end of April. He is tolerating Symtuza very well with no side effects.  He took it late one day but otherwise has not missed any doses and takes it with food.  He likes the one pill vs having to remember to take 2 pills.   His HIV viral load was only 67 copies when checked at last visit so no genotype could be done. I explained this to him and he was pleased that it was not too high. He is asking for refills of his maintenance medications too as Dr. Drue SecondSnider usually prescribes them for him.    Will  continue him on Symtuza as it seems like he has had issues in the past with medication adherence and following up reliably.  He is tolerating it fine, so no need to switch.  I will refill his maintenance medications [facility]  He is going to follow-up with the colorectal surgeon to help with his perirectal abscess.  I will refill his tramadol in the meantime.  I also gave him the number for our dental clinic to get him back on the schedule as he has a loose tooth that is decaying.  I will check his HIV viral load again today and also check a lipid panel as it has been a few years since we last checked.  He used to take Crestor but states he is no longer taking that.  Will make him an appointment in July to renew his HMAP and one for follow-up with Dr. Baxter Flattery in 3 months.  He knows to call if he has any issues (I told him this repeatedly).   Plan: - Continue Symtuza PO once daily with food - HIV RNA and lipid panel today - Refill amlodipine, HCTZ, gabapentin, and tramadol - F/u to renew HMAP 7/21 at 9am - F/u with Dr. Baxter Flattery 9/23 at 845am   L. , PharmD, BCIDP, AAHIVP, [facility] 10/10/2018, 3:21 PM

## 2018-10-15 LAB — HIV-1 RNA QUANT-NO REFLEX-BLD
HIV 1 RNA Quant: 31 copies/mL — ABNORMAL HIGH
HIV-1 RNA Quant, Log: 1.49 Log copies/mL — ABNORMAL HIGH

## 2018-10-15 LAB — LIPID PANEL
Cholesterol: 261 mg/dL — ABNORMAL HIGH (ref <200)
HDL: 44 mg/dL (ref >=40)
LDL Cholesterol (Calc): 190 mg/dL (calc) — ABNORMAL HIGH
Non-HDL Cholesterol (Calc): 217 mg/dL (calc) — ABNORMAL HIGH (ref <130)
Total CHOL/HDL Ratio: 5.9 (calc) — ABNORMAL HIGH (ref <5.0)
Triglycerides: 132 mg/dL (ref <150)

## 2018-11-06 ENCOUNTER — Ambulatory Visit: Payer: Self-pay

## 2019-01-09 ENCOUNTER — Ambulatory Visit: Payer: Self-pay | Admitting: Internal Medicine

## 2019-01-10 ENCOUNTER — Ambulatory Visit (INDEPENDENT_AMBULATORY_CARE_PROVIDER_SITE_OTHER): Payer: Self-pay | Admitting: Internal Medicine

## 2019-01-10 ENCOUNTER — Ambulatory Visit: Payer: Self-pay

## 2019-01-10 ENCOUNTER — Other Ambulatory Visit: Payer: Self-pay

## 2019-01-10 ENCOUNTER — Encounter: Payer: Self-pay | Admitting: Internal Medicine

## 2019-01-10 VITALS — BP 159/93 | HR 93

## 2019-01-10 DIAGNOSIS — Z23 Encounter for immunization: Secondary | ICD-10-CM

## 2019-01-10 DIAGNOSIS — K604 Rectal fistula: Secondary | ICD-10-CM

## 2019-01-10 DIAGNOSIS — B20 Human immunodeficiency virus [HIV] disease: Secondary | ICD-10-CM

## 2019-01-10 DIAGNOSIS — Z716 Tobacco abuse counseling: Secondary | ICD-10-CM

## 2019-01-10 DIAGNOSIS — Z8619 Personal history of other infectious and parasitic diseases: Secondary | ICD-10-CM

## 2019-01-10 DIAGNOSIS — I10 Essential (primary) hypertension: Secondary | ICD-10-CM

## 2019-01-10 MED ORDER — LISINOPRIL 20 MG PO TABS: 20.0000 mg | ORAL_TABLET | Freq: Every day | ORAL | 11 refills | Status: DC

## 2019-01-10 NOTE — Progress Notes (Signed)
RFV: follow up for hiv disease  Patient ID: [name], male   DOB: August 08, 1955, 63 y.o.   MRN: [id number]  HPI [name] is a 63yo M with hiv disease, CD 4 count of 696/ VL 31 on June 2020, currently On symtuza. Taking meds daily. No compliants. No recent illnesses.  Will stop smoking- he reports still smoking a 1/4 pack per day  Outpatient Encounter Medications as of 01/10/2019  Medication Sig  . amLODipine (NORVASC) 10 MG tablet Take 1 tablet (10 mg total) by mouth daily.  . Darunavir-Cobicisctat-Emtricitabine-Tenofovir Alafenamide (SYMTUZA) 800-150-200-10 MG TABS Take 1 tablet by mouth daily with breakfast.  . gabapentin (NEURONTIN) 300 MG capsule Take 1 capsule (300 mg total) by mouth 2 (two) times daily.  . hydrochlorothiazide (HYDRODIURIL) 25 MG tablet Take 1 tablet (25 mg total) by mouth daily.  . imiquimod (ALDARA) 5 % cream Apply topically 3 (three) times a week. Apply at bedtime on affected area and wash off in the morning  . traMADol (ULTRAM) 50 MG tablet Take 1 tablet (50 mg total) by mouth every 6 (six) hours as needed for moderate pain.   No facility-administered encounter medications on file as of 01/10/2019.      Patient Active Problem List   Diagnosis Date Noted  . Perirectal abscess 01/26/2017  . Diarrhea 01/12/2017  . Genital herpes 03/12/2014  . Penile wart 03/12/2014  . Urethritis, nongonococcal 03/06/2013  . HYPERLIPIDEMIA 05/02/2007  . WEIGHT LOSS 08/30/2006  . HIV (human immunodeficiency virus infection) (HCC) 05/30/2006  . Essential hypertension 05/30/2006  . DENTAL CARIES 05/30/2006  . METHICILLIN RESISTANT STAPHYLOCOCCUS AUREUS INFECTION 10/31/2004     Health Maintenance Due  Topic Date Due  . COLONOSCOPY  09/11/2006  . 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 He appears well-developed and well-nourished. No distress.  HENT:  Mouth/Throat: Oropharynx is clear and moist. No oropharyngeal exudate.  Cardiovascular: Normal rate, regular rhythm and normal heart sounds. Exam reveals no gallop and no friction rub.  No murmur heard.  Pulmonary/Chest: Effort normal and breath sounds normal. No respiratory distress. He has no wheezes.  Lymphadenopathy:  He has no cervical adenopathy.  Neurological: He is alert and oriented to person, place, and time.  Skin: Skin is warm and dry. No rash noted. No erythema.  Psychiatric: He has a normal mood and affect. His behavior is normal.    Lab Results  Component Value Date   CD4TCELL 27 (L) 08/30/2018   Lab Results  Component Value Date   CD4TABS 696 08/30/2018   CD4TABS 630 02/20/2018   CD4TABS 710 12/04/2017   Lab Results  Component Value Date   HIV1RNAQUANT 31 (H) 10/10/2018   Lab Results  Component Value Date   HEPBSAB YES 06/12/2006   Lab Results  Component Value Date   LABRPR NON-REACTIVE 12/04/2017    CBC Lab Results  Component Value Date   WBC 5.5 08/30/2018   RBC 4.38 08/30/2018   HGB 13.3 08/30/2018   HCT 39.0 08/30/2018   PLT 354 08/30/2018   MCV 89.0 08/30/2018   MCH 30.4 08/30/2018   MCHC 34.1 08/30/2018   RDW 12.2 08/30/2018   LYMPHSABS 2,580 08/30/2018   MONOABS 0.9 01/26/2017   EOSABS 110 08/30/2018    BMET Lab Results  Component Value Date   NA 141 08/30/2018   K 4.3 08/30/2018   CL 107 08/30/2018   CO2 29 08/30/2018   GLUCOSE 102 (H) 08/30/2018   BUN 12 08/30/2018   CREATININE 1.20 08/30/2018   CALCIUM 9.8 08/30/2018   GFRNONAA 65 08/30/2018   GFRAA 75 08/30/2018      Assessment and Plan   history of perianal cutaneous fistulaopen  = he is planning to go back to [place] [facility] for evaluation and repair.   htn =poorly controlled. Will add lisinopril 20mg  daily. Will have him come next week for bp check nad repeat bmp to see cr  and potassium WNL  hiv = will check labs to see if undetectable  Hx of syphilis = will check rpr  Health maintenance = will give flu shot today. Smoking cessation counseling offered

## 2019-01-11 LAB — T-HELPER CELL (CD4) - (RCID CLINIC ONLY)
CD4 % Helper T Cell: 29 % — ABNORMAL LOW (ref 33–65)
CD4 T Cell Abs: 921 /uL (ref 400–1790)

## 2019-01-16 LAB — HIV-1 RNA QUANT-NO REFLEX-BLD
HIV 1 RNA Quant: 38 copies/mL — ABNORMAL HIGH
HIV-1 RNA Quant, Log: 1.58 Log copies/mL — ABNORMAL HIGH

## 2019-01-16 LAB — RPR: RPR Ser Ql: NONREACTIVE

## 2019-04-09 ENCOUNTER — Other Ambulatory Visit: Payer: Self-pay

## 2019-04-09 ENCOUNTER — Other Ambulatory Visit: Payer: Self-pay | Admitting: *Deleted

## 2019-04-09 DIAGNOSIS — B20 Human immunodeficiency virus [HIV] disease: Secondary | ICD-10-CM

## 2019-04-10 LAB — T-HELPER CELL (CD4) - (RCID CLINIC ONLY)
CD4 % Helper T Cell: 27 % — ABNORMAL LOW (ref 33–65)
CD4 T Cell Abs: 736 /uL (ref 400–1790)

## 2019-04-16 LAB — CBC WITH DIFFERENTIAL/PLATELET
Absolute Monocytes: 676 cells/uL (ref 200–950)
Basophils Absolute: 52 cells/uL (ref 0–200)
Basophils Relative: 0.8 %
Eosinophils Absolute: 137 cells/uL (ref 15–500)
Eosinophils Relative: 2.1 %
HCT: 36.6 % — ABNORMAL LOW (ref 38.5–50.0)
Hemoglobin: 12.5 g/dL — ABNORMAL LOW (ref 13.2–17.1)
Lymphs Abs: 2802 cells/uL (ref 850–3900)
MCH: 31.1 pg (ref 27.0–33.0)
MCHC: 34.2 g/dL (ref 32.0–36.0)
MCV: 91 fL (ref 80.0–100.0)
MPV: 9.4 fL (ref 7.5–12.5)
Monocytes Relative: 10.4 %
Neutro Abs: 2834 cells/uL (ref 1500–7800)
Neutrophils Relative %: 43.6 %
Platelets: 415 10*3/uL — ABNORMAL HIGH (ref 140–400)
RBC: 4.02 10*6/uL — ABNORMAL LOW (ref 4.20–5.80)
RDW: 12.5 % (ref 11.0–15.0)
Total Lymphocyte: 43.1 %
WBC: 6.5 10*3/uL (ref 3.8–10.8)

## 2019-04-16 LAB — COMPLETE METABOLIC PANEL WITH GFR
AG Ratio: 1.1 (calc) (ref 1.0–2.5)
ALT: 16 U/L (ref 9–46)
AST: 14 U/L (ref 10–35)
Albumin: 3.9 g/dL (ref 3.6–5.1)
Alkaline phosphatase (APISO): 98 U/L (ref 35–144)
BUN/Creatinine Ratio: 12 (calc) (ref 6–22)
BUN: 18 mg/dL (ref 7–25)
CO2: 25 mmol/L (ref 20–32)
Calcium: 9.7 mg/dL (ref 8.6–10.3)
Chloride: 106 mmol/L (ref 98–110)
Creat: 1.45 mg/dL — ABNORMAL HIGH (ref 0.70–1.25)
GFR, Est African American: 59 mL/min/{1.73_m2} — ABNORMAL LOW (ref >=60)
GFR, Est Non African American: 51 mL/min/{1.73_m2} — ABNORMAL LOW (ref >=60)
Globulin: 3.6 g/dL (calc) (ref 1.9–3.7)
Glucose, Bld: 102 mg/dL — ABNORMAL HIGH (ref 65–99)
Potassium: 4.2 mmol/L (ref 3.5–5.3)
Sodium: 140 mmol/L (ref 135–146)
Total Bilirubin: 0.3 mg/dL (ref 0.2–1.2)
Total Protein: 7.5 g/dL (ref 6.1–8.1)

## 2019-04-16 LAB — HIV-1 RNA QUANT-NO REFLEX-BLD
HIV 1 RNA Quant: <20 copies/mL
HIV-1 RNA Quant, Log: <1.3 Log copies/mL

## 2019-04-23 ENCOUNTER — Encounter: Payer: Self-pay | Admitting: Internal Medicine

## 2019-04-30 ENCOUNTER — Other Ambulatory Visit: Payer: Self-pay

## 2019-04-30 ENCOUNTER — Ambulatory Visit (INDEPENDENT_AMBULATORY_CARE_PROVIDER_SITE_OTHER): Payer: Self-pay | Admitting: Internal Medicine

## 2019-04-30 VITALS — BP 155/82 | HR 78 | Temp 98.2°F | Ht 72.0 in | Wt 177.0 lb

## 2019-04-30 DIAGNOSIS — K604 Rectal fistula: Secondary | ICD-10-CM

## 2019-04-30 DIAGNOSIS — B20 Human immunodeficiency virus [HIV] disease: Secondary | ICD-10-CM

## 2019-04-30 DIAGNOSIS — I10 Essential (primary) hypertension: Secondary | ICD-10-CM

## 2019-04-30 NOTE — Progress Notes (Signed)
RFV: follow up on hiv disease  Patient ID: [name], male   DOB: [birth year], 64 y.o.   MRN: [id number]  HPI  64yo M with hiv disease on symtuza. 736/VL<20 great work. He reports excellent adherence. Overall good health except that he has Unilateral HA, hasn't check vision test recently.  BP elevated here  Outpatient Encounter Medications as of 04/30/2019  Medication Sig  . amLODipine (NORVASC) 10 MG tablet Take 1 tablet (10 mg total) by mouth daily.  . Darunavir-Cobicisctat-Emtricitabine-Tenofovir Alafenamide (SYMTUZA) 800-150-200-10 MG TABS Take 1 tablet by mouth daily with breakfast.  . gabapentin (NEURONTIN) 300 MG capsule Take 1 capsule (300 mg total) by mouth 2 (two) times daily.  . hydrochlorothiazide (HYDRODIURIL) 25 MG tablet Take 1 tablet (25 mg total) by mouth daily.  . imiquimod (ALDARA) 5 % cream Apply topically 3 (three) times a week. Apply at bedtime on affected area and wash off in the morning  . traMADol (ULTRAM) 50 MG tablet Take 1 tablet (50 mg total) by mouth every 6 (six) hours as needed for moderate pain.  Marland Kitchen lisinopril (ZESTRIL) 20 MG tablet Take 1 tablet (20 mg total) by mouth daily. (Patient not taking: Reported on 04/30/2019)   No facility-administered encounter medications on file as of 04/30/2019.     Patient Active Problem List   Diagnosis Date Noted  . Perirectal abscess 01/26/2017  . Diarrhea 01/12/2017  . Genital herpes 03/12/2014  . Penile wart 03/12/2014  . Urethritis, nongonococcal 03/06/2013  . HYPERLIPIDEMIA 05/02/2007  . WEIGHT LOSS 08/30/2006  . HIV (human immunodeficiency virus infection) (HCC) 05/30/2006  . Essential hypertension 05/30/2006  . DENTAL CARIES 05/30/2006  . METHICILLIN RESISTANT STAPHYLOCOCCUS AUREUS INFECTION 10/31/2004     Health Maintenance Due  Topic Date Due  . COLONOSCOPY  09/11/2006     Review of Systems Review of Systems  Constitutional: Negative for fever, chills, diaphoresis, activity change, appetite  change, fatigue and unexpected weight change.  HENT: Negative for congestion, sore throat, rhinorrhea, sneezing, trouble swallowing and sinus pressure.  Eyes: Negative for photophobia and visual disturbance.  Respiratory: Negative for cough, chest tightness, shortness of breath, wheezing and stridor.  Cardiovascular: Negative for chest pain, palpitations and leg swelling.  Gastrointestinal: Negative for nausea, vomiting, abdominal pain, diarrhea, constipation, blood in stool, abdominal distention and anal bleeding.  Genitourinary: Negative for dysuria, hematuria, flank pain and difficulty urinating.  Musculoskeletal: Negative for myalgias, back pain, joint swelling, arthralgias and gait problem.  Skin: Negative for color change, pallor, rash and wound.  Neurological: +headache Hematological: Negative for adenopathy. Does not bruise/bleed easily.  Psychiatric/Behavioral: Negative for behavioral problems, confusion, sleep disturbance, dysphoric mood, decreased concentration and agitation.    Physical Exam   BP (!) 155/82   Pulse 78   Temp 98.2 F (36.8 C) (Oral)   Ht 6' (1.829 m)   Wt 177 lb (80.3 kg)   SpO2 99%   BMI 24.01 kg/m   Physical Exam  Constitutional: He is oriented to person, place, and time. He appears well-developed and well-nourished. No distress.  HENT:  Mouth/Throat: Oropharynx is clear and moist. No oropharyngeal exudate.  Cardiovascular: Normal rate, regular rhythm and normal heart sounds. Exam reveals no gallop and no friction rub.  No murmur heard.  Pulmonary/Chest: Effort normal and breath sounds normal. No respiratory distress. He has no wheezes.  Abdominal: Soft. Bowel sounds are normal. He exhibits no distension. There is no tenderness.  Lymphadenopathy:  He has no cervical adenopathy.  Neurological: He is alert and  oriented to person, place, and time.  Skin: Skin is warm and dry. No rash noted. No erythema.  Psychiatric: He has a normal mood and affect. His  behavior is normal.    Lab Results  Component Value Date   CD4TCELL 27 (L) 04/09/2019   Lab Results  Component Value Date   CD4TABS 736 04/09/2019   CD4TABS 921 01/10/2019   CD4TABS 696 08/30/2018   Lab Results  Component Value Date   HIV1RNAQUANT <20 NOT DETECTED 04/09/2019   Lab Results  Component Value Date   HEPBSAB YES 06/12/2006   Lab Results  Component Value Date   LABRPR NON-REACTIVE 01/10/2019    CBC Lab Results  Component Value Date   WBC 6.5 04/09/2019   RBC 4.02 (L) 04/09/2019   HGB 12.5 (L) 04/09/2019   HCT 36.6 (L) 04/09/2019   PLT 415 (H) 04/09/2019   MCV 91.0 04/09/2019   MCH 31.1 04/09/2019   MCHC 34.2 04/09/2019   RDW 12.5 04/09/2019   LYMPHSABS 2,802 04/09/2019   MONOABS 0.9 01/26/2017   EOSABS 137 04/09/2019    BMET Lab Results  Component Value Date   NA 140 04/09/2019   K 4.2 04/09/2019   CL 106 04/09/2019   CO2 25 04/09/2019   GLUCOSE 102 (H) 04/09/2019   BUN 18 04/09/2019   CREATININE 1.45 (H) 04/09/2019   CALCIUM 9.7 04/09/2019   GFRNONAA 51 (L) 04/09/2019   GFRAA 59 (L) 04/09/2019      Assessment and Plan  HTN = needs BP cuff to check at home, will monitor at home to see if need to adjust meds vs. 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  hiv disease = well controlled. Continue on symtuza

## 2019-06-05 ENCOUNTER — Other Ambulatory Visit: Payer: Self-pay

## 2019-06-05 ENCOUNTER — Telehealth: Payer: Self-pay

## 2019-06-05 NOTE — Telephone Encounter (Signed)
Received VM in triage from pharmacy regarding if patient was currently taking Lisinopril.  Per last notes Lisinopril was discontinue by Dr. Drue Second on 04/30/19.  Called patient and verified he was no longer taking Lisinopril. Patient states he has not been checking his blood pressure, but has not had any "dizzy spells" or "felt any pressure behind his eyes" . Advise patient that he could stop by a [facility] pharmacy anytime to check his blood pressure just to keep track of it.  Patient was very appreciative of phone call.   Returned call to pharmacy and verified patient is no longer taking Lisinopril and D/C by Dr. Drue Second.   Valarie Cones

## 2019-07-26 ENCOUNTER — Telehealth: Payer: Self-pay

## 2019-07-26 NOTE — Telephone Encounter (Signed)
COVID-19 Pre-Screening Questions:07/26/19  Do you currently have a fever (>100 F), chills or unexplained body aches?NO   Are you currently experiencing new cough, shortness of breath, sore throat, runny nose?NO  .  Have you recently travelled outside the state of [place] in the last 14 days? NO  .  Have you been in contact with someone that is currently pending confirmation of Covid19 testing or has been confirmed to have the Covid19 virus? NO  **If the patient answers NO to ALL questions -  advise the patient to please call the clinic before coming to the office should any symptoms develop.     

## 2019-07-29 ENCOUNTER — Encounter: Payer: Self-pay | Admitting: Internal Medicine

## 2019-07-29 ENCOUNTER — Ambulatory Visit (INDEPENDENT_AMBULATORY_CARE_PROVIDER_SITE_OTHER): Payer: Self-pay | Admitting: Internal Medicine

## 2019-07-29 ENCOUNTER — Other Ambulatory Visit: Payer: Self-pay

## 2019-07-29 ENCOUNTER — Ambulatory Visit: Payer: Self-pay

## 2019-07-29 VITALS — BP 139/80 | HR 78 | Temp 98.0°F | Ht 71.0 in | Wt 174.0 lb

## 2019-07-29 DIAGNOSIS — Z79899 Other long term (current) drug therapy: Secondary | ICD-10-CM

## 2019-07-29 DIAGNOSIS — B20 Human immunodeficiency virus [HIV] disease: Secondary | ICD-10-CM

## 2019-07-29 DIAGNOSIS — Z789 Other specified health status: Secondary | ICD-10-CM

## 2019-07-29 DIAGNOSIS — N183 Chronic kidney disease, stage 3 unspecified: Secondary | ICD-10-CM

## 2019-07-29 NOTE — Patient Instructions (Signed)
  COVID VACCINE AT [facility]:   Thu., 08/01/2019 10:15am - 10:30am EDT Location: [facility] ([address]

## 2019-07-29 NOTE — Progress Notes (Signed)
RFV: follow up for hiv disease  Patient ID: [name], male   DOB: Oct 29, 1955, 64 y.o.   MRN: [id number]  HPI 64yo M with hiv disease, on symtuza, CD 4 count of 736/VL<20 in Dec 2020, also hx of HTN, perianal fistula, CKD 3. Doing well.   Minimal drainage from perianal fistula track. No redness, not  Foul smelling. Awaiting change in insurance to see if can get surgery  Otherwise no problems. Outpatient Encounter Medications as of 07/29/2019  Medication Sig  . amLODipine (NORVASC) 10 MG tablet Take 1 tablet (10 mg total) by mouth daily.  . Darunavir-Cobicisctat-Emtricitabine-Tenofovir Alafenamide (SYMTUZA) 800-150-200-10 MG TABS Take 1 tablet by mouth daily with breakfast.  . gabapentin (NEURONTIN) 300 MG capsule Take 1 capsule (300 mg total) by mouth 2 (two) times daily.  . hydrochlorothiazide (HYDRODIURIL) 25 MG tablet Take 1 tablet (25 mg total) by mouth daily.  . imiquimod (ALDARA) 5 % cream Apply topically 3 (three) times a week. Apply at bedtime on affected area and wash off in the morning (Patient not taking: Reported on 07/29/2019)  . traMADol (ULTRAM) 50 MG tablet Take 1 tablet (50 mg total) by mouth every 6 (six) hours as needed for moderate pain. (Patient not taking: Reported on 07/29/2019)   No facility-administered encounter medications on file as of 07/29/2019.     Patient Active Problem List   Diagnosis Date Noted  . Perirectal abscess 01/26/2017  . Diarrhea 01/12/2017  . Genital herpes 03/12/2014  . Penile wart 03/12/2014  . Urethritis, nongonococcal 03/06/2013  . HYPERLIPIDEMIA 05/02/2007  . WEIGHT LOSS 08/30/2006  . HIV (human immunodeficiency virus infection) (HCC) 05/30/2006  . Essential hypertension 05/30/2006  . DENTAL CARIES 05/30/2006  . METHICILLIN RESISTANT STAPHYLOCOCCUS AUREUS INFECTION 10/31/2004     Health Maintenance Due  Topic Date Due  . COLONOSCOPY  Never done    Social History   Tobacco Use  . Smoking status: Former Smoker   Packs/day: 0.25    Years: 6.00    Pack years: 1.50    Types: Cigarettes  . Smokeless tobacco: Never Used  . Tobacco comment: quit 20 yrs ago  Substance Use Topics  . Alcohol use: Yes    Alcohol/week: 0.0 standard drinks    Comment: occ  . Drug use: Yes    Frequency: 3.0 times per week    Types: Marijuana    Comment: occ marijuana   Review of Systems  Constitutional: Negative for fever, chills, diaphoresis, activity change, appetite change, fatigue and unexpected weight change.  HENT: Negative for congestion, sore throat, rhinorrhea, sneezing, trouble swallowing and sinus pressure.  Eyes: Negative for photophobia and visual disturbance.  Respiratory: Negative for cough, chest tightness, shortness of breath, wheezing and stridor.  Cardiovascular: Negative for chest pain, palpitations and leg swelling.  Gastrointestinal: Negative for nausea, vomiting, abdominal pain, diarrhea, constipation, blood in stool, abdominal distention and anal bleeding.  Genitourinary: Negative for dysuria, hematuria, flank pain and difficulty urinating.  Musculoskeletal: Negative for myalgias, back pain, joint swelling, arthralgias and gait problem.  Skin: Negative for color change, pallor, rash and wound.  Neurological: Negative for dizziness, tremors, weakness and light-headedness.  Hematological: Negative for adenopathy. Does not bruise/bleed easily.  Psychiatric/Behavioral: Negative for behavioral problems, confusion, sleep disturbance, dysphoric mood, decreased concentration and agitation.    Physical Exam   Ht 5\' 11"  (1.803 m)   Wt 174 lb (78.9 kg)   BMI 24.27 kg/m   Physical Exam  Constitutional: He is oriented to person, place, and time.  He appears well-developed and well-nourished. No distress.  HENT:  Mouth/Throat: Oropharynx is clear and moist. No oropharyngeal exudate.  Cardiovascular: Normal rate, regular rhythm and normal heart sounds. Exam reveals no gallop and no friction rub.  No  murmur heard.  Pulmonary/Chest: Effort normal and breath sounds normal. No respiratory distress. He has no wheezes.   Lymphadenopathy:  He has no cervical adenopathy.  Neurological: He is alert and oriented to person, place, and time.  Skin: Skin is warm and dry. No rash noted. No erythema.  Psychiatric: He has a normal mood and affect. His behavior is normal.    Lab Results  Component Value Date   CD4TCELL 27 (L) 04/09/2019   Lab Results  Component Value Date   CD4TABS 736 04/09/2019   CD4TABS 921 01/10/2019   CD4TABS 696 08/30/2018   Lab Results  Component Value Date   HIV1RNAQUANT <20 NOT DETECTED 04/09/2019   Lab Results  Component Value Date   HEPBSAB YES 06/12/2006   Lab Results  Component Value Date   LABRPR NON-REACTIVE 01/10/2019    CBC Lab Results  Component Value Date   WBC 6.5 04/09/2019   RBC 4.02 (L) 04/09/2019   HGB 12.5 (L) 04/09/2019   HCT 36.6 (L) 04/09/2019   PLT 415 (H) 04/09/2019   MCV 91.0 04/09/2019   MCH 31.1 04/09/2019   MCHC 34.2 04/09/2019   RDW 12.5 04/09/2019   LYMPHSABS 2,802 04/09/2019   MONOABS 0.9 01/26/2017   EOSABS 137 04/09/2019    BMET Lab Results  Component Value Date   NA 140 04/09/2019   K 4.2 04/09/2019   CL 106 04/09/2019   CO2 25 04/09/2019   GLUCOSE 102 (H) 04/09/2019   BUN 18 04/09/2019   CREATININE 1.45 (H) 04/09/2019   CALCIUM 9.7 04/09/2019   GFRNONAA 51 (L) 04/09/2019   GFRAA 59 (L) 04/09/2019      Assessment and Plan  HIV disease= will check labs  CKD 3= will check cr   Hyperlipidemia= fasting today to check lipids  Made a covid vaccine appt. For him

## 2019-07-30 LAB — T-HELPER CELL (CD4) - (RCID CLINIC ONLY)
CD4 % Helper T Cell: 28 % — ABNORMAL LOW (ref 33–65)
CD4 T Cell Abs: 597 /uL (ref 400–1790)

## 2019-07-31 LAB — COMPLETE METABOLIC PANEL WITH GFR
AG Ratio: 1.1 (calc) (ref 1.0–2.5)
ALT: 19 U/L (ref 9–46)
AST: 19 U/L (ref 10–35)
Albumin: 4.1 g/dL (ref 3.6–5.1)
Alkaline phosphatase (APISO): 94 U/L (ref 35–144)
BUN/Creatinine Ratio: 9 (calc) (ref 6–22)
BUN: 12 mg/dL (ref 7–25)
CO2: 29 mmol/L (ref 20–32)
Calcium: 10.3 mg/dL (ref 8.6–10.3)
Chloride: 106 mmol/L (ref 98–110)
Creat: 1.33 mg/dL — ABNORMAL HIGH (ref 0.70–1.25)
GFR, Est African American: 66 mL/min/{1.73_m2} (ref >=60)
GFR, Est Non African American: 57 mL/min/{1.73_m2} — ABNORMAL LOW (ref >=60)
Globulin: 3.7 g/dL (calc) (ref 1.9–3.7)
Glucose, Bld: 103 mg/dL — ABNORMAL HIGH (ref 65–99)
Potassium: 4.8 mmol/L (ref 3.5–5.3)
Sodium: 142 mmol/L (ref 135–146)
Total Bilirubin: 0.4 mg/dL (ref 0.2–1.2)
Total Protein: 7.8 g/dL (ref 6.1–8.1)

## 2019-07-31 LAB — CBC WITH DIFFERENTIAL/PLATELET
Absolute Monocytes: 486 cells/uL (ref 200–950)
Basophils Absolute: 38 cells/uL (ref 0–200)
Basophils Relative: 0.7 %
Eosinophils Absolute: 130 cells/uL (ref 15–500)
Eosinophils Relative: 2.4 %
HCT: 37.1 % — ABNORMAL LOW (ref 38.5–50.0)
Hemoglobin: 12.3 g/dL — ABNORMAL LOW (ref 13.2–17.1)
Lymphs Abs: 2230 cells/uL (ref 850–3900)
MCH: 30.5 pg (ref 27.0–33.0)
MCHC: 33.2 g/dL (ref 32.0–36.0)
MCV: 92.1 fL (ref 80.0–100.0)
MPV: 9.5 fL (ref 7.5–12.5)
Monocytes Relative: 9 %
Neutro Abs: 2516 cells/uL (ref 1500–7800)
Neutrophils Relative %: 46.6 %
Platelets: 373 10*3/uL (ref 140–400)
RBC: 4.03 10*6/uL — ABNORMAL LOW (ref 4.20–5.80)
RDW: 12.3 % (ref 11.0–15.0)
Total Lymphocyte: 41.3 %
WBC: 5.4 10*3/uL (ref 3.8–10.8)

## 2019-07-31 LAB — LIPID PANEL
Cholesterol: 215 mg/dL — ABNORMAL HIGH (ref <200)
HDL: 42 mg/dL (ref >=40)
LDL Cholesterol (Calc): 146 mg/dL (calc) — ABNORMAL HIGH
Non-HDL Cholesterol (Calc): 173 mg/dL (calc) — ABNORMAL HIGH (ref <130)
Total CHOL/HDL Ratio: 5.1 (calc) — ABNORMAL HIGH (ref <5.0)
Triglycerides: 138 mg/dL (ref <150)

## 2019-07-31 LAB — HIV-1 RNA QUANT-NO REFLEX-BLD
HIV 1 RNA Quant: <20 copies/mL — AB
HIV-1 RNA Quant, Log: <1.3 Log copies/mL — AB

## 2019-07-31 LAB — RPR: RPR Ser Ql: NONREACTIVE

## 2019-08-01 ENCOUNTER — Ambulatory Visit: Payer: Self-pay | Attending: Family

## 2019-08-01 DIAGNOSIS — Z23 Encounter for immunization: Secondary | ICD-10-CM

## 2019-08-01 NOTE — Progress Notes (Signed)
    Covid-19 Vaccination Clinic  Name:  [name]    MRN: [id number] DOB: 20-Oct-1955  08/01/2019  [name] was observed post Covid-19 immunization for 15 minutes without incident. He was provided with Vaccine Information Sheet and instruction to access the V-Safe system.   [name] was instructed to call 911 with any severe reactions post vaccine: Marland Kitchen Difficulty breathing  . Swelling of face and throat  . A fast heartbeat  . A bad rash all over body  . Dizziness and weakness   Immunizations Administered    Name Date Dose VIS Date Route   Moderna COVID-19 Vaccine 08/01/2019 10:30 AM 0.5 mL 03/19/2019 Intramuscular   Manufacturer: Moderna   Lot: [id number]   NDC: 49675-916-38

## 2019-09-03 ENCOUNTER — Ambulatory Visit: Payer: Self-pay | Attending: Family

## 2019-09-03 ENCOUNTER — Ambulatory Visit: Payer: Self-pay

## 2019-09-03 DIAGNOSIS — Z23 Encounter for immunization: Secondary | ICD-10-CM

## 2019-09-03 NOTE — Progress Notes (Signed)
   Covid-19 Vaccination Clinic  Name:  DELMAS FAUCETT    MRN: [id number] DOB: March 27, 1956  09/03/2019  Mr. Teagle was observed post Covid-19 immunization for 15 minutes without incident. He was provided with Vaccine Information Sheet and instruction to access the V-Safe system.   Mr. Bacallao was instructed to call 911 with any severe reactions post vaccine: Marland Kitchen Difficulty breathing  . Swelling of face and throat  . A fast heartbeat  . A bad rash all over body  . Dizziness and weakness   Immunizations Administered    Name Date Dose VIS Date Route   Moderna COVID-19 Vaccine 09/03/2019  1:31 PM 0.5 mL 03/2019 Intramuscular   Manufacturer: Moderna   Lot: [id number]   NDC: 38250-539-76

## 2019-11-07 ENCOUNTER — Other Ambulatory Visit: Payer: Self-pay | Admitting: Internal Medicine

## 2019-11-07 DIAGNOSIS — K611 Rectal abscess: Secondary | ICD-10-CM

## 2019-11-07 DIAGNOSIS — I10 Essential (primary) hypertension: Secondary | ICD-10-CM

## 2019-12-24 ENCOUNTER — Other Ambulatory Visit: Payer: Self-pay

## 2020-01-06 ENCOUNTER — Other Ambulatory Visit: Payer: Self-pay

## 2020-01-06 ENCOUNTER — Ambulatory Visit: Payer: Self-pay

## 2020-01-06 DIAGNOSIS — B20 Human immunodeficiency virus [HIV] disease: Secondary | ICD-10-CM

## 2020-01-07 ENCOUNTER — Encounter: Payer: Self-pay | Admitting: Internal Medicine

## 2020-01-09 ENCOUNTER — Other Ambulatory Visit: Payer: Self-pay

## 2020-01-09 ENCOUNTER — Ambulatory Visit: Payer: Self-pay

## 2020-01-09 DIAGNOSIS — B20 Human immunodeficiency virus [HIV] disease: Secondary | ICD-10-CM

## 2020-01-10 ENCOUNTER — Encounter: Payer: Self-pay | Admitting: Internal Medicine

## 2020-01-10 LAB — T-HELPER CELL (CD4) - (RCID CLINIC ONLY)
CD4 % Helper T Cell: 29 % — ABNORMAL LOW (ref 33–65)
CD4 T Cell Abs: 799 /uL (ref 400–1790)

## 2020-01-12 LAB — COMPREHENSIVE METABOLIC PANEL
AG Ratio: 1.1 (calc) (ref 1.0–2.5)
ALT: 15 U/L (ref 9–46)
AST: 18 U/L (ref 10–35)
Albumin: 3.8 g/dL (ref 3.6–5.1)
Alkaline phosphatase (APISO): 82 U/L (ref 35–144)
BUN: 13 mg/dL (ref 7–25)
CO2: 28 mmol/L (ref 20–32)
Calcium: 9.7 mg/dL (ref 8.6–10.3)
Chloride: 108 mmol/L (ref 98–110)
Creat: 1.22 mg/dL (ref 0.70–1.25)
Globulin: 3.4 g/dL (calc) (ref 1.9–3.7)
Glucose, Bld: 91 mg/dL (ref 65–99)
Potassium: 4.1 mmol/L (ref 3.5–5.3)
Sodium: 142 mmol/L (ref 135–146)
Total Bilirubin: 0.5 mg/dL (ref 0.2–1.2)
Total Protein: 7.2 g/dL (ref 6.1–8.1)

## 2020-01-12 LAB — HIV-1 RNA QUANT-NO REFLEX-BLD
HIV 1 RNA Quant: <20 Copies/mL
HIV-1 RNA Quant, Log: <1.3 Log cps/mL

## 2020-01-12 LAB — CBC WITH DIFFERENTIAL/PLATELET
Absolute Monocytes: 620 cells/uL (ref 200–950)
Basophils Absolute: 62 cells/uL (ref 0–200)
Basophils Relative: 1 %
Eosinophils Absolute: 279 cells/uL (ref 15–500)
Eosinophils Relative: 4.5 %
HCT: 35.5 % — ABNORMAL LOW (ref 38.5–50.0)
Hemoglobin: 12.1 g/dL — ABNORMAL LOW (ref 13.2–17.1)
Lymphs Abs: 2809 cells/uL (ref 850–3900)
MCH: 31.9 pg (ref 27.0–33.0)
MCHC: 34.1 g/dL (ref 32.0–36.0)
MCV: 93.7 fL (ref 80.0–100.0)
MPV: 9.7 fL (ref 7.5–12.5)
Monocytes Relative: 10 %
Neutro Abs: 2430 cells/uL (ref 1500–7800)
Neutrophils Relative %: 39.2 %
Platelets: 351 10*3/uL (ref 140–400)
RBC: 3.79 10*6/uL — ABNORMAL LOW (ref 4.20–5.80)
RDW: 13.1 % (ref 11.0–15.0)
Total Lymphocyte: 45.3 %
WBC: 6.2 10*3/uL (ref 3.8–10.8)

## 2020-01-20 ENCOUNTER — Ambulatory Visit (INDEPENDENT_AMBULATORY_CARE_PROVIDER_SITE_OTHER): Payer: Self-pay | Admitting: Internal Medicine

## 2020-01-20 ENCOUNTER — Other Ambulatory Visit: Payer: Self-pay

## 2020-01-20 VITALS — Wt 177.0 lb

## 2020-01-20 DIAGNOSIS — Z79899 Other long term (current) drug therapy: Secondary | ICD-10-CM

## 2020-01-20 DIAGNOSIS — B20 Human immunodeficiency virus [HIV] disease: Secondary | ICD-10-CM

## 2020-01-20 DIAGNOSIS — J111 Influenza due to unidentified influenza virus with other respiratory manifestations: Secondary | ICD-10-CM

## 2020-01-20 DIAGNOSIS — Z23 Encounter for immunization: Secondary | ICD-10-CM

## 2020-01-20 MED ORDER — SYMTUZA 800-150-200-10 MG PO TABS
1.0000 | ORAL_TABLET | Freq: Every day | ORAL | 11 refills | Status: DC
Start: 2020-01-20 — End: 2020-08-19

## 2020-01-20 NOTE — Progress Notes (Signed)
RFV: follow up for hiv disease  Patient ID: [name], male   DOB: 01-07-1956, 64 y.o.   MRN: [id number]  HPI Cd 4 count 799/VL<20 (sep 2021) doing great on symtuza,  Feels like he has a cold, - when did it start Tuesday 6 days ago.  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 He keeps covered and no irritation recently  Nasal congestion, cough, headache, some wheezing of late  Received moderna   Outpatient Encounter Medications as of 01/20/2020  Medication Sig  . amLODipine (NORVASC) 10 MG tablet TAKE 1 TABLET(10 MG) BY MOUTH DAILY  . gabapentin (NEURONTIN) 300 MG capsule TAKE 1 CAPSULE(300 MG) BY MOUTH TWICE DAILY  . hydrochlorothiazide (HYDRODIURIL) 25 MG tablet TAKE 1 TABLET(25 MG) BY MOUTH DAILY  . imiquimod (ALDARA) 5 % cream Apply topically 3 (three) times a week. Apply at bedtime on affected area and wash off in the morning  . SYMTUZA 800-150-200-10 MG TABS TAKE 1 TABLET BY MOUTH DAILY WITH BREAKFAST  . traMADol (ULTRAM) 50 MG tablet Take 1 tablet (50 mg total) by mouth every 6 (six) hours as needed for moderate pain.   No facility-administered encounter medications on file as of 01/20/2020.     Patient Active Problem List   Diagnosis Date Noted  . Perirectal abscess 01/26/2017  . Diarrhea 01/12/2017  . Genital herpes 03/12/2014  . Penile wart 03/12/2014  . Urethritis, nongonococcal 03/06/2013  . HYPERLIPIDEMIA 05/02/2007  . WEIGHT LOSS 08/30/2006  . HIV (human immunodeficiency virus infection) (HCC) 05/30/2006  . Essential hypertension 05/30/2006  . DENTAL CARIES 05/30/2006  . METHICILLIN RESISTANT STAPHYLOCOCCUS AUREUS INFECTION 10/31/2004     Health Maintenance Due  Topic Date Due  . COLONOSCOPY  Never done  . INFLUENZA VACCINE  11/17/2019     Review of Systems Review of Systems  Constitutional: Negative for fever, chills, diaphoresis, activity change, appetite change, fatigue and  unexpected weight change.  HENT: positive for congestion, sore throat, rhinorrhea, sneezing, trouble swallowing and sinus pressure.  Eyes: Negative for photophobia and visual disturbance.  Respiratory: Negative for cough, chest tightness, shortness of breath, wheezing and stridor.  Cardiovascular: Negative for chest pain, palpitations and leg swelling.  Gastrointestinal: Negative for nausea, vomiting, abdominal pain, diarrhea, constipation, blood in stool, abdominal distention and anal bleeding.  Genitourinary: Negative for dysuria, hematuria, flank pain and difficulty urinating.  Musculoskeletal: Negative for myalgias, back pain, joint swelling, arthralgias and gait problem.  Skin: Negative for color change, pallor, rash and wound.  Neurological: Negative for dizziness, tremors, weakness and light-headedness.  Hematological: Negative for adenopathy. Does not bruise/bleed easily.  Psychiatric/Behavioral: Negative for behavioral problems, confusion, sleep disturbance, dysphoric mood, decreased concentration and agitation.    Physical Exam   Wt 177 lb (80.3 kg)   BMI 24.69 kg/m   Physical Exam  Constitutional: He is oriented to person, place, and time. He appears well-developed and well-nourished. No distress.  HENT:  Mouth/Throat: Oropharynx is clear and moist. No oropharyngeal exudate.  Cardiovascular: Normal rate, regular rhythm and normal heart sounds. Exam reveals no gallop and no friction rub.  No murmur heard.  Pulmonary/Chest: Effort normal and breath sounds normal. No respiratory distress. Right base wheezes.  Abdominal: Soft. Bowel sounds are normal. He exhibits no distension. There is no tenderness.  Lymphadenopathy:  He has no cervical adenopathy.  Neurological: He is alert and oriented to person, place, and time.  Skin: Skin is warm and dry. No rash noted.  No erythema. Serous drainage from small perirectal lesion Psychiatric: He has a normal mood and affect. His behavior is  normal.    Lab Results  Component Value Date   CD4TCELL 29 (L) 01/09/2020   Lab Results  Component Value Date   CD4TABS 799 01/09/2020   CD4TABS 597 07/29/2019   CD4TABS 736 04/09/2019   Lab Results  Component Value Date   HIV1RNAQUANT <20 01/09/2020   Lab Results  Component Value Date   HEPBSAB YES 06/12/2006   Lab Results  Component Value Date   LABRPR NON-REACTIVE 07/29/2019    CBC Lab Results  Component Value Date   WBC 6.2 01/09/2020   RBC 3.79 (L) 01/09/2020   HGB 12.1 (L) 01/09/2020   HCT 35.5 (L) 01/09/2020   PLT 351 01/09/2020   MCV 93.7 01/09/2020   MCH 31.9 01/09/2020   MCHC 34.1 01/09/2020   RDW 13.1 01/09/2020   LYMPHSABS 2,809 01/09/2020   MONOABS 0.9 01/26/2017   EOSABS 279 01/09/2020    BMET Lab Results  Component Value Date   NA 142 01/09/2020   K 4.1 01/09/2020   CL 108 01/09/2020   CO2 28 01/09/2020   GLUCOSE 91 01/09/2020   BUN 13 01/09/2020   CREATININE 1.22 01/09/2020   CALCIUM 9.7 01/09/2020   GFRNONAA 57 (L) 07/29/2019   GFRAA 66 07/29/2019      Assessment and Plan  Cold like symptoms = concern that he still may have covid given his presentation. 6 days of symptoms for uri. Improving Will Check covid-19- to see if  monoclonal candidate?  hiv disease = continue on symtuza, is well controlled  Long term medication monitoring = cr is stable

## 2020-01-22 LAB — SARS-COV-2 RNA,(COVID-19) QUALITATIVE NAAT: SARS CoV2 RNA: NOT DETECTED

## 2020-04-21 ENCOUNTER — Ambulatory Visit: Payer: Self-pay

## 2020-04-21 ENCOUNTER — Other Ambulatory Visit: Payer: Self-pay

## 2020-04-21 DIAGNOSIS — Z79899 Other long term (current) drug therapy: Secondary | ICD-10-CM

## 2020-04-21 DIAGNOSIS — Z113 Encounter for screening for infections with a predominantly sexual mode of transmission: Secondary | ICD-10-CM

## 2020-04-21 DIAGNOSIS — B20 Human immunodeficiency virus [HIV] disease: Secondary | ICD-10-CM

## 2020-05-18 ENCOUNTER — Encounter: Payer: Self-pay | Admitting: Internal Medicine

## 2020-05-18 ENCOUNTER — Ambulatory Visit (INDEPENDENT_AMBULATORY_CARE_PROVIDER_SITE_OTHER): Payer: Self-pay | Admitting: Internal Medicine

## 2020-05-18 ENCOUNTER — Other Ambulatory Visit: Payer: Self-pay

## 2020-05-18 VITALS — BP 179/97 | HR 83 | Temp 97.6°F | Ht 71.0 in | Wt 174.0 lb

## 2020-05-18 DIAGNOSIS — B20 Human immunodeficiency virus [HIV] disease: Secondary | ICD-10-CM

## 2020-05-18 DIAGNOSIS — Z79899 Other long term (current) drug therapy: Secondary | ICD-10-CM

## 2020-05-18 DIAGNOSIS — Z8619 Personal history of other infectious and parasitic diseases: Secondary | ICD-10-CM

## 2020-05-18 DIAGNOSIS — I10 Essential (primary) hypertension: Secondary | ICD-10-CM

## 2020-05-18 MED ORDER — HYDROCHLOROTHIAZIDE 25 MG PO TABS: 25.0000 mg | ORAL_TABLET | Freq: Every day | ORAL | 11 refills | Status: DC

## 2020-05-18 MED ORDER — AMLODIPINE BESYLATE 10 MG PO TABS: 10.0000 mg | ORAL_TABLET | Freq: Every day | ORAL | 11 refills | Status: DC

## 2020-05-18 NOTE — Progress Notes (Signed)
RFV: follow up for hiv disease  Patient ID: [name], male   DOB: Jun 04, 1955, 65 y.o.   MRN: [id number]  HPI 65yo M with HIV disease, HTN, currently on symtuza, doing well overall. He reports that he has  not taking BP meds today or yesterday. However,  not missing doses. He has not had booster or covid infection but Has know many people loss lives to covid. He continues to wear masks. He is doing well from his overall health perspective. Not requiring UC or ED visits of late. Outpatient Encounter Medications as of 05/18/2020  Medication Sig  . amLODipine (NORVASC) 10 MG tablet TAKE 1 TABLET(10 MG) BY MOUTH DAILY  . Darunavir-Cobicisctat-Emtricitabine-Tenofovir Alafenamide (SYMTUZA) 800-150-200-10 MG TABS Take 1 tablet by mouth daily with breakfast.  . gabapentin (NEURONTIN) 300 MG capsule TAKE 1 CAPSULE(300 MG) BY MOUTH TWICE DAILY  . hydrochlorothiazide (HYDRODIURIL) 25 MG tablet TAKE 1 TABLET(25 MG) BY MOUTH DAILY  . imiquimod (ALDARA) 5 % cream Apply topically 3 (three) times a week. Apply at bedtime on affected area and wash off in the morning (Patient not taking: Reported on 05/18/2020)  . traMADol (ULTRAM) 50 MG tablet Take 1 tablet (50 mg total) by mouth every 6 (six) hours as needed for moderate pain. (Patient not taking: Reported on 05/18/2020)   No facility-administered encounter medications on file as of 05/18/2020.     Patient Active Problem List   Diagnosis Date Noted  . Perirectal abscess 01/26/2017  . Diarrhea 01/12/2017  . Genital herpes 03/12/2014  . Penile wart 03/12/2014  . Urethritis, nongonococcal 03/06/2013  . HYPERLIPIDEMIA 05/02/2007  . WEIGHT LOSS 08/30/2006  . HIV (human immunodeficiency virus infection) (HCC) 05/30/2006  . Essential hypertension 05/30/2006  . DENTAL CARIES 05/30/2006  . METHICILLIN RESISTANT STAPHYLOCOCCUS AUREUS INFECTION 10/31/2004     Health Maintenance Due  Topic Date Due  . COLONOSCOPY (Pts 45-62yrs Insurance coverage will  need to be confirmed)  Never done  . COVID-19 Vaccine (3 - Moderna risk 4-dose series) 10/01/2019     Review of Systems  Constitutional: Negative for fever, chills, diaphoresis, activity change, appetite change, fatigue and unexpected weight change.  HENT: Negative for congestion, sore throat, rhinorrhea, sneezing, trouble swallowing and sinus pressure.  Eyes: Negative for photophobia and visual disturbance.  Respiratory: Negative for cough, chest tightness, shortness of breath, wheezing and stridor.  Cardiovascular: Negative for chest pain, palpitations and leg swelling.  Gastrointestinal: Negative for nausea, vomiting, abdominal pain, diarrhea, constipation, blood in stool, abdominal distention and anal bleeding.  Genitourinary: Negative for dysuria, hematuria, flank pain and difficulty urinating.  Musculoskeletal: Negative for myalgias, back pain, joint swelling, arthralgias and gait problem.  Skin: +draining cutaneous sinus tract Neurological: Negative for dizziness, tremors, weakness and light-headedness.  Hematological: Negative for adenopathy. Does not bruise/bleed easily.  Psychiatric/Behavioral: Negative for behavioral problems, confusion, sleep disturbance, dysphoric mood, decreased concentration and agitation.    Physical Exam   BP (!) 179/97   Pulse 83   Temp 97.6 F (36.4 C) (Oral)   Ht 5\' 11"  (1.803 m)   Wt 174 lb (78.9 kg)   SpO2 99%   BMI 24.27 kg/m   Physical Exam  Constitutional: He is oriented to person, place, and time. He appears well-developed and well-nourished. No distress.  HENT:  Mouth/Throat: Oropharynx is clear and moist. No oropharyngeal exudate.  Cardiovascular: Normal rate, regular rhythm and normal heart sounds. Exam reveals no gallop and no friction rub.  No murmur heard.  Pulmonary/Chest: Effort normal and breath  sounds normal. No respiratory distress. He has no wheezes.  Lymphadenopathy:  He has no cervical adenopathy.  Neurological: He is  alert and oriented to person, place, and time.  Skin: Skin is warm and dry. No rash noted. No erythema.  Psychiatric: He has a normal mood and affect. His behavior is normal.    Lab Results  Component Value Date   CD4TCELL 29 (L) 01/09/2020   Lab Results  Component Value Date   CD4TABS 799 01/09/2020   CD4TABS 597 07/29/2019   CD4TABS 736 04/09/2019   Lab Results  Component Value Date   HIV1RNAQUANT <20 01/09/2020   Lab Results  Component Value Date   HEPBSAB YES 06/12/2006   Lab Results  Component Value Date   LABRPR NON-REACTIVE 07/29/2019    CBC Lab Results  Component Value Date   WBC 6.2 01/09/2020   RBC 3.79 (L) 01/09/2020   HGB 12.1 (L) 01/09/2020   HCT 35.5 (L) 01/09/2020   PLT 351 01/09/2020   MCV 93.7 01/09/2020   MCH 31.9 01/09/2020   MCHC 34.1 01/09/2020   RDW 13.1 01/09/2020   LYMPHSABS 2,809 01/09/2020   MONOABS 0.9 01/26/2017   EOSABS 279 01/09/2020    BMET Lab Results  Component Value Date   NA 142 01/09/2020   K 4.1 01/09/2020   CL 108 01/09/2020   CO2 28 01/09/2020   GLUCOSE 91 01/09/2020   BUN 13 01/09/2020   CREATININE 1.22 01/09/2020   CALCIUM 9.7 01/09/2020   GFRNONAA 57 (L) 07/29/2019   GFRAA 66 07/29/2019     Assessment and Plan  hiv disease = continue with taking current regimen. Will give refills, will check labs  Long term medication management = will check cr to see if stable  Hypertension = poorly controlled. Needs to take meds. Family hx of heart disease. Also will check lipid profile to see if need to be on a statin  Health maintenance = needs covid booster as well as  needs primary care doctor  Addendum : RPR is positive = will give 1 dose of IM PCN

## 2020-05-19 LAB — URINALYSIS, ROUTINE W REFLEX MICROSCOPIC
Bacteria, UA: NONE SEEN /HPF
Bilirubin Urine: NEGATIVE
Glucose, UA: NEGATIVE
Hyaline Cast: NONE SEEN /LPF
Ketones, ur: NEGATIVE
Nitrite: NEGATIVE
Protein, ur: NEGATIVE
Specific Gravity, Urine: 1.014 (ref 1.001–1.03)
Squamous Epithelial / HPF: NONE SEEN /HPF (ref <=5)
pH: 6.5 (ref 5.0–8.0)

## 2020-05-19 LAB — URINE CYTOLOGY ANCILLARY ONLY
Chlamydia: POSITIVE — AB
Comment: NEGATIVE
Comment: NORMAL
Neisseria Gonorrhea: NEGATIVE

## 2020-05-19 LAB — MICROALBUMIN / CREATININE URINE RATIO
Creatinine, Urine: 152 mg/dL (ref 20–320)
Microalb Creat Ratio: 11 mcg/mg creat (ref <30)
Microalb, Ur: 1.6 mg/dL

## 2020-05-19 LAB — T-HELPER CELL (CD4) - (RCID CLINIC ONLY)
CD4 % Helper T Cell: 20 % — ABNORMAL LOW (ref 33–65)
CD4 T Cell Abs: 567 /uL (ref 400–1790)

## 2020-05-21 ENCOUNTER — Telehealth: Payer: Self-pay

## 2020-05-21 LAB — LIPID PANEL
Cholesterol: 232 mg/dL — ABNORMAL HIGH (ref <200)
HDL: 45 mg/dL (ref >=40)
LDL Cholesterol (Calc): 157 mg/dL (calc) — ABNORMAL HIGH
Non-HDL Cholesterol (Calc): 187 mg/dL (calc) — ABNORMAL HIGH (ref <130)
Total CHOL/HDL Ratio: 5.2 (calc) — ABNORMAL HIGH (ref <5.0)
Triglycerides: 165 mg/dL — ABNORMAL HIGH (ref <150)

## 2020-05-21 LAB — CBC WITH DIFFERENTIAL/PLATELET
Absolute Monocytes: 599 cells/uL (ref 200–950)
Basophils Absolute: 52 cells/uL (ref 0–200)
Basophils Relative: 0.7 %
Eosinophils Absolute: 59 cells/uL (ref 15–500)
Eosinophils Relative: 0.8 %
HCT: 38.5 % (ref 38.5–50.0)
Hemoglobin: 13 g/dL — ABNORMAL LOW (ref 13.2–17.1)
Lymphs Abs: 3115 cells/uL (ref 850–3900)
MCH: 30.9 pg (ref 27.0–33.0)
MCHC: 33.8 g/dL (ref 32.0–36.0)
MCV: 91.4 fL (ref 80.0–100.0)
MPV: 9.5 fL (ref 7.5–12.5)
Monocytes Relative: 8.1 %
Neutro Abs: 3574 cells/uL (ref 1500–7800)
Neutrophils Relative %: 48.3 %
Platelets: 375 10*3/uL (ref 140–400)
RBC: 4.21 10*6/uL (ref 4.20–5.80)
RDW: 13.2 % (ref 11.0–15.0)
Total Lymphocyte: 42.1 %
WBC: 7.4 10*3/uL (ref 3.8–10.8)

## 2020-05-21 LAB — COMPLETE METABOLIC PANEL WITH GFR
AG Ratio: 1.1 (calc) (ref 1.0–2.5)
ALT: 14 U/L (ref 9–46)
AST: 18 U/L (ref 10–35)
Albumin: 4.2 g/dL (ref 3.6–5.1)
Alkaline phosphatase (APISO): 97 U/L (ref 35–144)
BUN: 13 mg/dL (ref 7–25)
CO2: 28 mmol/L (ref 20–32)
Calcium: 9.7 mg/dL (ref 8.6–10.3)
Chloride: 105 mmol/L (ref 98–110)
Creat: 1.1 mg/dL (ref 0.70–1.25)
GFR, Est African American: 82 mL/min/{1.73_m2} (ref >=60)
GFR, Est Non African American: 71 mL/min/{1.73_m2} (ref >=60)
Globulin: 3.9 g/dL (calc) — ABNORMAL HIGH (ref 1.9–3.7)
Glucose, Bld: 94 mg/dL (ref 65–99)
Potassium: 4.3 mmol/L (ref 3.5–5.3)
Sodium: 139 mmol/L (ref 135–146)
Total Bilirubin: 0.5 mg/dL (ref 0.2–1.2)
Total Protein: 8.1 g/dL (ref 6.1–8.1)

## 2020-05-21 LAB — FLUORESCENT TREPONEMAL AB(FTA)-IGG-BLD: Fluorescent Treponemal ABS: REACTIVE — AB

## 2020-05-21 LAB — HIV-1 RNA QUANT-NO REFLEX-BLD
HIV 1 RNA Quant: <20 Copies/mL — ABNORMAL HIGH
HIV-1 RNA Quant, Log: <1.3 Log cps/mL — ABNORMAL HIGH

## 2020-05-21 LAB — RPR: RPR Ser Ql: REACTIVE — AB

## 2020-05-21 LAB — RPR TITER: RPR Titer: 1:1 {titer} — ABNORMAL HIGH

## 2020-05-21 NOTE — Telephone Encounter (Signed)
Received notification from [facility] about a reactive RPR and titer of 1:1 with previous non-reactive results. Per Dr. Drue Second, would like patient to come back to the office for a re-draw to confim. Left HIPAA compliant vm requesting call back. Forwarding to triage pool for follow up.  Amaiah Cristiano Loyola Mast, RN

## 2020-05-22 ENCOUNTER — Other Ambulatory Visit: Payer: Self-pay

## 2020-05-22 ENCOUNTER — Other Ambulatory Visit: Payer: Self-pay | Admitting: Internal Medicine

## 2020-05-22 DIAGNOSIS — B20 Human immunodeficiency virus [HIV] disease: Secondary | ICD-10-CM

## 2020-05-22 DIAGNOSIS — Z79899 Other long term (current) drug therapy: Secondary | ICD-10-CM

## 2020-05-22 DIAGNOSIS — Z113 Encounter for screening for infections with a predominantly sexual mode of transmission: Secondary | ICD-10-CM

## 2020-05-22 NOTE — Telephone Encounter (Signed)
Patient returned call and accepts lab appointment today for repeat RPR per Dr. Drue Second.

## 2020-05-25 LAB — RPR: RPR Ser Ql: NONREACTIVE

## 2020-05-27 ENCOUNTER — Telehealth: Payer: Self-pay | Admitting: *Deleted

## 2020-05-27 DIAGNOSIS — A749 Chlamydial infection, unspecified: Secondary | ICD-10-CM

## 2020-05-27 NOTE — Telephone Encounter (Signed)
Urine positive for chlamydia 05/18/20. Please advise. Andree Coss, RN

## 2020-05-29 NOTE — Telephone Encounter (Signed)
Please treat with azithromycin plus make sure his partner gets treated.

## 2020-06-01 MED ORDER — AZITHROMYCIN 250 MG PO TABS: 1000.0000 mg | ORAL_TABLET | Freq: Once | ORAL | 0 refills | Status: AC

## 2020-06-01 NOTE — Addendum Note (Signed)
Addended by: Andree Coss on: 06/01/2020 09:34 AM   Modules accepted: Orders

## 2020-06-01 NOTE — Telephone Encounter (Signed)
Notified patient. He will pick up medication today, will let his partner know to get treated today. Andree Coss, RN

## 2020-06-09 ENCOUNTER — Encounter: Payer: Self-pay | Admitting: Internal Medicine

## 2020-07-27 ENCOUNTER — Other Ambulatory Visit: Payer: Self-pay

## 2020-07-30 ENCOUNTER — Other Ambulatory Visit: Payer: Self-pay

## 2020-07-30 DIAGNOSIS — Z113 Encounter for screening for infections with a predominantly sexual mode of transmission: Secondary | ICD-10-CM

## 2020-07-30 DIAGNOSIS — B20 Human immunodeficiency virus [HIV] disease: Secondary | ICD-10-CM

## 2020-07-30 DIAGNOSIS — Z79899 Other long term (current) drug therapy: Secondary | ICD-10-CM

## 2020-07-31 LAB — T-HELPER CELL (CD4) - (RCID CLINIC ONLY)
CD4 % Helper T Cell: 28 % — ABNORMAL LOW (ref 33–65)
CD4 T Cell Abs: 728 /uL (ref 400–1790)

## 2020-08-02 LAB — CBC WITH DIFFERENTIAL/PLATELET
Absolute Monocytes: 462 cells/uL (ref 200–950)
Basophils Absolute: 60 cells/uL (ref 0–200)
Basophils Relative: 1 %
Eosinophils Absolute: 90 cells/uL (ref 15–500)
Eosinophils Relative: 1.5 %
HCT: 37.3 % — ABNORMAL LOW (ref 38.5–50.0)
Hemoglobin: 12.5 g/dL — ABNORMAL LOW (ref 13.2–17.1)
Lymphs Abs: 2694 cells/uL (ref 850–3900)
MCH: 31.1 pg (ref 27.0–33.0)
MCHC: 33.5 g/dL (ref 32.0–36.0)
MCV: 92.8 fL (ref 80.0–100.0)
MPV: 9.4 fL (ref 7.5–12.5)
Monocytes Relative: 7.7 %
Neutro Abs: 2694 cells/uL (ref 1500–7800)
Neutrophils Relative %: 44.9 %
Platelets: 358 10*3/uL (ref 140–400)
RBC: 4.02 10*6/uL — ABNORMAL LOW (ref 4.20–5.80)
RDW: 12.9 % (ref 11.0–15.0)
Total Lymphocyte: 44.9 %
WBC: 6 10*3/uL (ref 3.8–10.8)

## 2020-08-02 LAB — COMPLETE METABOLIC PANEL WITH GFR
AG Ratio: 1.2 (calc) (ref 1.0–2.5)
ALT: 12 U/L (ref 9–46)
AST: 13 U/L (ref 10–35)
Albumin: 4.2 g/dL (ref 3.6–5.1)
Alkaline phosphatase (APISO): 92 U/L (ref 35–144)
BUN/Creatinine Ratio: 12 (calc) (ref 6–22)
BUN: 18 mg/dL (ref 7–25)
CO2: 28 mmol/L (ref 20–32)
Calcium: 9.9 mg/dL (ref 8.6–10.3)
Chloride: 106 mmol/L (ref 98–110)
Creat: 1.47 mg/dL — ABNORMAL HIGH (ref 0.70–1.25)
GFR, Est African American: 58 mL/min/{1.73_m2} — ABNORMAL LOW (ref >=60)
GFR, Est Non African American: 50 mL/min/{1.73_m2} — ABNORMAL LOW (ref >=60)
Globulin: 3.4 g/dL (calc) (ref 1.9–3.7)
Glucose, Bld: 91 mg/dL (ref 65–99)
Potassium: 4.2 mmol/L (ref 3.5–5.3)
Sodium: 140 mmol/L (ref 135–146)
Total Bilirubin: 0.3 mg/dL (ref 0.2–1.2)
Total Protein: 7.6 g/dL (ref 6.1–8.1)

## 2020-08-02 LAB — LIPID PANEL
Cholesterol: 232 mg/dL — ABNORMAL HIGH (ref <200)
HDL: 46 mg/dL (ref >=40)
LDL Cholesterol (Calc): 150 mg/dL (calc) — ABNORMAL HIGH
Non-HDL Cholesterol (Calc): 186 mg/dL (calc) — ABNORMAL HIGH (ref <130)
Total CHOL/HDL Ratio: 5 (calc) — ABNORMAL HIGH (ref <5.0)
Triglycerides: 214 mg/dL — ABNORMAL HIGH (ref <150)

## 2020-08-02 LAB — HIV-1 RNA QUANT-NO REFLEX-BLD
HIV 1 RNA Quant: NOT DETECTED Copies/mL
HIV-1 RNA Quant, Log: NOT DETECTED Log cps/mL

## 2020-08-02 LAB — RPR: RPR Ser Ql: NONREACTIVE

## 2020-08-17 ENCOUNTER — Encounter: Payer: Self-pay | Admitting: Internal Medicine

## 2020-08-17 ENCOUNTER — Other Ambulatory Visit: Payer: Self-pay

## 2020-08-17 ENCOUNTER — Ambulatory Visit: Payer: Self-pay | Admitting: Internal Medicine

## 2020-08-17 VITALS — BP 150/92 | HR 77 | Resp 16 | Ht 73.0 in | Wt 173.0 lb

## 2020-08-17 DIAGNOSIS — N183 Chronic kidney disease, stage 3 unspecified: Secondary | ICD-10-CM

## 2020-08-17 DIAGNOSIS — E782 Mixed hyperlipidemia: Secondary | ICD-10-CM

## 2020-08-17 DIAGNOSIS — B20 Human immunodeficiency virus [HIV] disease: Secondary | ICD-10-CM

## 2020-08-17 DIAGNOSIS — I10 Essential (primary) hypertension: Secondary | ICD-10-CM

## 2020-08-17 MED ORDER — ROSUVASTATIN CALCIUM 10 MG PO TABS: 10.0000 mg | ORAL_TABLET | Freq: Every day | ORAL | 11 refills | Status: DC

## 2020-08-17 NOTE — Progress Notes (Signed)
RFV: follow up for hiv disease  Patient ID: [name], male   DOB: Aug 07, 1955, 65 y.o.   MRN: [id number]  HPI [name] is a 65yo M with well controlled hiv disease on symtuza. Not missing any doses. He also has had booster for moderna ( got it the last the time). He reports to be in good health. Not having any difficulties at this time.  Outpatient Encounter Medications as of 08/17/2020  Medication Sig  . amLODipine (NORVASC) 10 MG tablet Take 1 tablet (10 mg total) by mouth daily.  . Darunavir-Cobicisctat-Emtricitabine-Tenofovir Alafenamide (SYMTUZA) 800-150-200-10 MG TABS Take 1 tablet by mouth daily with breakfast.  . gabapentin (NEURONTIN) 300 MG capsule TAKE 1 CAPSULE(300 MG) BY MOUTH TWICE DAILY  . hydrochlorothiazide (HYDRODIURIL) 25 MG tablet Take 1 tablet (25 mg total) by mouth daily.   No facility-administered encounter medications on file as of 08/17/2020.     Patient Active Problem List   Diagnosis Date Noted  . 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 Perirectal abscess 01/26/2017  . Diarrhea 01/12/2017  . Genital herpes 03/12/2014  . Penile wart 03/12/2014  . Urethritis, nongonococcal 03/06/2013  . HYPERLIPIDEMIA 05/02/2007  . WEIGHT LOSS 08/30/2006  . HIV (human immunodeficiency virus infection) (HCC) 05/30/2006  . Essential hypertension 05/30/2006  . DENTAL CARIES 05/30/2006  . METHICILLIN RESISTANT STAPHYLOCOCCUS AUREUS INFECTION 10/31/2004     Health Maintenance Due  Topic Date Due  . COLONOSCOPY (Pts 45-24yrs Insurance coverage will need to be confirmed)  Never done  . COVID-19 Vaccine (3 - Moderna risk 4-dose series) 10/01/2019     Review of Systems Review of Systems  Constitutional: Negative for fever, chills, diaphoresis, activity change, appetite change, fatigue and unexpected weight change.  HENT: Negative for congestion, sore throat, rhinorrhea, sneezing, trouble swallowing and sinus pressure.  Eyes: Negative for photophobia and visual disturbance.   Respiratory: Negative for cough, chest tightness, shortness of breath, wheezing and stridor.  Cardiovascular: Negative for chest pain, palpitations and leg swelling.  Gastrointestinal: Negative for nausea, vomiting, abdominal pain, diarrhea, constipation, blood in stool, abdominal distention and anal bleeding.  Genitourinary: Negative for dysuria, hematuria, flank pain and difficulty urinating.  Musculoskeletal: Negative for myalgias, back pain, joint swelling, arthralgias and gait problem.  Skin: Negative for color change, pallor, rash and wound.  Neurological: Negative for dizziness, tremors, weakness and light-headedness.  Hematological: Negative for adenopathy. Does not bruise/bleed easily.  Psychiatric/Behavioral: Negative for behavioral problems, confusion, sleep disturbance, dysphoric mood, decreased concentration and agitation.    Physical Exam   BP (!) 150/92   Pulse 77   Resp 16   Ht 6\' 1"  (1.854 m)   Wt 173 lb (78.5 kg)   SpO2 100%   BMI 22.82 kg/m   Physical Exam  Constitutional: He is oriented to person, place, and time. He appears well-developed and well-nourished. No distress.  HENT:  Mouth/Throat: Oropharynx is clear and moist. No oropharyngeal exudate.  Cardiovascular: Normal rate, regular rhythm and normal heart sounds. Exam reveals no gallop and no friction rub.  No murmur heard.  Pulmonary/Chest: Effort normal and breath sounds normal. No respiratory distress. He has no wheezes.  Abdominal: Soft. Bowel sounds are normal. He exhibits no distension. There is no tenderness.  Lymphadenopathy:  He has no cervical adenopathy.  Neurological: He is alert and oriented to person, place, and time.  Skin: Skin is warm and dry. No rash noted. No erythema.  Psychiatric: He has a normal mood and affect. His behavior is normal.    Lab  Results  Component Value Date   CD4TCELL 28 (L) 07/30/2020   Lab Results  Component Value Date   CD4TABS 728 07/30/2020   CD4TABS 567  05/18/2020   CD4TABS 799 01/09/2020   Lab Results  Component Value Date   HIV1RNAQUANT Not Detected 07/30/2020   Lab Results  Component Value Date   HEPBSAB YES 06/12/2006   Lab Results  Component Value Date   LABRPR NON-REACTIVE 07/30/2020    CBC Lab Results  Component Value Date   WBC 6.0 07/30/2020   RBC 4.02 (L) 07/30/2020   HGB 12.5 (L) 07/30/2020   HCT 37.3 (L) 07/30/2020   PLT 358 07/30/2020   MCV 92.8 07/30/2020   MCH 31.1 07/30/2020   MCHC 33.5 07/30/2020   RDW 12.9 07/30/2020   LYMPHSABS 2,694 07/30/2020   MONOABS 0.9 01/26/2017   EOSABS 90 07/30/2020    BMET Lab Results  Component Value Date   NA 140 07/30/2020   K 4.2 07/30/2020   CL 106 07/30/2020   CO2 28 07/30/2020   GLUCOSE 91 07/30/2020   BUN 18 07/30/2020   CREATININE 1.47 (H) 07/30/2020   CALCIUM 9.9 07/30/2020   GFRNONAA 50 (L) 07/30/2020   GFRAA 58 (L) 07/30/2020      Assessment and Plan  hiv disease = well controlled, continue on current regimen  ckd 3 = remains stable  htn = has been on 2 drug therapy. Start tracking at home. Bring record. May need 3rd agent  hld = will start on rosuvastatin  Booster #2

## 2020-08-19 ENCOUNTER — Telehealth: Payer: Self-pay

## 2020-08-19 MED ORDER — SYMTUZA 800-150-200-10 MG PO TABS: 1.0000 | ORAL_TABLET | Freq: Every day | ORAL | 5 refills | Status: DC

## 2020-08-19 NOTE — Telephone Encounter (Signed)
Received voicemail in triage from [employer]. Patient now has sent up Mail delivery and prefers [facility]. Verbal order given to continue Symtuza with 5 refills. Previous rx with retail [facility] canceled.   Valarie Cones

## 2021-02-10 ENCOUNTER — Other Ambulatory Visit: Payer: Self-pay

## 2021-02-10 DIAGNOSIS — Z79899 Other long term (current) drug therapy: Secondary | ICD-10-CM

## 2021-02-10 DIAGNOSIS — B20 Human immunodeficiency virus [HIV] disease: Secondary | ICD-10-CM

## 2021-02-15 ENCOUNTER — Ambulatory Visit: Payer: Self-pay

## 2021-02-15 ENCOUNTER — Other Ambulatory Visit: Payer: Self-pay

## 2021-02-19 ENCOUNTER — Other Ambulatory Visit: Payer: Self-pay

## 2021-02-19 ENCOUNTER — Other Ambulatory Visit: Payer: Self-pay | Admitting: Internal Medicine

## 2021-02-19 DIAGNOSIS — B20 Human immunodeficiency virus [HIV] disease: Secondary | ICD-10-CM

## 2021-03-01 ENCOUNTER — Encounter: Payer: Self-pay | Admitting: Internal Medicine

## 2021-05-25 ENCOUNTER — Other Ambulatory Visit: Payer: Self-pay

## 2021-05-25 DIAGNOSIS — B20 Human immunodeficiency virus [HIV] disease: Secondary | ICD-10-CM

## 2021-05-26 LAB — T-HELPER CELLS (CD4) COUNT (NOT AT ARMC)
Absolute CD4: 776 cells/uL (ref 490–1740)
CD4 T Helper %: 14 % — ABNORMAL LOW (ref 30–61)
Total lymphocyte count: 5398 cells/uL — ABNORMAL HIGH (ref 850–3900)

## 2021-06-01 ENCOUNTER — Encounter: Payer: Self-pay | Admitting: Internal Medicine

## 2021-06-08 ENCOUNTER — Other Ambulatory Visit: Payer: Self-pay

## 2021-06-08 ENCOUNTER — Ambulatory Visit: Payer: Self-pay | Admitting: Internal Medicine

## 2021-06-08 ENCOUNTER — Encounter: Payer: Self-pay | Admitting: Internal Medicine

## 2021-06-08 VITALS — BP 135/80 | HR 88 | Temp 98.3°F | Ht 72.0 in | Wt 169.0 lb

## 2021-06-08 DIAGNOSIS — B20 Human immunodeficiency virus [HIV] disease: Secondary | ICD-10-CM

## 2021-06-08 DIAGNOSIS — N183 Chronic kidney disease, stage 3 unspecified: Secondary | ICD-10-CM

## 2021-06-08 DIAGNOSIS — L988 Other specified disorders of the skin and subcutaneous tissue: Secondary | ICD-10-CM

## 2021-06-08 MED ORDER — SYMTUZA 800-150-200-10 MG PO TABS: 1.0000 | ORAL_TABLET | Freq: Every day | ORAL | 5 refills | Status: DC

## 2021-06-08 NOTE — Progress Notes (Signed)
RFV: follow up for hiv disease  Patient ID: [name], male   DOB: Aug 07, 1955, 66 y.o.   MRN: [id number]  HPI [name] is a 66yo M with well controlled hiv disease. On symtuza Had a cold around christmas (tested negative for covid) He reports being off of meds for 2 weeks, thought he didn't have refills. Unclear if hiccup with ADAP.  Outpatient Encounter Medications as of 06/08/2021  Medication Sig   amLODipine (NORVASC) 10 MG tablet Take 1 tablet (10 mg total) by mouth daily.   hydrochlorothiazide (HYDRODIURIL) 25 MG tablet Take 1 tablet (25 mg total) by mouth daily.   rosuvastatin (CRESTOR) 10 MG tablet Take 1 tablet (10 mg total) by mouth daily.   Darunavir-Cobicisctat-Emtricitabine-Tenofovir Alafenamide (SYMTUZA) 800-150-200-10 MG TABS Take 1 tablet by mouth daily with breakfast. (Patient not taking: Reported on 06/08/2021)   No facility-administered encounter medications on file as of 06/08/2021.     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  HENT: Negative for congestion, sore throat, rhinorrhea, sneezing,  trouble swallowing and sinus pressure.  Eyes: Negative for photophobia and visual disturbance.  Respiratory: Negative for cough, chest tightness, shortness of breath, wheezing and stridor.  Cardiovascular: Negative for chest pain, palpitations and leg swelling.  Gastrointestinal: Negative for nausea, vomiting, abdominal pain, diarrhea, constipation, blood in stool, abdominal distention and anal bleeding.  Genitourinary: Negative for dysuria, hematuria, flank pain and difficulty urinating.  Musculoskeletal: Negative for myalgias, back pain, joint swelling, arthralgias and gait problem.  Skin: Negative for color change, pallor, rash and wound.  Neurological: Negative for dizziness, tremors, weakness and light-headedness.  Hematological: Negative for adenopathy. Does not bruise/bleed easily.  Psychiatric/Behavioral: Negative for behavioral problems, confusion, sleep disturbance, dysphoric mood, decreased concentration and agitation.   Physical Exam   BP 135/80   Pulse 88   Temp 98.3 F (36.8 C) (Oral)   Ht 6' (1.829 m)   Wt 169 lb (76.7 kg)   SpO2 99%   BMI 22.92 kg/m   Physical Exam  Constitutional: He is oriented to person, place, and time. He appears well-developed and well-nourished. No distress.  HENT:  Mouth/Throat: Oropharynx is clear and moist. No oropharyngeal exudate.  Cardiovascular: Normal rate, regular rhythm and normal heart sounds. Exam reveals no gallop and no friction rub.  No murmur heard.  Pulmonary/Chest: Effort normal and breath sounds normal. No respiratory distress. He has no wheezes.  Abdominal: Soft. Bowel sounds are normal. He exhibits no distension. There is no tenderness.  Lymphadenopathy:  He has no cervical adenopathy.  Neurological: He is alert and oriented to person, place, and time.  Skin: Skin is warm and dry. No rash  noted. No erythema.  Psychiatric: He has a normal mood and affect. His behavior is normal.   Lab Results  Component Value Date    CD4TCELL 14 (L) 05/25/2021   Lab Results  Component Value Date   CD4TABS 728 07/30/2020   CD4TABS 567 05/18/2020   CD4TABS 799 01/09/2020   Lab Results  Component Value Date   HIV1RNAQUANT Not Detected 07/30/2020   Lab Results  Component Value Date   HEPBSAB YES 06/12/2006   Lab Results  Component Value Date   LABRPR NON-REACTIVE 07/30/2020    CBC Lab Results  Component Value Date   WBC 6.0 07/30/2020   RBC 4.02 (L) 07/30/2020   HGB 12.5 (L) 07/30/2020   HCT 37.3 (L) 07/30/2020   PLT 358 07/30/2020   MCV 92.8 07/30/2020   MCH 31.1 07/30/2020   MCHC 33.5 07/30/2020   RDW 12.9 07/30/2020   LYMPHSABS 2,694 07/30/2020   MONOABS 0.9 01/26/2017   EOSABS 90 07/30/2020    BMET Lab Results  Component Value Date   NA 140 07/30/2020   K 4.2 07/30/2020   CL 106 07/30/2020   CO2 28 07/30/2020   GLUCOSE 91 07/30/2020   BUN 18 07/30/2020   CREATININE 1.47 (H) 07/30/2020   CALCIUM 9.9 07/30/2020   GFRNONAA 50 (L) 07/30/2020   GFRAA 58 (L) 07/30/2020      Assessment and Plan  Hiv disease= off of meds for 2 wks now. Have him come back in 4 wk to do labs at that time. Resend rx for symtuza. 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 Trace clear drainage.  Health maintenance = will give pfizer covid bivalent booster  Medicare coverage kicks in at end of may.

## 2021-06-09 ENCOUNTER — Other Ambulatory Visit: Payer: Self-pay | Admitting: Internal Medicine

## 2021-06-09 DIAGNOSIS — I10 Essential (primary) hypertension: Secondary | ICD-10-CM

## 2021-06-09 DIAGNOSIS — K611 Rectal abscess: Secondary | ICD-10-CM

## 2021-06-10 NOTE — Telephone Encounter (Signed)
Please advise 

## 2021-06-11 ENCOUNTER — Other Ambulatory Visit: Payer: Self-pay | Admitting: Pharmacist

## 2021-06-11 DIAGNOSIS — B20 Human immunodeficiency virus [HIV] disease: Secondary | ICD-10-CM

## 2021-06-11 MED ORDER — SYMTUZA 800-150-200-10 MG PO TABS: 1.0000 | ORAL_TABLET | Freq: Every day | ORAL | 0 refills | Status: AC

## 2021-06-11 NOTE — Progress Notes (Signed)
Medication Samples have been provided to the patient.  Drug name: Symtuza        Strength: 800/150/200/10 mg Qty: 30  Tablets (1 bottles) LOT: [id number]   Exp.Date: 7/24  Dosing instructions: Take one tablet by mouth once daily with food  The patient has been instructed regarding the correct time, dose, and frequency of taking this medication, including desired effects and most common side effects.   Margarite Gouge, PharmD, CPP Clinical Pharmacist Practitioner Infectious Diseases Clinical Pharmacist [facility]

## 2021-06-15 ENCOUNTER — Other Ambulatory Visit: Payer: Self-pay

## 2021-07-05 ENCOUNTER — Other Ambulatory Visit: Payer: Self-pay

## 2021-10-18 ENCOUNTER — Ambulatory Visit: Payer: Self-pay

## 2021-10-18 ENCOUNTER — Ambulatory Visit (INDEPENDENT_AMBULATORY_CARE_PROVIDER_SITE_OTHER): Payer: Self-pay | Admitting: Internal Medicine

## 2021-10-18 ENCOUNTER — Other Ambulatory Visit: Payer: Self-pay

## 2021-10-18 ENCOUNTER — Encounter: Payer: Self-pay | Admitting: Internal Medicine

## 2021-10-18 VITALS — BP 160/93 | HR 73 | Temp 98.0°F | Ht 71.0 in | Wt 165.0 lb

## 2021-10-18 DIAGNOSIS — E782 Mixed hyperlipidemia: Secondary | ICD-10-CM

## 2021-10-18 DIAGNOSIS — B20 Human immunodeficiency virus [HIV] disease: Secondary | ICD-10-CM

## 2021-10-18 DIAGNOSIS — L0292 Furuncle, unspecified: Secondary | ICD-10-CM

## 2021-10-18 DIAGNOSIS — I10 Essential (primary) hypertension: Secondary | ICD-10-CM

## 2021-10-18 DIAGNOSIS — Z79899 Other long term (current) drug therapy: Secondary | ICD-10-CM

## 2021-10-18 MED ORDER — DOXYCYCLINE HYCLATE 100 MG PO TABS: 100.0000 mg | ORAL_TABLET | Freq: Two times a day (BID) | ORAL | 0 refills | Status: DC

## 2021-10-18 MED ORDER — SYMTUZA 800-150-200-10 MG PO TABS: 1.0000 | ORAL_TABLET | Freq: Every day | ORAL | 5 refills | Status: DC

## 2021-10-18 MED ORDER — HYDROCHLOROTHIAZIDE 25 MG PO TABS: 25.0000 mg | ORAL_TABLET | Freq: Every day | ORAL | 11 refills | Status: DC

## 2021-10-18 MED ORDER — PITAVASTATIN CALCIUM 4 MG PO TABS: 4.0000 mg | ORAL_TABLET | Freq: Every day | ORAL | 11 refills | Status: DC

## 2021-10-18 MED ORDER — AMLODIPINE BESYLATE 10 MG PO TABS: 10.0000 mg | ORAL_TABLET | Freq: Every day | ORAL | 11 refills | Status: DC

## 2021-10-18 NOTE — Progress Notes (Signed)
RFV: follow up for hiv disease Patient ID: [name], male   DOB: Dec 19, 1955, 66 y.o.   MRN: [id number]  HPI [name] is a 66yo M with hiv dissease, well controlled, currently on symtuza, with good adherence. He reports being in good health except for noticing a painful lesion on scrotum - 2 x3 days Has been off of his bp meds for the past few days.  Still remains with his main male partner.  Outpatient Encounter Medications as of 10/18/2021  Medication Sig   amLODipine (NORVASC) 10 MG tablet TAKE 1 TABLET(10 MG) BY MOUTH DAILY   Darunavir-Cobicistat-Emtricitabine-Tenofovir Alafenamide (SYMTUZA) 800-150-200-10 MG TABS Take 1 tablet by mouth daily with breakfast.   GABAPENTIN PO Take by mouth.   hydrochlorothiazide (HYDRODIURIL) 25 MG tablet TAKE 1 TABLET(25 MG) BY MOUTH DAILY   rosuvastatin (CRESTOR) 10 MG tablet Take 1 tablet (10 mg total) by mouth daily. (Patient not taking: Reported on 10/18/2021)   No facility-administered encounter medications on file as of 10/18/2021.     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   Perirectal abscess 01/26/2017   Diarrhea 01/12/2017   Genital herpes 03/12/2014   Penile wart 03/12/2014   Urethritis, nongonococcal 03/06/2013   Hyperlipidemia 05/02/2007   WEIGHT LOSS 08/30/2006   HIV (human immunodeficiency virus infection) (HCC) 05/30/2006   Essential hypertension 05/30/2006   DENTAL CARIES 05/30/2006   METHICILLIN RESISTANT STAPHYLOCOCCUS AUREUS INFECTION 10/31/2004     Health Maintenance Due  Topic Date Due   Zoster Vaccines- Shingrix (1 of 2) Never done   COLONOSCOPY (Pts 45-37yrs Insurance coverage will need to be confirmed)  Never done   COVID-19 Vaccine (3 - Moderna risk series) 10/01/2019   Pneumonia Vaccine 90+ Years old (3 - PPSV23 if available, else PCV20) 10/24/2021     Review of Systems 12 point ros is negative except what is mentioned above Physical Exam   BP (!) 160/93  Comment: Provider notified, taken twice  Pulse 73   Temp 98 F (36.7 C) (Oral)   Ht 5\' 11"  (1.803 m)   Wt 165 lb (74.8 kg)   SpO2 99%   BMI 23.01 kg/m   Physical Exam  Constitutional: He is oriented to person, place, and time. He appears well-developed and well-nourished. No distress.  HENT:  Mouth/Throat: Oropharynx is clear and moist. No oropharyngeal exudate.  Cardiovascular: Normal rate, regular rhythm and normal heart sounds. Exam reveals no gallop and no friction rub.  No murmur heard.  Pulmonary/Chest: Effort normal and breath sounds normal. No respiratory distress. He has no wheezes.  Abdominal: Soft. Bowel sounds are normal. He exhibits no distension. There is no tenderness.  Gu = furuncle noted on scrotum, no eschar. Neurological: He is alert and oriented to person, place, and time.  Skin: Skin is warm and dry. No rash noted. No erythema.  Psychiatric: He has a normal mood and affect. His behavior is normal.   Lab Results  Component Value Date   CD4TCELL 14 (L) 05/25/2021   Lab Results  Component Value Date   CD4TABS 728 07/30/2020   CD4TABS 567 05/18/2020   CD4TABS 799 01/09/2020   Lab Results  Component Value Date   HIV1RNAQUANT Not Detected 07/30/2020   Lab Results  Component Value Date   HEPBSAB YES 06/12/2006   Lab Results  Component Value Date   LABRPR NON-REACTIVE 07/30/2020    CBC Lab Results  Component Value Date   WBC 6.0 07/30/2020  RBC 4.02 (L) 07/30/2020   HGB 12.5 (L) 07/30/2020   HCT 37.3 (L) 07/30/2020   PLT 358 07/30/2020   MCV 92.8 07/30/2020   MCH 31.1 07/30/2020   MCHC 33.5 07/30/2020   RDW 12.9 07/30/2020   LYMPHSABS 2,694 07/30/2020   MONOABS 0.9 01/26/2017   EOSABS 90 07/30/2020    BMET Lab Results  Component Value Date   NA 140 07/30/2020   K 4.2 07/30/2020   CL 106 07/30/2020   CO2 28 07/30/2020   GLUCOSE 91 07/30/2020   BUN 18 07/30/2020   CREATININE 1.47 (H) 07/30/2020   CALCIUM 9.9 07/30/2020   GFRNONAA 50  (L) 07/30/2020   GFRAA 58 (L) 07/30/2020      Assessment and Plan  Hiv disease Refills on symtuza,   HTN = will prescribe pitavastatin,   Htn = poorly controlled because he ran out of medication. Will refill his meds -amlodipine, and hctz  Labs today to check on cr function  Furuncle = hot compress, and do 5 days of doxy

## 2021-10-20 LAB — URINE CYTOLOGY ANCILLARY ONLY
Chlamydia: NEGATIVE
Comment: NEGATIVE
Comment: NORMAL
Neisseria Gonorrhea: NEGATIVE

## 2021-10-20 LAB — T-HELPER CELLS (CD4) COUNT (NOT AT ARMC)
Absolute CD4: 683 cells/uL (ref 490–1740)
CD4 T Helper %: 28 % — ABNORMAL LOW (ref 30–61)
Total lymphocyte count: 2477 cells/uL (ref 850–3900)

## 2021-10-21 LAB — MICROALBUMIN / CREATININE URINE RATIO
Creatinine, Urine: 174 mg/dL (ref 20–320)
Microalb Creat Ratio: 6 mcg/mg creat (ref <30)
Microalb, Ur: 1 mg/dL

## 2021-10-21 LAB — CBC WITH DIFFERENTIAL/PLATELET
Absolute Monocytes: 452 cells/uL (ref 200–950)
Basophils Absolute: 41 cells/uL (ref 0–200)
Basophils Relative: 0.7 %
Eosinophils Absolute: 249 cells/uL (ref 15–500)
Eosinophils Relative: 4.3 %
HCT: 37.7 % — ABNORMAL LOW (ref 38.5–50.0)
Hemoglobin: 12.8 g/dL — ABNORMAL LOW (ref 13.2–17.1)
Lymphs Abs: 2424 cells/uL (ref 850–3900)
MCH: 31.4 pg (ref 27.0–33.0)
MCHC: 34 g/dL (ref 32.0–36.0)
MCV: 92.6 fL (ref 80.0–100.0)
MPV: 9.5 fL (ref 7.5–12.5)
Monocytes Relative: 7.8 %
Neutro Abs: 2633 cells/uL (ref 1500–7800)
Neutrophils Relative %: 45.4 %
Platelets: 326 10*3/uL (ref 140–400)
RBC: 4.07 10*6/uL — ABNORMAL LOW (ref 4.20–5.80)
RDW: 12.7 % (ref 11.0–15.0)
Total Lymphocyte: 41.8 %
WBC: 5.8 10*3/uL (ref 3.8–10.8)

## 2021-10-21 LAB — COMPLETE METABOLIC PANEL WITH GFR
AG Ratio: 1 (calc) (ref 1.0–2.5)
ALT: 13 U/L (ref 9–46)
AST: 17 U/L (ref 10–35)
Albumin: 3.9 g/dL (ref 3.6–5.1)
Alkaline phosphatase (APISO): 75 U/L (ref 35–144)
BUN: 17 mg/dL (ref 7–25)
CO2: 26 mmol/L (ref 20–32)
Calcium: 9.6 mg/dL (ref 8.6–10.3)
Chloride: 107 mmol/L (ref 98–110)
Creat: 1.33 mg/dL (ref 0.70–1.35)
Globulin: 3.9 g/dL (calc) — ABNORMAL HIGH (ref 1.9–3.7)
Glucose, Bld: 117 mg/dL — ABNORMAL HIGH (ref 65–99)
Potassium: 3.8 mmol/L (ref 3.5–5.3)
Sodium: 139 mmol/L (ref 135–146)
Total Bilirubin: 0.4 mg/dL (ref 0.2–1.2)
Total Protein: 7.8 g/dL (ref 6.1–8.1)
eGFR: 59 mL/min/{1.73_m2} — ABNORMAL LOW (ref >=60)

## 2021-10-21 LAB — LIPID PANEL
Cholesterol: 224 mg/dL — ABNORMAL HIGH (ref <200)
HDL: 53 mg/dL (ref >=40)
LDL Cholesterol (Calc): 148 mg/dL (calc) — ABNORMAL HIGH
Non-HDL Cholesterol (Calc): 171 mg/dL (calc) — ABNORMAL HIGH (ref <130)
Total CHOL/HDL Ratio: 4.2 (calc) (ref <5.0)
Triglycerides: 109 mg/dL (ref <150)

## 2021-10-21 LAB — HIV-1 RNA QUANT-NO REFLEX-BLD
HIV 1 RNA Quant: NOT DETECTED Copies/mL
HIV-1 RNA Quant, Log: NOT DETECTED Log cps/mL

## 2021-10-21 LAB — RPR: RPR Ser Ql: NONREACTIVE

## 2022-04-25 ENCOUNTER — Ambulatory Visit: Payer: Self-pay | Admitting: Internal Medicine

## 2022-04-26 ENCOUNTER — Ambulatory Visit: Payer: Self-pay | Admitting: Internal Medicine

## 2022-05-03 ENCOUNTER — Ambulatory Visit: Payer: Self-pay | Admitting: Internal Medicine

## 2022-05-03 ENCOUNTER — Telehealth: Payer: Self-pay

## 2022-05-03 NOTE — Telephone Encounter (Signed)
Called patient back regarding conversation from this morning. Patient understands that Dr. Baxter Flattery agrees that he should test for covid before she sends anything to pharmacy. Patient will call office back with results. Will update provider once this is done. Leatrice Jewels, RMA

## 2022-05-03 NOTE — Telephone Encounter (Signed)
Patient called office today requesting Dr. Baxter Flattery send something for cold to his pharmacy. States since Sunday he has been experiencing runny nose, congestion, and sweating.  Is taking tylenol and over the counter cold medicine. Has not tested for covid, but will do this today.  Will send message to provider to advise if she can send something to [facility] on [address].  Advised if he does not feel better to go to urgent care for evaluation. Leatrice Jewels, RMA

## 2022-05-16 ENCOUNTER — Ambulatory Visit: Payer: Self-pay | Admitting: Internal Medicine

## 2022-05-23 ENCOUNTER — Other Ambulatory Visit: Payer: Self-pay

## 2022-05-23 ENCOUNTER — Ambulatory Visit (INDEPENDENT_AMBULATORY_CARE_PROVIDER_SITE_OTHER): Payer: Managed Care, Other (non HMO) | Admitting: Internal Medicine

## 2022-05-23 ENCOUNTER — Encounter: Payer: Self-pay | Admitting: Internal Medicine

## 2022-05-23 VITALS — BP 137/84 | HR 68 | Temp 97.4°F | Ht 71.0 in | Wt 173.0 lb

## 2022-05-23 DIAGNOSIS — N183 Chronic kidney disease, stage 3 unspecified: Secondary | ICD-10-CM

## 2022-05-23 DIAGNOSIS — Z79899 Other long term (current) drug therapy: Secondary | ICD-10-CM | POA: Diagnosis not present

## 2022-05-23 DIAGNOSIS — Z23 Encounter for immunization: Secondary | ICD-10-CM | POA: Diagnosis not present

## 2022-05-23 DIAGNOSIS — I129 Hypertensive chronic kidney disease with stage 1 through stage 4 chronic kidney disease, or unspecified chronic kidney disease: Secondary | ICD-10-CM

## 2022-05-23 DIAGNOSIS — B20 Human immunodeficiency virus [HIV] disease: Secondary | ICD-10-CM | POA: Diagnosis not present

## 2022-05-23 DIAGNOSIS — I10 Essential (primary) hypertension: Secondary | ICD-10-CM

## 2022-05-23 MED ORDER — SYMTUZA 800-150-200-10 MG PO TABS: 1.0000 | ORAL_TABLET | Freq: Every day | ORAL | 5 refills | Status: DC

## 2022-05-23 MED ORDER — ROSUVASTATIN CALCIUM 10 MG PO TABS: 10.0000 mg | ORAL_TABLET | Freq: Every day | ORAL | 11 refills | Status: DC

## 2022-05-23 NOTE — Progress Notes (Signed)
Patient ID: [name], male   DOB: 1956-01-07, 67 y.o.   MRN: [id number]  HPI [name] is a 67yo M with HIV disease, CD 4 count of 728/VL undetectable (July 2023)   Had flu like symptoms for 24hrs - but then improved. He did covid ag which was negative x 1.   Did not take pitastatin prescription for reprieve trial at last visit  Outpatient Encounter Medications as of 05/23/2022  Medication Sig   amLODipine (NORVASC) 10 MG tablet Take 1 tablet (10 mg total) by mouth daily.   Darunavir-Cobicistat-Emtricitabine-Tenofovir Alafenamide (SYMTUZA) 800-150-200-10 MG TABS Take 1 tablet by mouth daily with breakfast.   GABAPENTIN PO Take by mouth.   hydrochlorothiazide (HYDRODIURIL) 25 MG tablet Take 1 tablet (25 mg total) by mouth daily.   Pitavastatin Calcium 4 MG TABS Take 1 tablet (4 mg total) by mouth daily.   [DISCONTINUED] doxycycline (VIBRA-TABS) 100 MG tablet Take 1 tablet (100 mg total) by mouth 2 (two) times daily. (Patient not taking: Reported on 05/23/2022)   No facility-administered encounter medications on file as of 05/23/2022.     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   Perirectal abscess 01/26/2017   Diarrhea 01/12/2017   Genital herpes 03/12/2014   Penile wart 03/12/2014   Urethritis, nongonococcal 03/06/2013   Hyperlipidemia 05/02/2007   WEIGHT LOSS 08/30/2006   HIV (human immunodeficiency virus infection) ([place]) 05/30/2006   Essential hypertension 05/30/2006   DENTAL CARIES 05/30/2006   METHICILLIN RESISTANT STAPHYLOCOCCUS AUREUS INFECTION 10/31/2004     Health Maintenance Due  Topic Date Due   Zoster Vaccines- Shingrix (1 of 2) Never done   COLONOSCOPY (Pts 45-76yrs Insurance coverage will need to be confirmed)  Never done   Pneumonia Vaccine 24+ Years old (3 - PPSV23 or PCV20) 10/24/2021     Review of Systems Review of Systems  Constitutional: Negative for fever, chills, diaphoresis, activity change, appetite  change, fatigue and unexpected weight change.  HENT: Negative for congestion, sore throat, rhinorrhea, sneezing, trouble swallowing and sinus pressure.  Eyes: Negative for photophobia and visual disturbance.  Respiratory: Negative for cough, chest tightness, shortness of breath, wheezing and stridor.  Cardiovascular: Negative for chest pain, palpitations and leg swelling.  Gastrointestinal: Negative for nausea, vomiting, abdominal pain, diarrhea, constipation, blood in stool, abdominal distention and anal bleeding.  Genitourinary: Negative for dysuria, hematuria, flank pain and difficulty urinating.  Musculoskeletal: Negative for myalgias, back pain, joint swelling, arthralgias and gait problem.  Skin: Negative for color change, pallor, rash and wound.  Neurological: Negative for dizziness, tremors, weakness and light-headedness.  Hematological: Negative for adenopathy. Does not bruise/bleed easily.  Psychiatric/Behavioral: Negative for behavioral problems, confusion, sleep disturbance, dysphoric mood, decreased concentration and agitation.   Physical Exam   BP (!) 146/92   Pulse 87   Temp (!) 97.4 F (36.3 C) (Temporal)   Ht 5\' 11"  (1.803 m)   Wt 173 lb (78.5 kg)   BMI 24.13 kg/m   Physical Exam  Constitutional: He is oriented to person, place, and time. He appears well-developed and well-nourished. No distress.  HENT:  Mouth/Throat: Oropharynx is clear and moist. No oropharyngeal exudate.  Cardiovascular: Normal rate, regular rhythm and normal heart sounds. Exam reveals no gallop and no friction rub.  No murmur heard.  Pulmonary/Chest: Effort normal and breath sounds normal. No respiratory distress. He has no wheezes.  Abdominal: Soft. Bowel sounds are normal. He exhibits no distension. There is no tenderness.  Lymphadenopathy:  He has no cervical adenopathy.  Neurological: He is alert and oriented to person, place, and time.  Skin: Skin is warm and dry. No rash noted. No  erythema.  Psychiatric: He has a normal mood and affect. His behavior is normal.   Lab Results  Component Value Date   CD4TCELL 28 (L) 10/18/2021   Lab Results  Component Value Date   CD4TABS 728 07/30/2020   CD4TABS 567 05/18/2020   CD4TABS 799 01/09/2020   Lab Results  Component Value Date   HIV1RNAQUANT Not Detected 10/18/2021   Lab Results  Component Value Date   HEPBSAB YES 06/12/2006   Lab Results  Component Value Date   LABRPR NON-REACTIVE 10/18/2021    CBC Lab Results  Component Value Date   WBC 5.8 10/18/2021   RBC 4.07 (L) 10/18/2021   HGB 12.8 (L) 10/18/2021   HCT 37.7 (L) 10/18/2021   PLT 326 10/18/2021   MCV 92.6 10/18/2021   MCH 31.4 10/18/2021   MCHC 34.0 10/18/2021   RDW 12.7 10/18/2021   LYMPHSABS 2,424 10/18/2021   MONOABS 0.9 01/26/2017   EOSABS 249 10/18/2021    BMET Lab Results  Component Value Date   NA 139 10/18/2021   K 3.8 10/18/2021   CL 107 10/18/2021   CO2 26 10/18/2021   GLUCOSE 117 (H) 10/18/2021   BUN 17 10/18/2021   CREATININE 1.33 10/18/2021   CALCIUM 9.6 10/18/2021   GFRNONAA 50 (L) 07/30/2020   GFRAA 58 (L) 07/30/2020      Assessment and Plan  Health insurance status =  Needs financial counselor to help decide with medicare coverage. Suspect it is difficult to understand how to sign up now that he qualifies  HIV disease=  - Lab work for hiv  Htn = repeat blood pressure, continue on amlodipine and hctz; on repeat he is well controlled  hypercholesteremia = will start on rosuvastatin 10mg  po  CKD 3/long term medication management = will check cr to see htat he is still at baseline  Health maintenance = will do anal OIZ:[id number] vaccine plus fu vaccine today

## 2022-05-24 LAB — CYTOLOGY, (ORAL, ANAL, URETHRAL) ANCILLARY ONLY
Chlamydia: NEGATIVE
Comment: NEGATIVE
Comment: NORMAL
Neisseria Gonorrhea: NEGATIVE

## 2022-05-24 LAB — T-HELPER CELL (CD4) - (RCID CLINIC ONLY)
CD4 % Helper T Cell: 30 % — ABNORMAL LOW (ref 33–65)
CD4 T Cell Abs: 743 /uL (ref 400–1790)

## 2022-05-25 LAB — LIPID PANEL
Cholesterol: 258 mg/dL — ABNORMAL HIGH (ref <200)
HDL: 49 mg/dL (ref >=40)
LDL Cholesterol (Calc): 182 mg/dL (calc) — ABNORMAL HIGH
Non-HDL Cholesterol (Calc): 209 mg/dL (calc) — ABNORMAL HIGH (ref <130)
Total CHOL/HDL Ratio: 5.3 (calc) — ABNORMAL HIGH (ref <5.0)
Triglycerides: 136 mg/dL (ref <150)

## 2022-05-25 LAB — COMPLETE METABOLIC PANEL WITH GFR
AG Ratio: 1 (calc) (ref 1.0–2.5)
ALT: 16 U/L (ref 9–46)
AST: 20 U/L (ref 10–35)
Albumin: 4.2 g/dL (ref 3.6–5.1)
Alkaline phosphatase (APISO): 88 U/L (ref 35–144)
BUN: 13 mg/dL (ref 7–25)
CO2: 29 mmol/L (ref 20–32)
Calcium: 10.2 mg/dL (ref 8.6–10.3)
Chloride: 105 mmol/L (ref 98–110)
Creat: 1.23 mg/dL (ref 0.70–1.35)
Globulin: 4.1 g/dL (calc) — ABNORMAL HIGH (ref 1.9–3.7)
Glucose, Bld: 94 mg/dL (ref 65–99)
Potassium: 4.4 mmol/L (ref 3.5–5.3)
Sodium: 140 mmol/L (ref 135–146)
Total Bilirubin: 0.6 mg/dL (ref 0.2–1.2)
Total Protein: 8.3 g/dL — ABNORMAL HIGH (ref 6.1–8.1)
eGFR: 65 mL/min/{1.73_m2} (ref >=60)

## 2022-05-25 LAB — CBC WITH DIFFERENTIAL/PLATELET
Absolute Monocytes: 427 cells/uL (ref 200–950)
Basophils Absolute: 70 cells/uL (ref 0–200)
Basophils Relative: 1.3 %
Eosinophils Absolute: 248 cells/uL (ref 15–500)
Eosinophils Relative: 4.6 %
HCT: 39.5 % (ref 38.5–50.0)
Hemoglobin: 13.4 g/dL (ref 13.2–17.1)
Lymphs Abs: 2732 cells/uL (ref 850–3900)
MCH: 31 pg (ref 27.0–33.0)
MCHC: 33.9 g/dL (ref 32.0–36.0)
MCV: 91.4 fL (ref 80.0–100.0)
MPV: 9.3 fL (ref 7.5–12.5)
Monocytes Relative: 7.9 %
Neutro Abs: 1922 cells/uL (ref 1500–7800)
Neutrophils Relative %: 35.6 %
Platelets: 349 10*3/uL (ref 140–400)
RBC: 4.32 10*6/uL (ref 4.20–5.80)
RDW: 12.2 % (ref 11.0–15.0)
Total Lymphocyte: 50.6 %
WBC: 5.4 10*3/uL (ref 3.8–10.8)

## 2022-05-25 LAB — RPR: RPR Ser Ql: NONREACTIVE

## 2022-05-25 LAB — HIV-1 RNA QUANT-NO REFLEX-BLD
HIV 1 RNA Quant: 196 Copies/mL — ABNORMAL HIGH
HIV-1 RNA Quant, Log: 2.29 Log cps/mL — ABNORMAL HIGH

## 2022-05-26 LAB — CYTOLOGY - PAP: Adequacy: ABNORMAL

## 2022-06-21 ENCOUNTER — Emergency Department (HOSPITAL_COMMUNITY): Payer: Managed Care, Other (non HMO)

## 2022-06-21 ENCOUNTER — Emergency Department (HOSPITAL_COMMUNITY)
Admission: EM | Admit: 2022-06-21 | Discharge: 2022-06-21 | Disposition: A | Discharge status: Home / Self Care | Payer: Managed Care, Other (non HMO) | Attending: Emergency Medicine | Admitting: Emergency Medicine

## 2022-06-21 ENCOUNTER — Encounter (HOSPITAL_COMMUNITY): Payer: Self-pay

## 2022-06-21 DIAGNOSIS — Z79899 Other long term (current) drug therapy: Secondary | ICD-10-CM | POA: Insufficient documentation

## 2022-06-21 DIAGNOSIS — R1084 Generalized abdominal pain: Secondary | ICD-10-CM | POA: Diagnosis present

## 2022-06-21 DIAGNOSIS — R112 Nausea with vomiting, unspecified: Secondary | ICD-10-CM | POA: Diagnosis not present

## 2022-06-21 DIAGNOSIS — I10 Essential (primary) hypertension: Secondary | ICD-10-CM | POA: Diagnosis not present

## 2022-06-21 DIAGNOSIS — Z20822 Contact with and (suspected) exposure to covid-19: Secondary | ICD-10-CM | POA: Insufficient documentation

## 2022-06-21 LAB — COMPREHENSIVE METABOLIC PANEL
ALT: 21 U/L (ref 0–44)
AST: 36 U/L (ref 15–41)
Albumin: 4.1 g/dL (ref 3.5–5.0)
Alkaline Phosphatase: 87 U/L (ref 38–126)
Anion gap: 16 — ABNORMAL HIGH (ref 5–15)
BUN: 16 mg/dL (ref 8–23)
CO2: 17 mmol/L — ABNORMAL LOW (ref 22–32)
Calcium: 9.7 mg/dL (ref 8.9–10.3)
Chloride: 107 mmol/L (ref 98–111)
Creatinine, Ser: 1.45 mg/dL — ABNORMAL HIGH (ref 0.61–1.24)
GFR, Estimated: 53 mL/min — ABNORMAL LOW (ref >60)
Glucose, Bld: 183 mg/dL — ABNORMAL HIGH (ref 70–99)
Potassium: 3.6 mmol/L (ref 3.5–5.1)
Sodium: 140 mmol/L (ref 135–145)
Total Bilirubin: 0.7 mg/dL (ref 0.3–1.2)
Total Protein: 8.7 g/dL — ABNORMAL HIGH (ref 6.5–8.1)

## 2022-06-21 LAB — I-STAT CHEM 8, ED
BUN: 16 mg/dL (ref 8–23)
Calcium, Ion: 1.07 mmol/L — ABNORMAL LOW (ref 1.15–1.40)
Chloride: 110 mmol/L (ref 98–111)
Creatinine, Ser: 1.3 mg/dL — ABNORMAL HIGH (ref 0.61–1.24)
Glucose, Bld: 183 mg/dL — ABNORMAL HIGH (ref 70–99)
HCT: 40 % (ref 39.0–52.0)
Hemoglobin: 13.6 g/dL (ref 13.0–17.0)
Potassium: 3.6 mmol/L (ref 3.5–5.1)
Sodium: 143 mmol/L (ref 135–145)
TCO2: 20 mmol/L — ABNORMAL LOW (ref 22–32)

## 2022-06-21 LAB — CBC WITH DIFFERENTIAL/PLATELET
Abs Immature Granulocytes: 0.12 10*3/uL — ABNORMAL HIGH (ref 0.00–0.07)
Basophils Absolute: 0 10*3/uL (ref 0.0–0.1)
Basophils Relative: 0 %
Eosinophils Absolute: 0 10*3/uL (ref 0.0–0.5)
Eosinophils Relative: 0 %
HCT: 38.5 % — ABNORMAL LOW (ref 39.0–52.0)
Hemoglobin: 13.4 g/dL (ref 13.0–17.0)
Immature Granulocytes: 1 %
Lymphocytes Relative: 11 %
Lymphs Abs: 1.4 10*3/uL (ref 0.7–4.0)
MCH: 31.3 pg (ref 26.0–34.0)
MCHC: 34.8 g/dL (ref 30.0–36.0)
MCV: 90 fL (ref 80.0–100.0)
Monocytes Absolute: 0.9 10*3/uL (ref 0.1–1.0)
Monocytes Relative: 7 %
Neutro Abs: 10.8 10*3/uL — ABNORMAL HIGH (ref 1.7–7.7)
Neutrophils Relative %: 81 %
Platelets: 358 10*3/uL (ref 150–400)
RBC: 4.28 MIL/uL (ref 4.22–5.81)
RDW: 12.7 % (ref 11.5–15.5)
WBC: 13.3 10*3/uL — ABNORMAL HIGH (ref 4.0–10.5)
nRBC: 0 % (ref 0.0–0.2)

## 2022-06-21 LAB — RESP PANEL BY RT-PCR (RSV, FLU A&B, COVID)  RVPGX2
Influenza A by PCR: NEGATIVE
Influenza B by PCR: NEGATIVE
Resp Syncytial Virus by PCR: NEGATIVE
SARS Coronavirus 2 by RT PCR: NEGATIVE

## 2022-06-21 LAB — PROTIME-INR
INR: 0.9 (ref 0.8–1.2)
Prothrombin Time: 12.4 seconds (ref 11.4–15.2)

## 2022-06-21 LAB — TYPE AND SCREEN
ABO/RH(D): O POS
Antibody Screen: NEGATIVE

## 2022-06-21 LAB — LACTIC ACID, PLASMA
Lactic Acid, Venous: 4.7 mmol/L (ref 0.5–1.9)
Lactic Acid, Venous: 2.2 mmol/L (ref 0.5–1.9)

## 2022-06-21 LAB — TROPONIN I (HIGH SENSITIVITY)
Troponin I (High Sensitivity): 6 ng/L
Troponin I (High Sensitivity): 10 ng/L

## 2022-06-21 LAB — ABO/RH: ABO/RH(D): O POS

## 2022-06-21 LAB — LIPASE, BLOOD: Lipase: 30 U/L (ref 11–51)

## 2022-06-21 MED ORDER — PANTOPRAZOLE SODIUM 20 MG PO TBEC: 20.0000 mg | DELAYED_RELEASE_TABLET | Freq: Every day | ORAL | 1 refills | Status: AC

## 2022-06-21 MED ORDER — SODIUM CHLORIDE 0.9 % IV BOLUS
1000.0000 mL | Freq: Once | INTRAVENOUS | Status: AC
  Administered 2022-06-21: 1000 mL via INTRAVENOUS

## 2022-06-21 MED ORDER — LABETALOL HCL 5 MG/ML IV SOLN
10.0000 mg | Freq: Once | INTRAVENOUS | Status: AC
  Administered 2022-06-21: 10 mg via INTRAVENOUS
  Filled 2022-06-21: qty 4

## 2022-06-21 MED ORDER — ONDANSETRON 4 MG PO TBDP: ORAL_TABLET | ORAL | 1 refills | Status: AC

## 2022-06-21 MED ORDER — LORAZEPAM 2 MG/ML IJ SOLN
0.5000 mg | Freq: Once | INTRAMUSCULAR | Status: AC
  Administered 2022-06-21: 0.5 mg via INTRAVENOUS
  Filled 2022-06-21: qty 1

## 2022-06-21 MED ORDER — IOHEXOL 350 MG/ML SOLN
100.0000 mL | Freq: Once | INTRAVENOUS | Status: AC | PRN
  Administered 2022-06-21: 100 mL via INTRAVENOUS

## 2022-06-21 MED ORDER — ONDANSETRON HCL 4 MG/2ML IJ SOLN
4.0000 mg | Freq: Once | INTRAMUSCULAR | Status: AC
  Administered 2022-06-21: 4 mg via INTRAVENOUS
  Filled 2022-06-21: qty 2

## 2022-06-21 MED ORDER — MORPHINE SULFATE (PF) 4 MG/ML IV SOLN
4.0000 mg | Freq: Once | INTRAVENOUS | Status: AC
  Administered 2022-06-21: 4 mg via INTRAVENOUS
  Filled 2022-06-21: qty 1

## 2022-06-21 NOTE — ED Triage Notes (Signed)
Pt BIB [facility] for c/o n/v and abdominal pain that started at 1040 this morning. Pt called wife because he was not feeling well, wife got home and noticed he was diaphoretic so she called EMS. Initial bp with EMS was 140 palp, when they sat up they report he lost radials. They witnessed two syncopal episodes. Pt vomited once and has had a lot of dry heaving. Pt is very weak and still clammy. Pt denies chest pain and diarrhea. 4 mg zofran and 100 of fluids PTA. Pt has hx of HTN but has not taken his meds today.   BP 220/120 CBG 210

## 2022-06-21 NOTE — Discharge Instructions (Signed)
Follow-up with either your family doctor or your infectious disease doctor next week for recheck

## 2022-06-21 NOTE — ED Provider Notes (Signed)
[name] Provider Note   CSN: [id number] Arrival date & time: 06/21/22  1352     History {Add pertinent medical, surgical, social history, OB history to HPI:1} No chief complaint on file.   [name] is a 67 y.o. male.  67 year old male with prior medical history as detailed below presents for evaluation.  Patient reports around 1040 this morning he began to feel bad.  He developed acute onset nausea, vomiting, diffuse abdominal pain/lower chest pain.  Patient was diaphoretic with his symptoms.  Patient reportedly had 2 brief syncopal episodes with EMS during their initial transport.  Patient did vomit once in the middle of EMS transport.  Patient denies associated chest pain or shortness of breath.  Patient reports history of hypertension.  He did not take any medications today.  On ED evaluation, patient reports that his nausea is somewhat improved.  He reports intermittent crampy abdominal pain that is diffuse.  The history is provided by the patient and medical records.       Home Medications Prior to Admission medications   Medication Sig Start Date End Date Taking? Authorizing Provider  amLODipine (NORVASC) 10 MG tablet Take 1 tablet (10 mg total) by mouth daily. 10/18/21   Carlyle Basques, MD  Darunavir-Cobicistat-Emtricitabine-Tenofovir Alafenamide [facility])) 800-150-200-10 MG TABS Take 1 tablet by mouth daily with breakfast. 05/23/22   Carlyle Basques, MD  GABAPENTIN PO Take by mouth.    [provider]  hydrochlorothiazide (HYDRODIURIL) 25 MG tablet Take 1 tablet (25 mg total) by mouth daily. 10/18/21   Carlyle Basques, MD  rosuvastatin (CRESTOR) 10 MG tablet Take 1 tablet (10 mg total) by mouth daily. 05/23/22   Carlyle Basques, MD      Allergies    Zosyn [piperacillin sod-tazobactam so]    Review of Systems   Review of Systems  All other systems reviewed and are negative.   Physical Exam Updated Vital  Signs Temp (!) 97.5 F (36.4 C) (Oral)  Physical Exam Vitals and nursing note reviewed.  Constitutional:      General: He is not in acute distress.    Appearance: He is well-developed.     Comments: Alert, appears uncomfortable, oriented x 4, complains of diffuse crampy abdominal pain  HENT:     Head: Normocephalic and atraumatic.  Eyes:     Conjunctiva/sclera: Conjunctivae normal.     Pupils: Pupils are equal, round, and reactive to light.  Cardiovascular:     Rate and Rhythm: Normal rate and regular rhythm.     Heart sounds: Normal heart sounds.  Pulmonary:     Effort: Pulmonary effort is normal. No respiratory distress.     Breath sounds: Normal breath sounds.  Abdominal:     General: There is no distension.     Palpations: Abdomen is soft.     Tenderness: There is no abdominal tenderness.  Musculoskeletal:        General: No deformity. Normal range of motion.     Cervical back: Normal range of motion and neck supple.  Skin:    General: Skin is warm and dry.  Neurological:     General: No focal deficit present.     Mental Status: He is alert and oriented to person, place, and time.     ED Results / Procedures / Treatments   Labs (all labs ordered are listed, but only abnormal results are displayed) Labs Reviewed  RESP PANEL BY RT-PCR (RSV, FLU A&B, COVID)  [id number]  CBC WITH DIFFERENTIAL/PLATELET  LACTIC ACID, PLASMA  LACTIC ACID, PLASMA  COMPREHENSIVE METABOLIC PANEL  LIPASE, BLOOD  PROTIME-INR  I-STAT CHEM 8, ED  TYPE AND SCREEN  TROPONIN I (HIGH SENSITIVITY)    EKG EKG Interpretation  Date/Time:  Tuesday June 21 2022 14:03:30 EST Ventricular Rate:  69 PR Interval:  169 QRS Duration: 105 QT Interval:  453 QTC Calculation: 486 R Axis:   80 Text Interpretation: Sinus rhythm Borderline prolonged QT interval Confirmed by Dene Gentry [id number]) on 06/21/2022 2:08:20 PM  Radiology No results found.  Procedures Procedures  {Document cardiac monitor,  telemetry assessment procedure when appropriate:1}  Medications Ordered in ED Medications  sodium chloride 0.9 % bolus 1,000 mL (has no administration in time range)  morphine (PF) 4 MG/ML injection 4 mg (has no administration in time range)    ED Course/ Medical Decision Making/ A&P   {   Click here for ABCD2, HEART and other calculatorsREFRESH Note before signing :1}                          Medical Decision Making Amount and/or Complexity of Data Reviewed Labs: ordered. Radiology: ordered.  Risk Prescription drug management.    Medical Screen Complete  This patient presented to the ED with complaint of nausea, vomiting, abdominal pain, syncope.  This complaint involves an extensive number of treatment options. The initial differential diagnosis includes, but is not limited to, ACS, aortic emergency, hypertensive emergency, gastroenteritis, metabolic abnormality, etc.  This presentation is: Acute, Self-Limited, Previously Undiagnosed, Uncertain Prognosis, Complicated, Systemic Symptoms, and Threat to Life/Bodily Function  Patient with acute onset chest and abdominal pain.  This was associated with nausea and vomiting.  Patient noted to be hypertensive.  Patient did not take his normal antihypertensives this morning prior to onset of symptoms.    Initial presentation concerning for possible acute aortic syndrome.  CT angio chest abdomen pelvis was without evidence of significant acute pathology.  Patient is feeling improved with Zofran, morphine, Ativan.  Patient's white count is slightly elevated at 13.3.  Initial troponin is 6.  Patient's blood pressure remains elevated despite use of antiemetics and pain medication.  Oncoming EDP ([place]) aware of case and pending workup.  Additional history obtained:  Additional history obtained from [facility] External records from outside sources obtained and reviewed including prior ED visits and prior Inpatient records.    Lab  Tests:  I ordered and personally interpreted labs.  The pertinent results include: CBC, CMP, i-STAT Chem-8, lactic acid, troponin x 2, lipase   Imaging Studies ordered:  I ordered imaging studies including chest x-ray, CT angio chest abdomen pelvis I independently visualized and interpreted obtained imaging which showed NAD I agree with the radiologist interpretation.   Cardiac Monitoring:  The patient was maintained on a cardiac monitor.  I personally viewed and interpreted the cardiac monitor which showed an underlying rhythm of: NSR   Medicines ordered:  I ordered medication including ***  for ***  Reevaluation of the patient after these medicines showed that the patient: {resolved/improved/worsened:23923::"improved"}    Test Considered:  ***   Critical Interventions:  ***   Consultations Obtained:  I consulted ***,  and discussed lab and imaging findings as well as pertinent plan of care.    Problem List / ED Course:  ***   Reevaluation:  After the interventions noted above, I reevaluated the patient and found that they have: {resolved/improved/worsened:23923::"improved"}   Social Determinants of Health:  ***  Disposition:  After consideration of the diagnostic results and the patients response to treatment, I feel that the patent would benefit from ***.    {Document critical care time when appropriate:1} {Document review of labs and clinical decision tools ie heart score, Chads2Vasc2 etc:1}  {Document your independent review of radiology images, and any outside records:1} {Document your discussion with family members, caretakers, and with consultants:1} {Document social determinants of health affecting pt's care:1} {Document your decision making why or why not admission, treatments were needed:1} Final Clinical Impression(s) / ED Diagnoses Final diagnoses:  Generalized abdominal pain  Nausea and vomiting, unspecified vomiting type    Rx /  DC Orders ED Discharge Orders     None

## 2022-10-17 ENCOUNTER — Ambulatory Visit: Payer: Self-pay | Admitting: Internal Medicine

## 2022-10-17 ENCOUNTER — Ambulatory Visit: Payer: Self-pay

## 2022-10-27 ENCOUNTER — Encounter: Payer: Self-pay | Admitting: Internal Medicine

## 2022-10-27 ENCOUNTER — Other Ambulatory Visit: Payer: Self-pay

## 2022-10-27 ENCOUNTER — Ambulatory Visit (INDEPENDENT_AMBULATORY_CARE_PROVIDER_SITE_OTHER): Payer: Managed Care, Other (non HMO) | Admitting: Internal Medicine

## 2022-10-27 VITALS — BP 141/77 | HR 72 | Temp 98.1°F | Wt 173.0 lb

## 2022-10-27 DIAGNOSIS — Z0000 Encounter for general adult medical examination without abnormal findings: Secondary | ICD-10-CM

## 2022-10-27 DIAGNOSIS — B20 Human immunodeficiency virus [HIV] disease: Secondary | ICD-10-CM

## 2022-10-27 DIAGNOSIS — I10 Essential (primary) hypertension: Secondary | ICD-10-CM | POA: Diagnosis not present

## 2022-10-27 MED ORDER — SYMTUZA 800-150-200-10 MG PO TABS: 1.0000 | ORAL_TABLET | Freq: Every day | ORAL | 5 refills | Status: DC

## 2022-10-27 NOTE — Progress Notes (Signed)
RFV: follow up for hiv disease Patient ID: [name], male   DOB: [birth year], 67 y.o.   MRN: [id number]  HPI 67yo M with hiv disease, and HTN. He reports Just ran out of symtuza, 3 days ago. He reports that he is doing well overall.  Outpatient Encounter Medications as of 10/27/2022  Medication Sig   amLODipine (NORVASC) 10 MG tablet Take 1 tablet (10 mg total) by mouth daily. (Patient taking differently: Take 10 mg by mouth every evening.)   Darunavir-Cobicistat-Emtricitabine-Tenofovir Alafenamide (SYMTUZA) 800-150-200-10 MG TABS Take 1 tablet by mouth daily with breakfast.   hydrochlorothiazide (HYDRODIURIL) 25 MG tablet Take 1 tablet (25 mg total) by mouth daily. (Patient taking differently: Take 25 mg by mouth every evening.)   ondansetron (ZOFRAN-ODT) 4 MG disintegrating tablet 4mg  ODT q4 hours prn nausea/vomit   pantoprazole (PROTONIX) 20 MG tablet Take 1 tablet (20 mg total) by mouth daily.   rosuvastatin (CRESTOR) 10 MG tablet Take 1 tablet (10 mg total) by mouth daily.   No facility-administered encounter medications on file as of 10/27/2022.     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   Perirectal abscess 01/26/2017   Diarrhea 01/12/2017   Genital herpes 03/12/2014   Penile wart 03/12/2014   Urethritis, nongonococcal 03/06/2013   Hyperlipidemia 05/02/2007   WEIGHT LOSS 08/30/2006   HIV (human immunodeficiency virus infection) ([facility]) 05/30/2006   Essential hypertension 05/30/2006   DENTAL CARIES 05/30/2006   METHICILLIN RESISTANT STAPHYLOCOCCUS AUREUS INFECTION 10/31/2004     Health Maintenance Due  Topic Date Due   Zoster Vaccines- Shingrix (1 of 2) Never done   Colonoscopy  Never done   COVID-19 Vaccine (3 - Moderna risk series) 10/01/2019     Review of Systems Review of Systems  Constitutional: Negative for fever, chills, diaphoresis, activity change, appetite change, fatigue and unexpected weight change.  HENT:  Negative for congestion, sore throat, rhinorrhea, sneezing, trouble swallowing and sinus pressure.  Eyes: Negative for photophobia and visual disturbance.  Respiratory: Negative for cough, chest tightness, shortness of breath, wheezing and stridor.  Cardiovascular: Negative for chest pain, palpitations and leg swelling.  Gastrointestinal: Negative for nausea, vomiting, abdominal pain, diarrhea, constipation, blood in stool, abdominal distention and anal bleeding.  Genitourinary: Negative for dysuria, hematuria, flank pain and difficulty urinating.  Musculoskeletal: Negative for myalgias, back pain, joint swelling, arthralgias and gait problem.  Skin: Negative for color change, pallor, rash and wound.  Neurological: Negative for dizziness, tremors, weakness and light-headedness.  Hematological: Negative for adenopathy. Does not bruise/bleed easily.  Psychiatric/Behavioral: Negative for behavioral problems, confusion, sleep disturbance, dysphoric mood, decreased concentration and agitation.   Physical Exam   BP (!) 141/77   Pulse 72   Temp 98.1 F (36.7 C) (Oral)   Wt 173 lb (78.5 kg)   SpO2 98%   BMI 24.13 kg/m   Physical Exam  Constitutional: He is oriented to person, place, and time. He appears well-developed and well-nourished. No distress.  HENT:  Mouth/Throat: Oropharynx is clear and moist. No oropharyngeal exudate.  Cardiovascular: Normal rate, regular rhythm and normal heart sounds. Exam reveals no gallop and no friction rub.  No murmur heard.  Pulmonary/Chest: Effort normal and breath sounds normal. No respiratory distress. He has no wheezes.  Abdominal: Soft. Bowel sounds are normal. He exhibits no distension. There is no tenderness.  Lymphadenopathy:  He has no cervical adenopathy.  Neurological: He is alert and oriented to person, place, and time.  Skin: Skin is warm and dry. No rash noted. No erythema.  Psychiatric: He has a normal mood and affect. His behavior is normal.    Lab Results  Component Value Date   CD4TCELL 30 (L) 05/23/2022   Lab Results  Component Value Date   CD4TABS 743 05/23/2022   CD4TABS 728 07/30/2020   CD4TABS 567 05/18/2020   Lab Results  Component Value Date   HIV1RNAQUANT 196 (H) 05/23/2022   Lab Results  Component Value Date   HEPBSAB YES 06/12/2006   Lab Results  Component Value Date   LABRPR NON-REACTIVE 05/23/2022    CBC Lab Results  Component Value Date   WBC 13.3 (H) 06/21/2022   RBC 4.28 06/21/2022   HGB 13.6 06/21/2022   HCT 40.0 06/21/2022   PLT 358 06/21/2022   MCV 90.0 06/21/2022   MCH 31.3 06/21/2022   MCHC 34.8 06/21/2022   RDW 12.7 06/21/2022   LYMPHSABS 1.4 06/21/2022   MONOABS 0.9 06/21/2022   EOSABS 0.0 06/21/2022    BMET Lab Results  Component Value Date   NA 143 06/21/2022   K 3.6 06/21/2022   CL 110 06/21/2022   CO2 17 (L) 06/21/2022   GLUCOSE 183 (H) 06/21/2022   BUN 16 06/21/2022   CREATININE 1.30 (H) 06/21/2022   CALCIUM 9.7 06/21/2022   GFRNONAA 53 (L) 06/21/2022   GFRAA 58 (L) 07/30/2020      Assessment and Plan  Hypertension = will redo Bp-  Hiv- refills plus do labs (missed last 3 days); low level viremia. Will add tivicay to his regimen.  Long term medicaiton management = will check cr to see if stable  Health maintenance Now has medicare coverage - will refer to get colonoscopy

## 2022-10-28 LAB — T-HELPER CELL (CD4) - (RCID CLINIC ONLY)
CD4 % Helper T Cell: 28 % — ABNORMAL LOW (ref 33–65)
CD4 T Cell Abs: 592 /uL (ref 400–1790)

## 2022-11-01 LAB — CBC WITH DIFFERENTIAL/PLATELET
Absolute Monocytes: 462 cells/uL (ref 200–950)
Basophils Absolute: 48 cells/uL (ref 0–200)
Basophils Relative: 0.8 %
Eosinophils Absolute: 162 cells/uL (ref 15–500)
Eosinophils Relative: 2.7 %
HCT: 32.6 % — ABNORMAL LOW (ref 38.5–50.0)
Hemoglobin: 11 g/dL — ABNORMAL LOW (ref 13.2–17.1)
Lymphs Abs: 2148 cells/uL (ref 850–3900)
MCH: 30.7 pg (ref 27.0–33.0)
MCHC: 33.7 g/dL (ref 32.0–36.0)
MCV: 91.1 fL (ref 80.0–100.0)
MPV: 9.3 fL (ref 7.5–12.5)
Monocytes Relative: 7.7 %
Neutro Abs: 3180 cells/uL (ref 1500–7800)
Neutrophils Relative %: 53 %
Platelets: 368 10*3/uL (ref 140–400)
RBC: 3.58 10*6/uL — ABNORMAL LOW (ref 4.20–5.80)
RDW: 12.8 % (ref 11.0–15.0)
Total Lymphocyte: 35.8 %
WBC: 6 10*3/uL (ref 3.8–10.8)

## 2022-11-01 LAB — COMPLETE METABOLIC PANEL WITH GFR
AG Ratio: 1 (calc) (ref 1.0–2.5)
ALT: 10 U/L (ref 9–46)
AST: 13 U/L (ref 10–35)
Albumin: 3.7 g/dL (ref 3.6–5.1)
Alkaline phosphatase (APISO): 86 U/L (ref 35–144)
BUN: 9 mg/dL (ref 7–25)
CO2: 25 mmol/L (ref 20–32)
Calcium: 9.3 mg/dL (ref 8.6–10.3)
Chloride: 106 mmol/L (ref 98–110)
Creat: 1.2 mg/dL (ref 0.70–1.35)
Globulin: 3.6 g/dL (calc) (ref 1.9–3.7)
Glucose, Bld: 131 mg/dL — ABNORMAL HIGH (ref 65–99)
Potassium: 4.1 mmol/L (ref 3.5–5.3)
Sodium: 137 mmol/L (ref 135–146)
Total Bilirubin: 0.3 mg/dL (ref 0.2–1.2)
Total Protein: 7.3 g/dL (ref 6.1–8.1)
eGFR: 67 mL/min/{1.73_m2} (ref >=60)

## 2022-11-01 LAB — HIV-1 RNA QUANT-NO REFLEX-BLD
HIV 1 RNA Quant: 172 Copies/mL — ABNORMAL HIGH
HIV-1 RNA Quant, Log: 2.24 Log cps/mL — ABNORMAL HIGH

## 2022-11-01 LAB — RPR: RPR Ser Ql: NONREACTIVE

## 2022-11-30 ENCOUNTER — Other Ambulatory Visit (HOSPITAL_COMMUNITY): Payer: Self-pay

## 2022-11-30 ENCOUNTER — Telehealth: Payer: Self-pay

## 2022-11-30 NOTE — Telephone Encounter (Signed)
[facility] Patient Advocate Encounter   Received notification from [facility] that prior authorization for Symtuza is required.   PA submitted on 11/30/22 Key [id number] Status is pending    [facility] will continue to follow.   Clearance Coots, CPhT Specialty Pharmacy Patient [facility] Phone: [phone] Fax:  [phone]

## 2022-12-09 ENCOUNTER — Other Ambulatory Visit (HOSPITAL_COMMUNITY): Payer: Self-pay

## 2022-12-20 ENCOUNTER — Telehealth: Payer: Self-pay

## 2022-12-20 ENCOUNTER — Other Ambulatory Visit (HOSPITAL_COMMUNITY): Payer: Self-pay

## 2022-12-20 NOTE — Telephone Encounter (Signed)
[facility] Patient Advocate Encounter  Prior Authorization for [name] has been approved.    PA# [id number] Effective dates: 12/06/22 through 04/17/2098   [facility] will continue to follow.  Clearance Coots, CPhT Specialty Pharmacy Patient [facility] Phone: [phone] Fax:  [phone]

## 2022-12-30 ENCOUNTER — Other Ambulatory Visit (HOSPITAL_COMMUNITY): Payer: Self-pay

## 2023-02-06 ENCOUNTER — Telehealth: Payer: Self-pay

## 2023-02-06 ENCOUNTER — Ambulatory Visit: Payer: Managed Care, Other (non HMO) | Admitting: Internal Medicine

## 2023-02-06 NOTE — Telephone Encounter (Signed)
Called patient to reschedule missed appointment. No answer, left voice message to contact office.

## 2023-02-14 ENCOUNTER — Encounter: Payer: Self-pay | Admitting: Internal Medicine

## 2023-02-14 ENCOUNTER — Other Ambulatory Visit: Payer: Self-pay

## 2023-02-14 ENCOUNTER — Ambulatory Visit (INDEPENDENT_AMBULATORY_CARE_PROVIDER_SITE_OTHER): Payer: Self-pay | Admitting: Internal Medicine

## 2023-02-14 VITALS — BP 121/77 | HR 102 | Temp 98.5°F | Wt 166.0 lb

## 2023-02-14 DIAGNOSIS — B20 Human immunodeficiency virus [HIV] disease: Secondary | ICD-10-CM

## 2023-02-14 DIAGNOSIS — Z23 Encounter for immunization: Secondary | ICD-10-CM

## 2023-02-14 DIAGNOSIS — I10 Essential (primary) hypertension: Secondary | ICD-10-CM

## 2023-02-14 DIAGNOSIS — F17210 Nicotine dependence, cigarettes, uncomplicated: Secondary | ICD-10-CM

## 2023-02-21 ENCOUNTER — Telehealth: Payer: Self-pay

## 2023-02-21 ENCOUNTER — Other Ambulatory Visit (HOSPITAL_COMMUNITY): Payer: Self-pay

## 2023-02-21 NOTE — Telephone Encounter (Signed)
[facility] Patient Advocate Encounter  Completed and sent [employer] Patient Assistance application for Symtuza for this patient who is uninsured.    Patient assistance phone number for follow up is [phone].  Fax # [phone]  This encounter will be updated until final determination.   Clearance Coots, CPhT Specialty Pharmacy Patient [facility] for Infectious Disease Phone: [phone] Fax:  [phone]

## 2023-02-23 ENCOUNTER — Other Ambulatory Visit: Payer: Self-pay | Admitting: Internal Medicine

## 2023-02-23 DIAGNOSIS — I10 Essential (primary) hypertension: Secondary | ICD-10-CM

## 2023-02-24 ENCOUNTER — Other Ambulatory Visit: Payer: Self-pay | Admitting: Internal Medicine

## 2023-02-24 DIAGNOSIS — I10 Essential (primary) hypertension: Secondary | ICD-10-CM

## 2023-02-24 NOTE — Telephone Encounter (Signed)
Chart records indicate amlodipine has not been dispensed since February. Spoke with Jr, confirms he has been taking it consistently. Refill sent.   Sandie Ano, RN

## 2023-03-30 ENCOUNTER — Ambulatory Visit: Payer: Self-pay | Admitting: Internal Medicine

## 2023-04-05 ENCOUNTER — Ambulatory Visit (INDEPENDENT_AMBULATORY_CARE_PROVIDER_SITE_OTHER): Payer: Self-pay | Admitting: Internal Medicine

## 2023-04-05 ENCOUNTER — Encounter: Payer: Self-pay | Admitting: Internal Medicine

## 2023-04-05 ENCOUNTER — Other Ambulatory Visit: Payer: Self-pay

## 2023-04-05 VITALS — BP 133/80 | HR 88 | Resp 16 | Ht 71.0 in | Wt 169.0 lb

## 2023-04-05 DIAGNOSIS — B20 Human immunodeficiency virus [HIV] disease: Secondary | ICD-10-CM

## 2023-04-05 DIAGNOSIS — Z79899 Other long term (current) drug therapy: Secondary | ICD-10-CM

## 2023-04-05 DIAGNOSIS — Z789 Other specified health status: Secondary | ICD-10-CM

## 2023-04-05 DIAGNOSIS — K1120 Sialoadenitis, unspecified: Secondary | ICD-10-CM

## 2023-04-05 DIAGNOSIS — Z21 Asymptomatic human immunodeficiency virus [HIV] infection status: Secondary | ICD-10-CM

## 2023-04-05 MED ORDER — SYMTUZA 800-150-200-10 MG PO TABS: 1.0000 | ORAL_TABLET | Freq: Every day | ORAL | 5 refills | Status: DC

## 2023-04-05 NOTE — Progress Notes (Signed)
RFV: follow up for hiv disease  Today's visit using AI scribe with patient consent  Patient ID: [name], male   DOB: November 07, 1955, 67 y.o.   MRN: [id number]  HPI The patient, with a known history of HIV, presents with a concern about a previously enlarged area, likely related to his HIV status. He reports that the swelling has significantly reduced since restarting his HIV medication, Symtuza, which he has been taking consistently without missing any doses. He has noticed a residual enlargement, but overall, he feels it has improved.  In addition to his HIV management, the patient reports no other significant health issues. He has been maintaining his weight and ensuring adequate nutrition, supplementing with Ensure when his food intake is not optimal. He has been compliant with his medication regimen and has not introduced any new medications.  The patient has a history of a biopsy performed several years ago, the results of which were not discussed in this consultation. He has also been proactive about his preventative health, having received the flu and COVID vaccines, as well as a pneumonia vaccine. He has not yet received a colonoscopy or shingles vaccine.  The patient has been living with HIV for approximately 25 years and reports doing well overall. He attributes his health status to compliance with medical advice and active involvement in his healthcare.  Outpatient Encounter Medications as of 04/05/2023  Medication Sig   amLODipine (NORVASC) 10 MG tablet TAKE 1 TABLET(10 MG) BY MOUTH DAILY   Darunavir-Cobicistat-Emtricitabine-Tenofovir Alafenamide (SYMTUZA) 800-150-200-10 MG TABS Take 1 tablet by mouth daily with breakfast. (Patient not taking: Reported on 02/14/2023)   hydrochlorothiazide (HYDRODIURIL) 25 MG tablet TAKE 1 TABLET(25 MG) BY MOUTH DAILY   ondansetron (ZOFRAN-ODT) 4 MG disintegrating tablet 4mg  ODT q4 hours prn nausea/vomit   pantoprazole (PROTONIX) 20 MG tablet  Take 1 tablet (20 mg total) by mouth daily.   rosuvastatin (CRESTOR) 10 MG tablet Take 1 tablet (10 mg total) by mouth daily. (Patient not taking: Reported on 02/14/2023)   No facility-administered encounter medications on file as of 04/05/2023.     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   Perirectal abscess 01/26/2017   Diarrhea 01/12/2017   Genital herpes 03/12/2014   Penile wart 03/12/2014   Urethritis, nongonococcal 03/06/2013   Hyperlipidemia 05/02/2007   WEIGHT LOSS 08/30/2006   HIV (human immunodeficiency virus infection) (HCC) 05/30/2006   Essential hypertension 05/30/2006   Dental caries 05/30/2006   METHICILLIN RESISTANT STAPHYLOCOCCUS AUREUS INFECTION 10/31/2004     Health Maintenance Due  Topic Date Due   Zoster Vaccines- Shingrix (1 of 2) Never done   Colonoscopy  Never done     Review of Systems  Physical Exam   BP 133/80   Pulse 88   Resp 16   Ht 5\' 11"  (1.803 m)   Wt 169 lb (76.7 kg)   BMI 23.57 kg/m  Physical Exam  Constitutional: He is oriented to person, place, and time. He appears well-developed and well-nourished. No distress.  HENT:  Mouth/Throat: Oropharynx is clear and moist. No oropharyngeal exudate.  Cardiovascular: Normal rate, regular rhythm and normal heart sounds. Exam reveals no gallop and no friction rub.  No murmur heard.  Pulmonary/Chest: Effort normal and breath sounds normal. No respiratory distress. He has no wheezes.  Abdominal: Soft. Bowel sounds are normal. He exhibits no distension. There is no tenderness.  Lymphadenopathy:  He has no cervical adenopathy.  Neurological: He is alert  and oriented to person, place, and time.  Skin: Skin is warm and dry. No rash noted. No erythema.  Psychiatric: He has a normal mood and affect. His behavior is normal.   Lab Results  Component Value Date   CD4TCELL 28 (L) 10/27/2022   Lab Results  Component Value Date   CD4TABS 592 10/27/2022    CD4TABS 743 05/23/2022   CD4TABS 728 07/30/2020   Lab Results  Component Value Date   HIV1RNAQUANT 172 (H) 10/27/2022   Lab Results  Component Value Date   HEPBSAB YES 06/12/2006   Lab Results  Component Value Date   LABRPR NON-REACTIVE 10/27/2022    CBC Lab Results  Component Value Date   WBC 6.0 10/27/2022   RBC 3.58 (L) 10/27/2022   HGB 11.0 (L) 10/27/2022   HCT 32.6 (L) 10/27/2022   PLT 368 10/27/2022   MCV 91.1 10/27/2022   MCH 30.7 10/27/2022   MCHC 33.7 10/27/2022   RDW 12.8 10/27/2022   LYMPHSABS 2,148 10/27/2022   MONOABS 0.9 06/21/2022   EOSABS 162 10/27/2022    BMET Lab Results  Component Value Date   NA 137 10/27/2022   K 4.1 10/27/2022   CL 106 10/27/2022   CO2 25 10/27/2022   GLUCOSE 131 (H) 10/27/2022   BUN 9 10/27/2022   CREATININE 1.20 10/27/2022   CALCIUM 9.3 10/27/2022   GFRNONAA 53 (L) 06/21/2022   GFRAA 58 (L) 07/30/2020      Assessment and Plan  HIV Long-standing HIV infection, diagnosed 25 years ago. Currently on Symtuza with good adherence. Reports improvement in parotid swelling, likely related to HIV. Previous parotid gland biopsy was negative. Discussed potential need for imaging to evaluate residual swelling and possible infection. - Order HIV labs - Refill Symtuza  - Follow-up in 3 months or sooner if imaging results are concerning  General Health Maintenance Up-to-date on flu, COVID, and pneumonia vaccines. No history of colonoscopy. Weight stable, uses Ensure to supplement diet. Discussed importance of frequent snacking to increase caloric intake. Consider shingles vaccine in the future. - Refer for colonoscopy - Encourage frequent snacking to increase caloric intake - Consider shingles vaccine in the future  Follow-up - Schedule follow-up visit in 3 months.   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 Will do CT on neck if not improving over the next 3 months. Suspect still lymph node swelling from being off of abtx

## 2023-04-07 LAB — CBC WITH DIFFERENTIAL/PLATELET
Absolute Lymphocytes: 2613 {cells}/uL (ref 850–3900)
Absolute Monocytes: 403 {cells}/uL (ref 200–950)
Basophils Absolute: 42 {cells}/uL (ref 0–200)
Basophils Relative: 0.8 %
Eosinophils Absolute: 90 {cells}/uL (ref 15–500)
Eosinophils Relative: 1.7 %
HCT: 37.3 % — ABNORMAL LOW (ref 38.5–50.0)
Hemoglobin: 12.4 g/dL — ABNORMAL LOW (ref 13.2–17.1)
MCH: 30.1 pg (ref 27.0–33.0)
MCHC: 33.2 g/dL (ref 32.0–36.0)
MCV: 90.5 fL (ref 80.0–100.0)
MPV: 9.2 fL (ref 7.5–12.5)
Monocytes Relative: 7.6 %
Neutro Abs: 2152 {cells}/uL (ref 1500–7800)
Neutrophils Relative %: 40.6 %
Platelets: 337 10*3/uL (ref 140–400)
RBC: 4.12 10*6/uL — ABNORMAL LOW (ref 4.20–5.80)
RDW: 13.2 % (ref 11.0–15.0)
Total Lymphocyte: 49.3 %
WBC: 5.3 10*3/uL (ref 3.8–10.8)

## 2023-04-07 LAB — COMPLETE METABOLIC PANEL WITH GFR
AG Ratio: 0.7 (calc) — ABNORMAL LOW (ref 1.0–2.5)
ALT: 12 U/L (ref 9–46)
AST: 16 U/L (ref 10–35)
Albumin: 3.7 g/dL (ref 3.6–5.1)
Alkaline phosphatase (APISO): 80 U/L (ref 35–144)
BUN: 22 mg/dL (ref 7–25)
CO2: 27 mmol/L (ref 20–32)
Calcium: 9.8 mg/dL (ref 8.6–10.3)
Chloride: 102 mmol/L (ref 98–110)
Creat: 1.26 mg/dL (ref 0.70–1.35)
Globulin: 5 g/dL — ABNORMAL HIGH (ref 1.9–3.7)
Glucose, Bld: 141 mg/dL — ABNORMAL HIGH (ref 65–99)
Potassium: 4.2 mmol/L (ref 3.5–5.3)
Sodium: 137 mmol/L (ref 135–146)
Total Bilirubin: 0.3 mg/dL (ref 0.2–1.2)
Total Protein: 8.7 g/dL — ABNORMAL HIGH (ref 6.1–8.1)
eGFR: 63 mL/min/{1.73_m2} (ref >=60)

## 2023-04-07 LAB — HIV-1 RNA QUANT-NO REFLEX-BLD
HIV 1 RNA Quant: 238 {copies}/mL — ABNORMAL HIGH
HIV-1 RNA Quant, Log: 2.38 {Log} — ABNORMAL HIGH

## 2023-04-07 LAB — T-HELPER CELLS (CD4) COUNT (NOT AT ARMC)
Absolute CD4: 368 {cells}/uL — ABNORMAL LOW (ref 490–1740)
CD4 T Helper %: 16 % — ABNORMAL LOW (ref 30–61)
Total lymphocyte count: 2281 {cells}/uL (ref 850–3900)

## 2023-04-07 LAB — RPR: RPR Ser Ql: NONREACTIVE

## 2023-06-20 ENCOUNTER — Ambulatory Visit (HOSPITAL_COMMUNITY): Payer: Medicare Other

## 2023-06-27 ENCOUNTER — Ambulatory Visit (HOSPITAL_COMMUNITY): Admission: RE | Admit: 2023-06-27 | Source: Ambulatory Visit

## 2023-07-04 ENCOUNTER — Other Ambulatory Visit (HOSPITAL_COMMUNITY): Payer: Self-pay

## 2023-07-04 ENCOUNTER — Ambulatory Visit (HOSPITAL_COMMUNITY): Admission: RE | Admit: 2023-07-04 | Source: Ambulatory Visit

## 2023-07-05 ENCOUNTER — Encounter: Payer: Self-pay | Admitting: Internal Medicine

## 2023-07-05 ENCOUNTER — Ambulatory Visit: Payer: Self-pay | Admitting: Internal Medicine

## 2023-07-10 ENCOUNTER — Telehealth: Payer: Self-pay

## 2023-07-10 ENCOUNTER — Other Ambulatory Visit (HOSPITAL_COMMUNITY): Payer: Self-pay

## 2023-07-10 NOTE — Telephone Encounter (Signed)
 [facility] Patient Advocate Encounter  Insurance verification completed.    The patient is insured through  [employer]  .     Ran test claim for [facility]  Medication will need a PA. PATIENT ON THIS MED CURRENTLY   Ran test claim for BIKTARVY  Medication will need a PA.  Ran test claim for DOVATO   Medication will need a PA.   We will continue to follow to see if copay assistance is needed.  This test claim was processed through [facility]- copay amounts may vary at other pharmacies due to pharmacy/plan contracts, or as the patient moves through the different stages of their insurance plan.

## 2023-07-14 ENCOUNTER — Telehealth: Payer: Self-pay

## 2023-07-14 NOTE — Telephone Encounter (Signed)
 Detectable Viral Load Intervention (DVL)  Most recent VL:  HIV 1 RNA Quant  Date Value Ref Range Status  04/05/2023 238 (H) Copies/mL Final  10/27/2022 172 (H) Copies/mL Final  05/23/2022 196 (H) Copies/mL Final    Last Clinic Visit: 02/14/23  Current ART regimen: Symtuza  Appointment status: patient has future appointment scheduled  Medication last dispensed (per chart review):   Dispensed Days Supply Quantity Provider Pharmacy  [facility] TABLETS 05/17/2023 30 30 each Judyann Munson, MD [facility]  SYMTUZA TABLETS 04/18/2023 30 30 each Judyann Munson, MD [facility]    Medication Adherence  Not able to assess   Barriers to Care   Not able to assess   Interventions   Called patient to discuss medication adherence and possible barriers to care. Left vm.  Juanita Laster, RMA

## 2023-07-17 ENCOUNTER — Ambulatory Visit: Admitting: Internal Medicine

## 2023-08-31 NOTE — Progress Notes (Signed)
 The 10-year ASCVD risk score (Arnett DK, et al., 2019) is: 26.4%   Values used to calculate the score:     Age: 68 years     Sex: Male     Is Non-Hispanic African American: No     Diabetic: No     Tobacco smoker: Yes     Systolic Blood Pressure: 133 mmHg     Is BP treated: Yes     HDL Cholesterol: 49 mg/dL     Total Cholesterol: 258 mg/dL  Currently prescribed rosuvastatin  10 mg.   Chapin Arduini, BSN, RN

## 2023-12-27 ENCOUNTER — Ambulatory Visit

## 2023-12-27 ENCOUNTER — Other Ambulatory Visit: Payer: Self-pay

## 2023-12-27 ENCOUNTER — Ambulatory Visit: Admitting: Internal Medicine

## 2023-12-27 ENCOUNTER — Encounter: Payer: Self-pay | Admitting: Internal Medicine

## 2023-12-27 ENCOUNTER — Other Ambulatory Visit (HOSPITAL_COMMUNITY): Payer: Self-pay

## 2023-12-27 VITALS — BP 102/66 | HR 101 | Temp 98.0°F | Ht 71.0 in | Wt 164.0 lb

## 2023-12-27 DIAGNOSIS — R221 Localized swelling, mass and lump, neck: Secondary | ICD-10-CM | POA: Diagnosis not present

## 2023-12-27 DIAGNOSIS — Z21 Asymptomatic human immunodeficiency virus [HIV] infection status: Secondary | ICD-10-CM

## 2023-12-27 DIAGNOSIS — Z23 Encounter for immunization: Secondary | ICD-10-CM | POA: Diagnosis not present

## 2023-12-27 DIAGNOSIS — R21 Rash and other nonspecific skin eruption: Secondary | ICD-10-CM

## 2023-12-27 DIAGNOSIS — Z79899 Other long term (current) drug therapy: Secondary | ICD-10-CM | POA: Diagnosis not present

## 2023-12-27 NOTE — Progress Notes (Signed)
 RFV: follow up for hiv disease - 6 month Patient ID: [name], male   DOB: [birth year], 68 y.o.   MRN: [id number]  HPI [name] is a 68yo M with hiv disease, CD 4 count of 592/VL 238 in dec 2024, currently on symtuza . He reports that he  Has full body rash - 2 weeks ago, mildly itchy -- using new detergent; he reports no new sexual partners  Out of meds for 6 wk- since he was told that he needs to pay 120 a month even with his medicare coverage. He has financial constraints, and thus not able to get his medications.   Outpatient Encounter Medications as of 12/27/2023  Medication Sig   amLODipine  (NORVASC ) 10 MG tablet TAKE 1 TABLET(10 MG) BY MOUTH DAILY   Darunavir-Cobicistat-Emtricitabine -Tenofovir  Alafenamide (SYMTUZA ) 800-150-200-10 MG TABS Take 1 tablet by mouth daily with breakfast.   hydrochlorothiazide  (HYDRODIURIL ) 25 MG tablet TAKE 1 TABLET(25 MG) BY MOUTH DAILY   ondansetron  (ZOFRAN -ODT) 4 MG disintegrating tablet 4mg  ODT q4 hours prn nausea/vomit   pantoprazole  (PROTONIX ) 20 MG tablet Take 1 tablet (20 mg total) by mouth daily.   rosuvastatin  (CRESTOR ) 10 MG tablet Take 1 tablet (10 mg total) by mouth daily. (Patient not taking: Reported on 12/27/2023)   No facility-administered encounter medications on file as of 12/27/2023.     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   Perirectal abscess 01/26/2017   Diarrhea 01/12/2017   Genital herpes 03/12/2014   Penile wart 03/12/2014   Urethritis, nongonococcal 03/06/2013   Hyperlipidemia 05/02/2007   WEIGHT LOSS 08/30/2006   HIV (human immunodeficiency virus infection) (HCC) 05/30/2006   Essential hypertension 05/30/2006   Dental caries 05/30/2006   METHICILLIN RESISTANT STAPHYLOCOCCUS AUREUS INFECTION 10/31/2004     Health Maintenance Due  Topic Date Due   Medicare Annual Wellness (AWV)  Never done   Zoster Vaccines- Shingrix (1 of 2) Never done   Colonoscopy  Never done    Influenza Vaccine  11/17/2023   COVID-19 Vaccine (4 - Mixed Product risk 2024-25 season) 12/18/2023     Review of Systems Rash per above, 12 point ros is otherwise negative Physical Exam   BP 102/66   Pulse (!) 101   Temp 98 F (36.7 C) (Temporal)   Ht 5' 11 (1.803 m)   Wt 164 lb (74.4 kg)   SpO2 99%   BMI 22.87 kg/m   Physical Exam  Constitutional: He is oriented to person, place, and time. He appears well-developed and well-nourished. No distress.  HENT:  Mouth/Throat: Oropharynx is clear and moist. No oropharyngeal exudate.  Cardiovascular: Normal rate, regular rhythm and normal heart sounds. Exam reveals no gallop and no friction rub.  No murmur heard.  Pulmonary/Chest: Effort normal and breath sounds normal. No respiratory distress. He has no wheezes.  Abdominal: Soft. Bowel sounds are normal. He exhibits no distension. There is no tenderness.  Lymphadenopathy:  He has no cervical adenopathy.  Neurological: He is alert and oriented to person, place, and time.  Skin: Skin is warm and dry. Diffuse maculopapular rash extremities  and torso. Sparing palms Psychiatric: He has a normal mood and affect. His behavior is normal.   Lab Results  Component Value Date   CD4TCELL 16 (L) 04/05/2023   Lab Results  Component Value Date   CD4TABS 592 10/27/2022   CD4TABS 743 05/23/2022   CD4TABS 728 07/30/2020   Lab Results  Component Value Date   HIV1RNAQUANT 238 (  H) 04/05/2023   Lab Results  Component Value Date   HEPBSAB YES 06/12/2006   Lab Results  Component Value Date   LABRPR NON-REACTIVE 04/05/2023    CBC Lab Results  Component Value Date   WBC 5.3 04/05/2023   RBC 4.12 (L) 04/05/2023   HGB 12.4 (L) 04/05/2023   HCT 37.3 (L) 04/05/2023   PLT 337 04/05/2023   MCV 90.5 04/05/2023   MCH 30.1 04/05/2023   MCHC 33.2 04/05/2023   RDW 13.2 04/05/2023   LYMPHSABS 2,148 10/27/2022   MONOABS 0.9 06/21/2022   EOSABS 90 04/05/2023    BMET Lab Results   Component Value Date   NA 137 04/05/2023   K 4.2 04/05/2023   CL 102 04/05/2023   CO2 27 04/05/2023   GLUCOSE 141 (H) 04/05/2023   BUN 22 04/05/2023   CREATININE 1.26 04/05/2023   CALCIUM  9.8 04/05/2023   GFRNONAA 53 (L) 06/21/2022   GFRAA 58 (L) 07/30/2020      Assessment and Plan HIV disease= will get labs to see what his viral load and CD 4 count. We will add genotype since concern that his intermittent adherence and concern for mutations. Also we will have financial counselors to see why he has so much out of pocket cost. May need to consider switch to different plan as open enrolment is up.   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 New exposure. Will, check rpr and decide to treat depending on the results.  Health maintenance = will check lipids; will give flu vaccine today  Left neck mass = will get biopsy  Needs SSPAP renewal-insurnace coverage  Addendum: will give doxy 100mg  po bid x 28d

## 2023-12-28 ENCOUNTER — Other Ambulatory Visit: Payer: Self-pay | Admitting: Pharmacist

## 2023-12-28 DIAGNOSIS — Z21 Asymptomatic human immunodeficiency virus [HIV] infection status: Secondary | ICD-10-CM

## 2023-12-28 LAB — T-HELPER CELL (CD4) - (RCID CLINIC ONLY)
CD4 % Helper T Cell: 17 % — ABNORMAL LOW (ref 33–65)
CD4 T Cell Abs: 228 /uL — ABNORMAL LOW (ref 400–1790)

## 2023-12-28 MED ORDER — SYMTUZA 800-150-200-10 MG PO TABS: 1.0000 | ORAL_TABLET | Freq: Every day | ORAL | Status: DC

## 2023-12-28 NOTE — Progress Notes (Signed)
 Medication Samples have been provided to the patient.  Drug name: Symtuza         Strength: 800/150/200/10 mg Qty: 30  Tablets (1 bottles) LOT: [id number]   Exp.Date: 01/16/24  Samples requested by Dr. Luiz.  Dosing instructions: Take one tablet by mouth once daily with food  The patient has been instructed regarding the correct time, dose, and frequency of taking this medication, including desired effects and most common side effects.   Alan Geralds, PharmD, CPP, BCIDP, AAHIVP Clinical Pharmacist Practitioner Infectious Diseases Clinical Pharmacist [facility]

## 2023-12-30 LAB — COMPLETE METABOLIC PANEL WITHOUT GFR
AG Ratio: 0.7 (calc) — ABNORMAL LOW (ref 1.0–2.5)
ALT: 12 U/L (ref 9–46)
AST: 20 U/L (ref 10–35)
Albumin: 3.7 g/dL (ref 3.6–5.1)
Alkaline phosphatase (APISO): 94 U/L (ref 35–144)
BUN/Creatinine Ratio: 12 (calc) (ref 6–22)
BUN: 20 mg/dL (ref 7–25)
CO2: 27 mmol/L (ref 20–32)
Calcium: 9.7 mg/dL (ref 8.6–10.3)
Chloride: 100 mmol/L (ref 98–110)
Creat: 1.71 mg/dL — ABNORMAL HIGH (ref 0.70–1.35)
Globulin: 5.2 g/dL — ABNORMAL HIGH (ref 1.9–3.7)
Glucose, Bld: 114 mg/dL — ABNORMAL HIGH (ref 65–99)
Potassium: 4.9 mmol/L (ref 3.5–5.3)
Sodium: 136 mmol/L (ref 135–146)
Total Bilirubin: 0.3 mg/dL (ref 0.2–1.2)
Total Protein: 8.9 g/dL — ABNORMAL HIGH (ref 6.1–8.1)

## 2023-12-30 LAB — LIPID PANEL
Cholesterol: 181 mg/dL (ref <200)
HDL: 30 mg/dL — ABNORMAL LOW (ref >=40)
LDL Cholesterol (Calc): 124 mg/dL — ABNORMAL HIGH
Non-HDL Cholesterol (Calc): 151 mg/dL — ABNORMAL HIGH (ref <130)
Total CHOL/HDL Ratio: 6 (calc) — ABNORMAL HIGH (ref <5.0)
Triglycerides: 154 mg/dL — ABNORMAL HIGH (ref <150)

## 2023-12-30 LAB — HIV-1 RNA QUANT-NO REFLEX-BLD
HIV 1 RNA Quant: 97300 {copies}/mL — ABNORMAL HIGH
HIV-1 RNA Quant, Log: 4.99 {Log_copies}/mL — ABNORMAL HIGH

## 2023-12-30 LAB — CBC WITH DIFFERENTIAL/PLATELET
Absolute Lymphocytes: 1695 {cells}/uL (ref 850–3900)
Absolute Monocytes: 440 {cells}/uL (ref 200–950)
Basophils Absolute: 20 {cells}/uL (ref 0–200)
Basophils Relative: 0.4 %
Eosinophils Absolute: 10 {cells}/uL — ABNORMAL LOW (ref 15–500)
Eosinophils Relative: 0.2 %
HCT: 37.4 % — ABNORMAL LOW (ref 38.5–50.0)
Hemoglobin: 12.8 g/dL — ABNORMAL LOW (ref 13.2–17.1)
MCH: 31.1 pg (ref 27.0–33.0)
MCHC: 34.2 g/dL (ref 32.0–36.0)
MCV: 91 fL (ref 80.0–100.0)
MPV: 9.2 fL (ref 7.5–12.5)
Monocytes Relative: 8.8 %
Neutro Abs: 2835 {cells}/uL (ref 1500–7800)
Neutrophils Relative %: 56.7 %
Platelets: 345 Thousand/uL (ref 140–400)
RBC: 4.11 Million/uL — ABNORMAL LOW (ref 4.20–5.80)
RDW: 12.2 % (ref 11.0–15.0)
Total Lymphocyte: 33.9 %
WBC: 5 Thousand/uL (ref 3.8–10.8)

## 2023-12-30 LAB — T PALLIDUM AB: T Pallidum Abs: POSITIVE — AB

## 2023-12-30 LAB — RPR TITER: RPR Titer: 1:128 {titer} — ABNORMAL HIGH

## 2023-12-30 LAB — RPR: RPR Ser Ql: REACTIVE — AB

## 2024-01-02 ENCOUNTER — Other Ambulatory Visit

## 2024-01-07 MED ORDER — DOXYCYCLINE HYCLATE 100 MG PO TABS: 100.0000 mg | ORAL_TABLET | Freq: Two times a day (BID) | ORAL | 0 refills | Status: DC

## 2024-01-09 ENCOUNTER — Ambulatory Visit
Admission: RE | Admit: 2024-01-09 | Discharge: 2024-01-09 | Disposition: A | Discharge status: Home / Self Care | Source: Ambulatory Visit | Attending: Internal Medicine | Admitting: Internal Medicine

## 2024-01-09 DIAGNOSIS — R221 Localized swelling, mass and lump, neck: Secondary | ICD-10-CM

## 2024-01-09 MED ORDER — IOPAMIDOL (ISOVUE-300) INJECTION 61%
65.0000 mL | Freq: Once | INTRAVENOUS | Status: AC | PRN
Start: 2024-01-09 — End: 2024-01-09
  Administered 2024-01-09: 65 mL via INTRAVENOUS

## 2024-01-24 ENCOUNTER — Ambulatory Visit: Admitting: Internal Medicine

## 2024-01-26 ENCOUNTER — Other Ambulatory Visit: Payer: Self-pay

## 2024-01-26 ENCOUNTER — Ambulatory Visit: Admitting: Internal Medicine

## 2024-01-26 ENCOUNTER — Encounter: Payer: Self-pay | Admitting: Internal Medicine

## 2024-01-26 VITALS — BP 137/84 | HR 86 | Temp 98.2°F | Ht 71.0 in | Wt 164.0 lb

## 2024-01-26 DIAGNOSIS — Z21 Asymptomatic human immunodeficiency virus [HIV] infection status: Secondary | ICD-10-CM

## 2024-01-26 DIAGNOSIS — A5149 Other secondary syphilitic conditions: Secondary | ICD-10-CM | POA: Diagnosis not present

## 2024-01-26 DIAGNOSIS — Z91199 Patient's noncompliance with other medical treatment and regimen due to unspecified reason: Secondary | ICD-10-CM

## 2024-01-26 DIAGNOSIS — N183 Chronic kidney disease, stage 3 unspecified: Secondary | ICD-10-CM | POA: Diagnosis not present

## 2024-01-26 DIAGNOSIS — K1120 Sialoadenitis, unspecified: Secondary | ICD-10-CM | POA: Diagnosis not present

## 2024-01-26 MED ORDER — DOXYCYCLINE HYCLATE 100 MG PO TABS: 100.0000 mg | ORAL_TABLET | Freq: Two times a day (BID) | ORAL | 0 refills | Status: AC
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
## 2024-01-31 LAB — HIV-1 RNA QUANT-NO REFLEX-BLD
HIV 1 RNA Quant: 325 {copies}/mL — ABNORMAL HIGH
HIV-1 RNA Quant, Log: 2.51 {Log_copies}/mL — ABNORMAL HIGH

## 2024-02-23 ENCOUNTER — Telehealth: Payer: Self-pay

## 2024-02-23 NOTE — Telephone Encounter (Signed)
 Patient called requesting to refills on megace . Last refilled in 2018. Does not have a pcp who he can reach out regarding this medication. Denies losing weight.  If okay to refill would like script sent to [facility] on [address]. Lorenda CHRISTELLA Code, RMA

## 2024-02-29 ENCOUNTER — Other Ambulatory Visit: Payer: Self-pay | Admitting: Internal Medicine

## 2024-02-29 MED ORDER — MEGESTROL ACETATE 40 MG PO TABS
40.0000 mg | ORAL_TABLET | Freq: Every day | ORAL | 3 refills | Status: AC
Start: 2024-02-29 — End: ?

## 2024-02-29 NOTE — Telephone Encounter (Signed)
 Left vm updating pt.  Lorenda CHRISTELLA Code, RMA

## 2024-03-27 ENCOUNTER — Other Ambulatory Visit: Payer: Self-pay

## 2024-03-27 ENCOUNTER — Other Ambulatory Visit (HOSPITAL_COMMUNITY)
Admission: RE | Admit: 2024-03-27 | Discharge: 2024-03-27 | Disposition: A | Discharge status: Home / Self Care | Source: Ambulatory Visit | Attending: Internal Medicine | Admitting: Internal Medicine

## 2024-03-27 ENCOUNTER — Encounter: Payer: Self-pay | Admitting: Internal Medicine

## 2024-03-27 ENCOUNTER — Ambulatory Visit: Admitting: Internal Medicine

## 2024-03-27 VITALS — BP 134/88 | HR 84 | Temp 97.1°F | Ht 71.0 in | Wt 165.0 lb

## 2024-03-27 DIAGNOSIS — Z21 Asymptomatic human immunodeficiency virus [HIV] infection status: Secondary | ICD-10-CM | POA: Diagnosis present

## 2024-03-27 DIAGNOSIS — Z113 Encounter for screening for infections with a predominantly sexual mode of transmission: Secondary | ICD-10-CM | POA: Insufficient documentation

## 2024-03-27 DIAGNOSIS — K1120 Sialoadenitis, unspecified: Secondary | ICD-10-CM | POA: Diagnosis not present

## 2024-03-27 DIAGNOSIS — B20 Human immunodeficiency virus [HIV] disease: Secondary | ICD-10-CM | POA: Diagnosis not present

## 2024-03-27 DIAGNOSIS — A5149 Other secondary syphilitic conditions: Secondary | ICD-10-CM

## 2024-03-27 DIAGNOSIS — K118 Other diseases of salivary glands: Secondary | ICD-10-CM

## 2024-03-27 DIAGNOSIS — Z23 Encounter for immunization: Secondary | ICD-10-CM | POA: Diagnosis not present

## 2024-03-27 NOTE — Progress Notes (Unsigned)
 Patient ID: [name], male   DOB: November 29, 1955, 68 y.o.   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   doxycycline  (VIBRA -TABS) 100 MG tablet Take 1 tablet (100 mg total) by mouth 2 (two) times daily.   hydrochlorothiazide  (HYDRODIURIL ) 25 MG tablet TAKE 1 TABLET(25 MG) BY MOUTH DAILY   megestrol  (MEGACE ) 40 MG tablet Take 1 tablet (40 mg total) by mouth daily.   pantoprazole  (PROTONIX ) 20 MG tablet Take 1 tablet (20 mg total) by mouth daily.   ondansetron  (ZOFRAN -ODT) 4 MG disintegrating tablet 4mg  ODT q4 hours prn nausea/vomit (Patient not taking: Reported on 03/27/2024)   No facility-administered encounter medications on file as of 03/27/2024.     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   Perirectal abscess 01/26/2017   Diarrhea 01/12/2017   Genital herpes 03/12/2014   Penile wart 03/12/2014   Urethritis, nongonococcal 03/06/2013   Hyperlipidemia 05/02/2007   WEIGHT LOSS 08/30/2006   HIV (human immunodeficiency virus infection) (HCC) 05/30/2006   Essential hypertension 05/30/2006   Dental caries 05/30/2006   METHICILLIN RESISTANT STAPHYLOCOCCUS AUREUS INFECTION 10/31/2004     Health Maintenance Due  Topic Date Due   Medicare Annual Wellness (AWV)  Never done   Zoster Vaccines- Shingrix (1 of 2) Never done   Colonoscopy  Never done   COVID-19 Vaccine (4 - 2025-26 season) 12/18/2023     Review of Systems Review of Systems  Constitutional: Negative for fever, chills,  diaphoresis, activity change, appetite change, fatigue and unexpected weight change.  HENT: Negative for congestion, sore throat, rhinorrhea, sneezing, trouble swallowing and sinus pressure.  Eyes: Negative for photophobia and visual disturbance.  Respiratory: Negative for cough, chest tightness, shortness of breath, wheezing and stridor.  Cardiovascular: Negative for chest pain, palpitations and leg swelling.  Gastrointestinal: Negative for nausea, vomiting, abdominal pain, diarrhea, constipation, blood in stool, abdominal distention and anal bleeding.  Genitourinary: Negative for dysuria, hematuria, flank pain and difficulty urinating.  Musculoskeletal: Negative for myalgias, back pain, joint swelling, arthralgias and gait problem.  Skin: Negative for color change, pallor, rash and wound.  Neurological: Negative for dizziness, tremors, weakness and light-headedness.  Hematological: Negative for adenopathy. Does not bruise/bleed easily.  Psychiatric/Behavioral: Negative for behavioral problems, confusion, sleep disturbance, dysphoric mood, decreased concentration and agitation.   Physical Exam   BP 134/88   Pulse 84   Temp (!) 97.1 F (36.2 C) (Temporal)   Ht 5' 11 (1.803 m)   Wt 165 lb (74.8 kg)   SpO2 99%   BMI 23.01 kg/m   Physical Exam  Constitutional: He is oriented to person, place, and time. He appears well-developed and well-nourished. No distress.  HENT:  Mouth/Throat: Oropharynx is clear and moist. No oropharyngeal exudate.  Cardiovascular: Normal rate, regular rhythm and normal heart sounds. Exam reveals no gallop and no friction rub.  No murmur heard.  Pulmonary/Chest: Effort normal and breath sounds normal. No respiratory distress. He has no wheezes.  Abdominal: Soft. Bowel sounds are normal. He exhibits no distension. There is  no tenderness.  Lymphadenopathy:  He has no cervical adenopathy.  Neurological: He is alert and oriented to person, place, and time.  Skin:  Skin is warm and dry. No rash noted. No erythema.  Psychiatric: He has a normal mood and affect. His behavior is normal.   Lab Results  Component Value Date   CD4TCELL 17 (L) 12/27/2023   Lab Results  Component Value Date   CD4TABS 228 (L) 12/27/2023   CD4TABS 592 10/27/2022   CD4TABS 743 05/23/2022   Lab Results  Component Value Date   HIV1RNAQUANT 325 (H) 01/26/2024   Lab Results  Component Value Date   HEPBSAB YES 06/12/2006   Lab Results  Component Value Date   LABRPR REACTIVE (A) 12/27/2023    CBC Lab Results  Component Value Date   WBC 5.0 12/27/2023   RBC 4.11 (L) 12/27/2023   HGB 12.8 (L) 12/27/2023   HCT 37.4 (L) 12/27/2023   PLT 345 12/27/2023   MCV 91.0 12/27/2023   MCH 31.1 12/27/2023   MCHC 34.2 12/27/2023   RDW 12.2 12/27/2023   LYMPHSABS 2,148 10/27/2022   MONOABS 0.9 06/21/2022   EOSABS 10 (L) 12/27/2023    BMET Lab Results  Component Value Date   NA 136 12/27/2023   K 4.9 12/27/2023   CL 100 12/27/2023   CO2 27 12/27/2023   GLUCOSE 114 (H) 12/27/2023   BUN 20 12/27/2023   CREATININE 1.71 (H) 12/27/2023   CALCIUM  9.7 12/27/2023   GFRNONAA 53 (L) 06/21/2022   GFRAA 58 (L) 07/30/2020      Assessment and Plan  HIV disease= labs today. 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 Now back onto treatment. Would like to refer to ENT for evaluation. To send tissue for path, and AFB  Long weight = Continue with megace   Health maintenance = Covid vaccine

## 2024-03-27 NOTE — Patient Instructions (Signed)
 Smoking Cessation: QuitlineNC 1-800-QUIT-NOW [phone]); Espaol: [phone] ([phone]) [email or web address]

## 2024-03-28 LAB — URINE CYTOLOGY ANCILLARY ONLY
Chlamydia: NEGATIVE
Comment: NEGATIVE
Comment: NORMAL
Neisseria Gonorrhea: NEGATIVE

## 2024-03-30 LAB — CBC WITH DIFFERENTIAL/PLATELET
Absolute Lymphocytes: 2337 {cells}/uL (ref 850–3900)
Absolute Monocytes: 423 {cells}/uL (ref 200–950)
Basophils Absolute: 52 {cells}/uL (ref 0–200)
Basophils Relative: 0.9 %
Eosinophils Absolute: 52 {cells}/uL (ref 15–500)
Eosinophils Relative: 0.9 %
HCT: 35.7 % — ABNORMAL LOW (ref 39.4–51.1)
Hemoglobin: 11.9 g/dL — ABNORMAL LOW (ref 13.2–17.1)
MCH: 31 pg (ref 27.0–33.0)
MCHC: 33.3 g/dL (ref 31.6–35.4)
MCV: 93 fL (ref 81.4–101.7)
MPV: 9.1 fL (ref 7.5–12.5)
Monocytes Relative: 7.3 %
Neutro Abs: 2935 {cells}/uL (ref 1500–7800)
Neutrophils Relative %: 50.6 %
Platelets: 397 Thousand/uL (ref 140–400)
RBC: 3.84 Million/uL — ABNORMAL LOW (ref 4.20–5.80)
RDW: 14.5 % (ref 11.0–15.0)
Total Lymphocyte: 40.3 %
WBC: 5.8 Thousand/uL (ref 3.8–10.8)

## 2024-03-30 LAB — COMPLETE METABOLIC PANEL WITHOUT GFR
AG Ratio: 0.8 (calc) — ABNORMAL LOW (ref 1.0–2.5)
ALT: 11 U/L (ref 9–46)
AST: 13 U/L (ref 10–35)
Albumin: 3.8 g/dL (ref 3.6–5.1)
Alkaline phosphatase (APISO): 78 U/L (ref 35–144)
BUN/Creatinine Ratio: 11 (calc) (ref 6–22)
BUN: 16 mg/dL (ref 7–25)
CO2: 23 mmol/L (ref 20–32)
Calcium: 9.6 mg/dL (ref 8.6–10.3)
Chloride: 105 mmol/L (ref 98–110)
Creat: 1.48 mg/dL — ABNORMAL HIGH (ref 0.70–1.35)
Globulin: 4.5 g/dL — ABNORMAL HIGH (ref 1.9–3.7)
Glucose, Bld: 149 mg/dL — ABNORMAL HIGH (ref 65–99)
Potassium: 3.8 mmol/L (ref 3.5–5.3)
Sodium: 136 mmol/L (ref 135–146)
Total Bilirubin: 0.3 mg/dL (ref 0.2–1.2)
Total Protein: 8.3 g/dL — ABNORMAL HIGH (ref 6.1–8.1)

## 2024-03-30 LAB — HIV-1 RNA QUANT-NO REFLEX-BLD
HIV 1 RNA Quant: <20 {copies}/mL — AB
HIV-1 RNA Quant, Log: <1.3 {Log_copies}/mL — AB

## 2024-03-30 LAB — RPR TITER: RPR Titer: 1:32 {titer} — ABNORMAL HIGH

## 2024-03-30 LAB — SYPHILIS: RPR W/REFLEX TO RPR TITER AND TREPONEMAL ANTIBODIES, TRADITIONAL SCREENING AND DIAGNOSIS ALGORITHM: RPR Ser Ql: REACTIVE — AB

## 2024-03-30 LAB — T PALLIDUM AB: T Pallidum Abs: POSITIVE — AB

## 2024-04-24 ENCOUNTER — Other Ambulatory Visit: Payer: Self-pay | Admitting: Internal Medicine

## 2024-04-24 DIAGNOSIS — I10 Essential (primary) hypertension: Secondary | ICD-10-CM

## 2024-06-26 ENCOUNTER — Ambulatory Visit: Admitting: Internal Medicine
